# Patient Record
Sex: Male | Born: 1965 | Race: White | Hispanic: No | Marital: Married | Smoking: Never smoker
Health system: Southern US, Community
[De-identification: ages and names within clinical notes are randomized; demographics above are authoritative.]

## PROBLEM LIST (undated history)

## (undated) DIAGNOSIS — R945 Abnormal results of liver function studies: Secondary | ICD-10-CM

## (undated) DIAGNOSIS — J810 Acute pulmonary edema: Secondary | ICD-10-CM

## (undated) DIAGNOSIS — G4730 Sleep apnea, unspecified: Secondary | ICD-10-CM

## (undated) DIAGNOSIS — I10 Essential (primary) hypertension: Secondary | ICD-10-CM

## (undated) DIAGNOSIS — M1990 Unspecified osteoarthritis, unspecified site: Secondary | ICD-10-CM

## (undated) DIAGNOSIS — T7840XA Allergy, unspecified, initial encounter: Secondary | ICD-10-CM

## (undated) DIAGNOSIS — L03113 Cellulitis of right upper limb: Secondary | ICD-10-CM

## (undated) DIAGNOSIS — R0902 Hypoxemia: Secondary | ICD-10-CM

## (undated) DIAGNOSIS — E669 Obesity, unspecified: Secondary | ICD-10-CM

## (undated) HISTORY — PX: BUNIONECTOMY: SHX129

## (undated) HISTORY — DX: Cellulitis of right upper limb: L03.113

## (undated) HISTORY — DX: Sleep apnea, unspecified: G47.30

## (undated) HISTORY — DX: Abnormal results of liver function studies: R94.5

## (undated) HISTORY — DX: Essential (primary) hypertension: I10

## (undated) HISTORY — DX: Acute pulmonary edema: J81.0

## (undated) HISTORY — DX: Allergy, unspecified, initial encounter: T78.40XA

## (undated) HISTORY — DX: Hypoxemia: R09.02

---

## 2012-04-12 ENCOUNTER — Institutional Professional Consult (permissible substitution): Payer: Self-pay | Admitting: Pulmonary Disease

## 2012-05-03 ENCOUNTER — Ambulatory Visit (INDEPENDENT_AMBULATORY_CARE_PROVIDER_SITE_OTHER): Payer: BC Managed Care – PPO | Admitting: Pulmonary Disease

## 2012-05-03 ENCOUNTER — Encounter: Payer: Self-pay | Admitting: Pulmonary Disease

## 2012-05-03 VITALS — BP 124/76 | HR 87 | Temp 98.2°F | Ht 71.0 in | Wt 377.4 lb

## 2012-05-03 DIAGNOSIS — G4733 Obstructive sleep apnea (adult) (pediatric): Secondary | ICD-10-CM

## 2012-05-03 HISTORY — DX: Obstructive sleep apnea (adult) (pediatric): G47.33

## 2012-05-03 NOTE — Progress Notes (Signed)
Subjective:    Patient ID: Jeffery Simon, male    DOB: [birth year], 47 y.o.   MRN: [id number]  HPI The patient is a 47 year old male who I been asked to see for management of obstructive sleep apnea.  The patient has had a recent sleep study in [place] of this [place] which apparently showed severe disease.  The study is not available for my review currently.  He was started on CPAP, and he tells me that he is currently on the automatic setting.  He uses a full face mask, and has heated humidity.  He feels that his CPAP is very comfortable, and denies having any tolerance issues.  He also uses supplemental oxygen through his device while sleeping.  Since wearing CPAP, the patient states that his sleep is much improved, and he feels rested in the mornings upon arising.  His inappropriate daytime sleepiness is significantly improved, but has not totally resolved.  The patient denies any sleepiness while driving.  He states that his weight is down about 25 pounds over the last few months, but is up 40 pounds overall from 2 years ago.  His Epworth score today is only 3.  Sleep Questionnaire What time do you typically go to bed?( Between what hours) 9-10p 9-10p at 1535 on 05/03/12 by Nita Sells, CMA How long does it take you to fall asleep? 5-10 mins 5-10 mins at 1535 on 05/03/12 by Nita Sells, CMA How many times during the night do you wake up? 0 0 at 1535 on 05/03/12 by Nita Sells, CMA What time do you get out of bed to start your day? 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 (In pounds) 40 lb (18.144 kg)40 lb (18.144 kg) increase at 1535 on 05/03/12 by Nita Sells, CMA Have you ever had a sleep study before? Yes Yes at 1535 on 05/03/12 by Nita Sells, CMA If yes, location  of [facility]? [facility] at 1535 on 05/03/12 by Nita Sells, CMA If yes, date of study? 03/15/12 03/15/12 at 1535 on 05/03/12 by Nita Sells, CMA Do you currently use CPAP? Yes Yes at 1535 on 05/03/12 by Nita Sells, CMA If so, what pressure? btw 12-16--avg 14 btw 12-16--avg 14 at 1535 on 05/03/12 by Marjo Bicker Mabe, CMA Do you wear oxygen at any time? Yes Yes at 1535 on 05/03/12 by Marjo Bicker Mabe, CMA O2 Flow Rate (L/min) 3.5 L/min3.5 L/min bled into CPAP at 1535 on 05/03/12 by Marjo Bicker Mabe, CMA    Review of Systems  Constitutional: Negative for fever and unexpected weight change.  HENT: Negative for ear pain, nosebleeds, congestion, sore throat, rhinorrhea, sneezing, trouble swallowing, dental problem, postnasal drip and sinus pressure.   Eyes: Negative for redness and itching.  Respiratory: Negative for cough, chest tightness, shortness of breath and wheezing.   Cardiovascular: Negative for palpitations and leg swelling.  Gastrointestinal: Negative for nausea and vomiting.  Genitourinary: Negative for dysuria.  Musculoskeletal: Positive for joint swelling.  Skin: Negative for rash.  Neurological: Negative for headaches.  Hematological: Does not bruise/bleed easily.  Psychiatric/Behavioral: Negative for dysphoric mood. The patient is not nervous/anxious.        Objective:   Physical Exam Constitutional:  Morbidly obese male, no acute distress  HENT:  Nares patent without discharge  Oropharynx without exudate, palate and uvula are mildly elongated, large neck  Eyes:  Perrla, eomi, no scleral icterus  Neck:  No JVD, no TMG  Cardiovascular:  Normal rate, regular rhythm, no rubs or gallops.  No murmurs        Intact distal pulses  Pulmonary :  Normal breath sounds, no stridor or respiratory distress   No rales, rhonchi, or wheezing  Abdominal:  Soft, nondistended, bowel sounds present.  No tenderness noted.   Musculoskeletal:  1+ lower  extremity edema noted.  Lymph Nodes:  No cervical lymphadenopathy noted  Skin:  No cyanosis noted  Neurologic:  Alert, appropriate, moves all 4 extremities without obvious deficit.         Assessment & Plan:

## 2012-05-03 NOTE — Patient Instructions (Addendum)
Stay on cpap, keep up with mask changes and supplies. Work on weight loss Will check your oxygen level on cpap but off oxygen to see if you still need it at night while sleeping. followup with me in 6mos.

## 2012-05-03 NOTE — Assessment & Plan Note (Signed)
The patient has a history of significant sleep apnea from a recent sleep study, although the data is currently not available to me.  The patient has been started on CPAP with supplemental oxygen at night, and has seen significant improvement in his sleep and how he feels next day.  He tells me that he has also lost significant weight over the last few months since being on CPAP.  At this point, I have asked him to continue on CPAP, would like to get a download of his device to make sure that we are adequately controlling his sleep apnea, and we'll also check overnight oximetry on CPAP but off oxygen to make sure that he still requires this.  I have also encouraged him to work aggressively on weight loss, and to keep up with mask changes and supplies.

## 2012-05-09 ENCOUNTER — Encounter: Payer: Self-pay | Admitting: Pulmonary Disease

## 2012-05-09 ENCOUNTER — Telehealth: Payer: Self-pay | Admitting: *Deleted

## 2012-05-09 NOTE — Telephone Encounter (Signed)
LMOM x 1 

## 2012-05-09 NOTE — Telephone Encounter (Signed)
Please make sure with pt that ONO was done ON CPAP and OFF oxygen If so, let her know that her oxygen levels still fell, and will need to stay on oxygen thru her cpap machine.

## 2012-05-09 NOTE — Telephone Encounter (Signed)
ONO results have been received and placed in [facility] Brutus folder for review. Please advise, thanks.   

## 2012-05-10 NOTE — Telephone Encounter (Signed)
LMOM x 3  Due to patient being hard to contact--playing phone tag, please explain the results below (if I am unavailable to answer patients' call) I have been unable to reach him, Thanks.

## 2012-05-10 NOTE — Telephone Encounter (Signed)
Patient aware of results per [facility].  40mo recall has been entered for patient.

## 2012-05-10 NOTE — Telephone Encounter (Signed)
Patient wanted to know values from the sleep study... Lowest sat 55% Pt spent 2:12hr below 88%  Pt aware.

## 2012-05-10 NOTE — Telephone Encounter (Signed)
LMOM x 2 

## 2012-05-10 NOTE — Telephone Encounter (Signed)
Pt returned Jeffery Simon's call. [name]

## 2012-05-10 NOTE — Telephone Encounter (Signed)
Pt returned Ashtyn's call & can be reached at [phone].  Jeffery Simon

## 2012-05-21 ENCOUNTER — Encounter: Payer: Self-pay | Admitting: Pulmonary Disease

## 2012-06-05 ENCOUNTER — Encounter: Payer: Self-pay | Admitting: Pulmonary Disease

## 2014-12-25 ENCOUNTER — Other Ambulatory Visit: Payer: Self-pay | Admitting: Orthopedic Surgery

## 2014-12-25 DIAGNOSIS — M79671 Pain in right foot: Secondary | ICD-10-CM

## 2015-01-03 ENCOUNTER — Ambulatory Visit
Admission: RE | Admit: 2015-01-03 | Discharge: 2015-01-03 | Disposition: A | Discharge status: Home / Self Care | Payer: 59 | Source: Ambulatory Visit | Attending: Orthopedic Surgery | Admitting: Orthopedic Surgery

## 2015-01-03 DIAGNOSIS — M79671 Pain in right foot: Secondary | ICD-10-CM

## 2015-07-14 DIAGNOSIS — I10 Essential (primary) hypertension: Secondary | ICD-10-CM | POA: Diagnosis not present

## 2015-07-14 DIAGNOSIS — R945 Abnormal results of liver function studies: Secondary | ICD-10-CM | POA: Diagnosis not present

## 2015-07-25 DIAGNOSIS — R945 Abnormal results of liver function studies: Secondary | ICD-10-CM | POA: Diagnosis not present

## 2015-07-25 DIAGNOSIS — K760 Fatty (change of) liver, not elsewhere classified: Secondary | ICD-10-CM | POA: Diagnosis not present

## 2016-01-15 DIAGNOSIS — I868 Varicose veins of other specified sites: Secondary | ICD-10-CM | POA: Diagnosis not present

## 2016-01-15 DIAGNOSIS — I8312 Varicose veins of left lower extremity with inflammation: Secondary | ICD-10-CM | POA: Diagnosis not present

## 2016-01-15 DIAGNOSIS — I10 Essential (primary) hypertension: Secondary | ICD-10-CM | POA: Diagnosis not present

## 2016-01-15 DIAGNOSIS — J069 Acute upper respiratory infection, unspecified: Secondary | ICD-10-CM | POA: Diagnosis not present

## 2016-01-15 DIAGNOSIS — I83008 Varicose veins of unspecified lower extremity with ulcer other part of lower leg: Secondary | ICD-10-CM | POA: Diagnosis not present

## 2016-02-02 ENCOUNTER — Other Ambulatory Visit: Payer: Self-pay

## 2016-02-03 ENCOUNTER — Encounter: Payer: Self-pay | Admitting: Vascular Surgery

## 2016-02-03 ENCOUNTER — Other Ambulatory Visit: Payer: Self-pay | Admitting: *Deleted

## 2016-02-03 ENCOUNTER — Ambulatory Visit (INDEPENDENT_AMBULATORY_CARE_PROVIDER_SITE_OTHER): Payer: BLUE CROSS/BLUE SHIELD | Admitting: Vascular Surgery

## 2016-02-03 ENCOUNTER — Ambulatory Visit (HOSPITAL_COMMUNITY)
Admission: RE | Admit: 2016-02-03 | Discharge: 2016-02-03 | Disposition: A | Discharge status: Home / Self Care | Payer: BLUE CROSS/BLUE SHIELD | Source: Ambulatory Visit | Attending: Vascular Surgery | Admitting: Vascular Surgery

## 2016-02-03 VITALS — BP 176/104 | HR 84 | Temp 98.0°F | Resp 18 | Ht 71.0 in | Wt 386.6 lb

## 2016-02-03 DIAGNOSIS — I83892 Varicose veins of left lower extremities with other complications: Secondary | ICD-10-CM

## 2016-02-03 DIAGNOSIS — I83893 Varicose veins of bilateral lower extremities with other complications: Secondary | ICD-10-CM | POA: Diagnosis not present

## 2016-02-03 HISTORY — DX: Varicose veins of bilateral lower extremities with other complications: I83.893

## 2016-02-03 NOTE — Progress Notes (Signed)
Subjective:     Patient ID: Jeffery Simon, male   DOB: 04-11-1965, 51 y.o.   MRN: [id number]  HPI This 51 year old male was referred by Dr. Burnett Sheng for evaluation of bilateral varicose veins with a history of bleeding in the left ankle on 3 occasions. The patient has had 3 episodes of bleeding near the medial aspect of the left ankle where the skin is very thickened and eroded and he has small varicose veins. Most recent week before Christmas 2017 and he required a trip to the urgent care because of persistent oozing through the dressing. He has no history of DVT thrombophlebitis or stasis ulcers. He does have chronic edema in both ankles which worsens as the day progresses. He does try short leg elastic compression stockings frequently to assist in the swelling. His symptoms are affecting his daily living and ability to work.  Past Medical History:  Diagnosis Date  . HTN (hypertension)   . Sleep apnea     Social History  Substance Use Topics  . Smoking status: Never Smoker  . Smokeless tobacco: Never Used     Comment: cigars occ  . Alcohol use Yes     Comment: 2-3 beers monthly    Family History  Problem Relation Age of Onset  . Hypertension Mother   . Hypertension Father     Not on File   Current Outpatient Prescriptions:  .  diclofenac (VOLTAREN) 75 MG EC tablet, Take 75 mg by mouth 2 (two) times daily., Disp: , Rfl:  .  fish oil-omega-3 fatty acids 1000 MG capsule, Take 1 g by mouth daily., Disp: , Rfl:  .  GARLIC PO, Take 1 tablet by mouth daily., Disp: , Rfl:  .  hydrochlorothiazide (HYDRODIURIL) 25 MG tablet, Take 25 mg by mouth daily., Disp: , Rfl:  .  MULTIPLE VITAMIN PO, Take 1 tablet by mouth daily., Disp: , Rfl:   Vitals:   02/03/16 1003 02/03/16 1006  BP: (!) 160/104 (!) 176/104  Pulse: 84   Resp: 18   Temp: 98 F (36.7 C)   TempSrc: Oral   SpO2: 94%   Weight: (!) 386 lb 9.6 oz (175.4 kg)   Height: 5\' 11"  (1.803 m)     Body mass index is 53.92  kg/m.         Review of Systems Denies chest pain but does have dyspnea on exertion. Denies claudication. Able to ambulate long distances. Has chronic obesity and hypertension. Also has sleep apnea. Other systems negative and complete review of systems    Objective:   Physical Exam BP (!) 176/104 (BP Location: Left Arm, Patient Position: Sitting, Cuff Size: Normal) Comment: recheck  Pulse 84   Temp 98 F (36.7 C) (Oral)   Resp 18   Ht 5\' 11"  (1.803 m)   Wt (!) 386 lb 9.6 oz (175.4 kg)   SpO2 94%   BMI 53.92 kg/m     Gen.-alert and oriented x3 in no apparent distress-morbidly obese HEENT normal for age Lungs no rhonchi or wheezing Cardiovascular regular rhythm no murmurs carotid pulses 3+ palpable no bruits audible Abdomen soft nontender no palpable masses-morbidly obese Musculoskeletal free of  major deformities Skin clear -no rashes Neurologic normal Lower extremities 3+ femoral and dorsalis pedis pulses palpable bilaterally with bilateral 1+ edema. Hyperpigmentation medial aspect lower third left leg with erosion of the skin near reticular veins adjacent to left medial malleolus. Bulging varicosities medial calf and thigh bilaterally. No ulceration on the right side. 3+  dorsalis pedis pulse palpable bilaterally.  Today I performed a bedside SonoSite ultrasound exam which reveals gross reflux through enlarged great saphenous veins throughout  Formal venous duplex exam was also performed today which confirms the above findings with bilateral gross reflux great saphenous veins which are enlarged       Assessment:     Bilateral pain and swelling due to gross reflux great saphenous veins with history of bleeding left ankle varicosity on 3 occasions (most recently 2 weeks ago)-requiring trip to the emergency department Because of patient's morbid obesity unable to wear long leg elastic compression stockings-patient has worn short leg compression stockings    Plan:      Patient needs #1 laser ablation left great saphenous vein followed by #2 laser ablation right great saphenous vein  We will proceed with precertification to perform this in the near future-will request that insurance company wave waiting period for left leg because of multiple bleeding episodes

## 2016-02-09 ENCOUNTER — Encounter: Payer: 59 | Admitting: Vascular Surgery

## 2016-02-12 ENCOUNTER — Other Ambulatory Visit: Payer: Self-pay | Admitting: *Deleted

## 2016-02-12 DIAGNOSIS — I83893 Varicose veins of bilateral lower extremities with other complications: Secondary | ICD-10-CM

## 2016-02-16 ENCOUNTER — Encounter: Payer: Self-pay | Admitting: Vascular Surgery

## 2016-02-16 ENCOUNTER — Ambulatory Visit (INDEPENDENT_AMBULATORY_CARE_PROVIDER_SITE_OTHER): Payer: BLUE CROSS/BLUE SHIELD | Admitting: Vascular Surgery

## 2016-02-16 VITALS — BP 149/87 | HR 83 | Resp 18 | Ht 69.0 in | Wt 385.0 lb

## 2016-02-16 DIAGNOSIS — I83892 Varicose veins of left lower extremities with other complications: Secondary | ICD-10-CM

## 2016-02-16 NOTE — Progress Notes (Signed)
Vitals:   02/16/16 1403  BP: (!) 145/90  Pulse: 83  Resp: 18  SpO2: 91%  Weight: (!) 385 lb (174.6 kg)  Height: 5\' 9"  (1.753 m)

## 2016-02-16 NOTE — Progress Notes (Signed)
Laser Ablation Procedure    Date: 02/16/2016   Jeffery Simon DOB:[birth year]  Consent signed: Yes    Surgeon:  Dr. Nelda Severe. [name]  Procedure: Laser Ablation: left Greater Saphenous Vein  BP (!) 149/87 (BP Location: Left Arm, Patient Position: Sitting, Cuff Size: Large)   Pulse 83   Resp 18   Ht 5\' 9"  (1.753 m)   Wt (!) 385 lb (174.6 kg)   SpO2 91%   BMI 56.85 kg/m   Tumescent Anesthesia: 425 cc 0.9% NaCl with 50 cc Lidocaine HCL with 1% Epi and 15 cc 8.4% NaHCO3  Local Anesthesia: 6 cc Lidocaine HCL and NaHCO3 (ratio 2:1)  Pulsed Mode: 15 watts, 530ms delay, 1.0 duration  Total Energy: 2575             Total Pulses:  173              Total Time: 2:52    Patient tolerated procedure well  Notes:   Description of Procedure:  After marking the course of the secondary varicosities, the patient was placed on the operating table in the supine position, and the left leg was prepped and draped in sterile fashion.   Local anesthetic was administered and under ultrasound guidance the saphenous vein was accessed with a micro needle and guide wire; then the mirco puncture sheath was placed.  A guide wire was inserted saphenofemoral junction , followed by a 5 french sheath.  The position of the sheath and then the laser fiber below the junction was confirmed using the ultrasound.  Tumescent anesthesia was administered along the course of the saphenous vein using ultrasound guidance. The patient was placed in Trendelenburg position and protective laser glasses were placed on patient and staff, and the laser was fired at 15 watts continuous mode advancing 1-78mm/second for a total of 2575 joules.     Steri strips were applied to the stab wounds and ABD pads and thigh high compression stockings were applied.  Ace wrap bandages were applied over the phlebectomy sites and at the top of the saphenofemoral junction. Blood loss was less than 15 cc.  The patient ambulated out of the operating room  having tolerated the procedure well.

## 2016-02-16 NOTE — Progress Notes (Signed)
Subjective:     Patient ID: Jeffery Simon, male   DOB: [birth year], 51 y.o.   MRN: [id number]  HPI This 51 year old male had laser ablation left great saphenous vein from the proximal calf to near the saphenofemoral junction performed under local tumescent anesthesia. A total of 2575 J of energy was utilized. He tolerated the procedure well.  Review of Systems     Objective:   Physical Exam BP (!) 149/87 (BP Location: Left Arm, Patient Position: Sitting, Cuff Size: Large)   Pulse 83   Resp 18   Ht 5\' 9"  (1.753 m)   Wt (!) 385 lb (174.6 kg)   SpO2 91%   BMI 56.85 kg/m        Assessment:     Well-tolerated laser ablation left great saphenous vein performed under local tumescent anesthesia    Plan:     Return in 1 week for venous duplex exam to confirm closure left great saphenous vein Patient will then be scheduled for similar procedure and contralateral right leg

## 2016-02-17 ENCOUNTER — Encounter: Payer: Self-pay | Admitting: Vascular Surgery

## 2016-02-18 ENCOUNTER — Other Ambulatory Visit: Payer: Self-pay | Admitting: *Deleted

## 2016-02-18 DIAGNOSIS — I83811 Varicose veins of right lower extremities with pain: Secondary | ICD-10-CM

## 2016-02-24 ENCOUNTER — Ambulatory Visit (HOSPITAL_COMMUNITY)
Admission: RE | Admit: 2016-02-24 | Discharge: 2016-02-24 | Disposition: A | Discharge status: Home / Self Care | Payer: BLUE CROSS/BLUE SHIELD | Source: Ambulatory Visit | Attending: Vascular Surgery | Admitting: Vascular Surgery

## 2016-02-24 ENCOUNTER — Encounter: Payer: Self-pay | Admitting: Vascular Surgery

## 2016-02-24 ENCOUNTER — Ambulatory Visit (INDEPENDENT_AMBULATORY_CARE_PROVIDER_SITE_OTHER): Payer: BLUE CROSS/BLUE SHIELD | Admitting: Vascular Surgery

## 2016-02-24 VITALS — BP 142/79 | HR 81 | Temp 98.2°F | Resp 18

## 2016-02-24 DIAGNOSIS — I83893 Varicose veins of bilateral lower extremities with other complications: Secondary | ICD-10-CM | POA: Insufficient documentation

## 2016-02-24 NOTE — Progress Notes (Signed)
Subjective:     Patient ID: Jeffery Simon, male   DOB: 13-Mar-1965, 51 y.o.   MRN: [id number]  HPI This 51 year old male returns 1 week post-laser ablation left great saphenous vein for a large great saphenous vein with gross reflux and venous stasis ulcers with bleeding. The last bleeding episode was the week before Christmas--4 weeks ago. He states that his left leg has had less swelling since the procedure and feels much lighter than the contralateral right leg. He has worn his elastic compression stocking as instructed. He did develop a slight elastic irritation in the proximal left medial thigh.  Past Medical History:  Diagnosis Date  . HTN (hypertension)   . Sleep apnea     Social History  Substance Use Topics  . Smoking status: Never Smoker  . Smokeless tobacco: Never Used     Comment: cigars occ  . Alcohol use Yes     Comment: 2-3 beers monthly    Family History  Problem Relation Age of Onset  . Hypertension Mother   . Hypertension Father     No Known Allergies   Current Outpatient Prescriptions:  .  hydrochlorothiazide (HYDRODIURIL) 25 MG tablet, TAKE 1 TABLET BY MOUTH DAILY, Disp: , Rfl:  .  diclofenac (VOLTAREN) 75 MG EC tablet, Take 75 mg by mouth 2 (two) times daily., Disp: , Rfl:  .  fish oil-omega-3 fatty acids 1000 MG capsule, Take 1 g by mouth daily., Disp: , Rfl:  .  GARLIC PO, Take 1 tablet by mouth daily., Disp: , Rfl:  .  GARLIC PO, Take by mouth., Disp: , Rfl:  .  hydrochlorothiazide (HYDRODIURIL) 25 MG tablet, Take 25 mg by mouth daily., Disp: , Rfl:  .  Multiple Vitamin (MULTIVITAMIN) tablet, Take by mouth., Disp: , Rfl:  .  MULTIPLE VITAMIN PO, Take 1 tablet by mouth daily., Disp: , Rfl:  .  Omega-3 Fatty Acids (FISH OIL) 1000 MG CAPS, Take by mouth., Disp: , Rfl:   Vitals:   02/24/16 1126  BP: (!) 142/79  Pulse: 81  Resp: 18  Temp: 98.2 F (36.8 C)  SpO2: 98%    There is no height or weight on file to calculate BMI.         Review  of Systems Denies chest pain, dyspnea on exertion, PND, orthopnea, hemoptysis, claudication    Objective:   Physical Exam BP (!) 142/79 (BP Location: Left Arm, Patient Position: Sitting, Cuff Size: Large)   Pulse 81   Temp 98.2 F (36.8 C)   Resp 18   SpO2 98%   Gen. obese male in no apparent distress alert and oriented 3 Lungs no rhonchi or wheezing Cardiovascular regular rhythm no murmurs Left leg with decrease lower extremity edema below the knee since prior to the procedure. No bulging varicosities noted. Ulcerations on the medial aspect of left ankle have become drier with no drainage. Mild discomfort to deep palpation over great saphenous vein in thigh area.  Today I ordered a venous duplex exam of the left leg which I reviewed and interpreted. There is no DVT. There is total closure of the left great saphenous vein from the proximal calf up to near the saphenofemoral junction.     Assessment:     Successful laser ablation left great saphenous vein for gross reflux with stasis ulcers and bleeding with distal edema-good early result    Plan:     Patient to return in March 13 for similar procedure and contralateral right leg

## 2016-03-26 ENCOUNTER — Encounter: Payer: Self-pay | Admitting: Vascular Surgery

## 2016-04-02 ENCOUNTER — Encounter: Payer: Self-pay | Admitting: Vascular Surgery

## 2016-04-06 ENCOUNTER — Ambulatory Visit (INDEPENDENT_AMBULATORY_CARE_PROVIDER_SITE_OTHER): Payer: BLUE CROSS/BLUE SHIELD | Admitting: Vascular Surgery

## 2016-04-06 ENCOUNTER — Encounter: Payer: Self-pay | Admitting: Vascular Surgery

## 2016-04-06 VITALS — BP 131/84 | HR 80 | Temp 97.3°F | Resp 16 | Ht 71.0 in | Wt 389.0 lb

## 2016-04-06 DIAGNOSIS — I83893 Varicose veins of bilateral lower extremities with other complications: Secondary | ICD-10-CM

## 2016-04-06 HISTORY — PX: ENDOVENOUS ABLATION SAPHENOUS VEIN W/ LASER: SUR449

## 2016-04-06 NOTE — Progress Notes (Signed)
Laser Ablation Procedure    Date: 04/06/2016   Jeffery Simon DOB:Apr 10, 1965  Consent signed: Yes    Surgeon:  Dr. Nelda Severe. [name]  Procedure: Laser Ablation: right Greater Saphenous Vein  BP 131/84 (BP Location: Left Arm, Patient Position: Sitting, Cuff Size: Large)   Pulse 80   Temp 97.3 F (36.3 C) (Oral)   Resp 16   Ht 5\' 11"  (1.803 m)   Wt (!) 389 lb (176.4 kg)   SpO2 91%   BMI 54.25 kg/m   Tumescent Anesthesia: 475 cc 0.9% NaCl with 50 cc Lidocaine HCL with 1% Epi and 15 cc 8.4% NaHCO3  Local Anesthesia: 4 cc Lidocaine HCL and NaHCO3 (ratio 2:1)  Pulsed Mode: 15 watts, 535ms delay, 1.0 duration  Total Energy:  2634 Joules            Total Pulses:   176             Total Time: 2:56      Patient tolerated procedure well    Description of Procedure:  After marking the course of the secondary varicosities, the patient was placed on the operating table in the supine position, and the right leg was prepped and draped in sterile fashion.   Local anesthetic was administered and under ultrasound guidance the saphenous vein was accessed with a micro needle and guide wire; then the mirco puncture sheath was placed.  A guide wire was inserted saphenofemoral junction , followed by a 5 french sheath.  The position of the sheath and then the laser fiber below the junction was confirmed using the ultrasound.  Tumescent anesthesia was administered along the course of the saphenous vein using ultrasound guidance. The patient was placed in Trendelenburg position and protective laser glasses were placed on patient and staff, and the laser was fired at 15 watts continuous mode advancing 1-42mm/second for a total of 2634 joules.      Steri strip was applied to insertion site and ABD pads and thigh high compression stockings were applied.  Ace wrap bandages were applied to right leg including the   top of the saphenofemoral junction. Blood loss was less than 15 cc.  The patient ambulated out  of the operating room having tolerated the procedure well.

## 2016-04-06 NOTE — Progress Notes (Signed)
Subjective:     Patient ID: Jeffery Simon, male   DOB: 1965/04/21, 51 y.o.   MRN: [id number]  HPI This 51 year old male had laser ablation of the right great saphenous vein from the proximal calf to near the saphenofemoral junction performed under local tumescent anesthesia. A total of 2634 J of energy was utilized. He tolerated the procedure well. Review of Systems     Objective:   Physical Exam BP 131/84 (BP Location: Left Arm, Patient Position: Sitting, Cuff Size: Large)   Pulse 80   Temp 97.3 F (36.3 C) (Oral)   Resp 16   Ht 5\' 11"  (1.803 m)   Wt (!) 389 lb (176.4 kg)   SpO2 91%   BMI 54.25 kg/m        Assessment:     Well-tolerated laser ablation right great saphenous vein from proximal calf to near the saphenofemoral junction performed under local tumescent anesthesia for gross reflux with pain and swelling    Plan:     Return in 1 week for venous duplex exam to confirm closure right great saphenous vein and this will complete patient's treatment regimen

## 2016-04-13 ENCOUNTER — Encounter: Payer: Self-pay | Admitting: Vascular Surgery

## 2016-04-13 ENCOUNTER — Ambulatory Visit (INDEPENDENT_AMBULATORY_CARE_PROVIDER_SITE_OTHER): Payer: BLUE CROSS/BLUE SHIELD | Admitting: Vascular Surgery

## 2016-04-13 ENCOUNTER — Ambulatory Visit (HOSPITAL_COMMUNITY)
Admission: RE | Admit: 2016-04-13 | Discharge: 2016-04-13 | Disposition: A | Discharge status: Home / Self Care | Payer: BLUE CROSS/BLUE SHIELD | Source: Ambulatory Visit | Attending: Vascular Surgery | Admitting: Vascular Surgery

## 2016-04-13 VITALS — BP 148/98 | HR 76 | Temp 98.5°F | Resp 18 | Ht 71.0 in | Wt 389.0 lb

## 2016-04-13 DIAGNOSIS — I83893 Varicose veins of bilateral lower extremities with other complications: Secondary | ICD-10-CM | POA: Diagnosis not present

## 2016-04-13 DIAGNOSIS — I83811 Varicose veins of right lower extremities with pain: Secondary | ICD-10-CM | POA: Insufficient documentation

## 2016-04-13 NOTE — Progress Notes (Signed)
Subjective:     Patient ID: Jeffery Simon, male   DOB: 07-18-1965, 51 y.o.   MRN: [id number]  HPI This 51 year old obese male returns 1 week post-laser ablation right great saphenous vein for chronic swelling and skin changes secondary to gross reflux in the right great saphenous system. He also had procedure on the left leg a few weeks earlier. Experienced 2 or 3 episodes of spontaneous bleeding on the left side prior to the procedure and has had no recurrent bleeding to date. He has worn his long leg compression stockings as instructed and taken ibuprofen.  Past Medical History:  Diagnosis Date  . HTN (hypertension)   . Sleep apnea     Social History  Substance Use Topics  . Smoking status: Never Smoker  . Smokeless tobacco: Never Used     Comment: cigars occ  . Alcohol use Yes     Comment: 2-3 beers monthly    Family History  Problem Relation Age of Onset  . Hypertension Mother   . Hypertension Father     No Known Allergies   Current Outpatient Prescriptions:  .  diclofenac (VOLTAREN) 75 MG EC tablet, Take 75 mg by mouth 2 (two) times daily., Disp: , Rfl:  .  fish oil-omega-3 fatty acids 1000 MG capsule, Take 1 g by mouth daily., Disp: , Rfl:  .  GARLIC PO, Take 1 tablet by mouth daily., Disp: , Rfl:  .  hydrochlorothiazide (HYDRODIURIL) 25 MG tablet, Take 25 mg by mouth daily., Disp: , Rfl:  .  hydrochlorothiazide (HYDRODIURIL) 25 MG tablet, TAKE 1 TABLET BY MOUTH DAILY, Disp: , Rfl:  .  Multiple Vitamin (MULTIVITAMIN) tablet, Take by mouth., Disp: , Rfl:  .  MULTIPLE VITAMIN PO, Take 1 tablet by mouth daily., Disp: , Rfl:  .  Omega-3 Fatty Acids (FISH OIL) 1000 MG CAPS, Take by mouth., Disp: , Rfl:  .  GARLIC PO, Take by mouth., Disp: , Rfl:   Vitals:   04/13/16 1125  BP: (!) 148/98  Pulse: 76  Resp: 18  Temp: 98.5 F (36.9 C)  TempSrc: Oral  SpO2: 92%  Weight: (!) 389 lb (176.4 kg)  Height: 5\' 11"  (1.803 m)    Body mass index is 54.25  kg/m.        Review of Systems As chest pain, dyspnea on exertion, PND, orthopnea, hemoptysis    Objective:   Physical Exam BP (!) 148/98 (BP Location: Left Arm, Patient Position: Sitting, Cuff Size: Large)   Pulse 76   Temp 98.5 F (36.9 C) (Oral)   Resp 18   Ht 5\' 11"  (1.803 m)   Wt (!) 389 lb (176.4 kg)   SpO2 92%   BMI 54.25 kg/m   [place] obese male no apparent distress alert and oriented 3 Lungs no rhonchi or wheezing Right leg with mild to moderate discomfort to deep palpation over great saphenous vein. 1+ distal edema. 3+ dorsalis pedis pulse palpable.  Today I ordered a venous reflux exam of the right leg which I reviewed and interpreted. There is no DVT. There is closure of the right great saphenous vein up to near the saphenofemoral junction     Assessment:     Successful bilateral laser ablation great saphenous veins for patient with chronic swelling and skin changes    Plan:     Return to see me on a when necessary basis Recommended continuing to wear short leg elastic compression stockings 20-30 millimeter gradient

## 2016-04-24 ENCOUNTER — Encounter (HOSPITAL_COMMUNITY): Payer: Self-pay | Admitting: *Deleted

## 2016-04-24 ENCOUNTER — Inpatient Hospital Stay (HOSPITAL_COMMUNITY)
Admission: EM | Admit: 2016-04-24 | Discharge: 2016-04-27 | DRG: 571 | Disposition: A | Discharge status: Home Health | Payer: BLUE CROSS/BLUE SHIELD | Attending: Family Medicine | Admitting: Family Medicine

## 2016-04-24 ENCOUNTER — Observation Stay (HOSPITAL_COMMUNITY): Payer: BLUE CROSS/BLUE SHIELD

## 2016-04-24 ENCOUNTER — Emergency Department (HOSPITAL_COMMUNITY): Payer: BLUE CROSS/BLUE SHIELD

## 2016-04-24 DIAGNOSIS — L02416 Cutaneous abscess of left lower limb: Secondary | ICD-10-CM | POA: Diagnosis not present

## 2016-04-24 DIAGNOSIS — L03116 Cellulitis of left lower limb: Secondary | ICD-10-CM | POA: Diagnosis not present

## 2016-04-24 DIAGNOSIS — R7401 Elevation of levels of liver transaminase levels: Secondary | ICD-10-CM

## 2016-04-24 DIAGNOSIS — Z6841 Body Mass Index (BMI) 40.0 and over, adult: Secondary | ICD-10-CM | POA: Diagnosis not present

## 2016-04-24 DIAGNOSIS — G4733 Obstructive sleep apnea (adult) (pediatric): Secondary | ICD-10-CM | POA: Diagnosis present

## 2016-04-24 DIAGNOSIS — K760 Fatty (change of) liver, not elsewhere classified: Secondary | ICD-10-CM | POA: Diagnosis present

## 2016-04-24 DIAGNOSIS — M79609 Pain in unspecified limb: Secondary | ICD-10-CM | POA: Diagnosis not present

## 2016-04-24 DIAGNOSIS — I10 Essential (primary) hypertension: Secondary | ICD-10-CM | POA: Diagnosis present

## 2016-04-24 DIAGNOSIS — Z8249 Family history of ischemic heart disease and other diseases of the circulatory system: Secondary | ICD-10-CM

## 2016-04-24 DIAGNOSIS — L02419 Cutaneous abscess of limb, unspecified: Secondary | ICD-10-CM

## 2016-04-24 DIAGNOSIS — R740 Nonspecific elevation of levels of transaminase and lactic acid dehydrogenase [LDH]: Secondary | ICD-10-CM

## 2016-04-24 DIAGNOSIS — M25572 Pain in left ankle and joints of left foot: Secondary | ICD-10-CM | POA: Diagnosis not present

## 2016-04-24 DIAGNOSIS — I270 Primary pulmonary hypertension: Secondary | ICD-10-CM | POA: Diagnosis not present

## 2016-04-24 DIAGNOSIS — I83893 Varicose veins of bilateral lower extremities with other complications: Secondary | ICD-10-CM | POA: Diagnosis present

## 2016-04-24 DIAGNOSIS — R600 Localized edema: Secondary | ICD-10-CM | POA: Diagnosis not present

## 2016-04-24 DIAGNOSIS — B9562 Methicillin resistant Staphylococcus aureus infection as the cause of diseases classified elsewhere: Secondary | ICD-10-CM | POA: Diagnosis not present

## 2016-04-24 DIAGNOSIS — M7989 Other specified soft tissue disorders: Secondary | ICD-10-CM | POA: Diagnosis not present

## 2016-04-24 HISTORY — DX: Morbid (severe) obesity due to excess calories: E66.01

## 2016-04-24 HISTORY — DX: Obesity, unspecified: E66.9

## 2016-04-24 HISTORY — DX: Elevation of levels of liver transaminase levels: R74.01

## 2016-04-24 HISTORY — DX: Cellulitis of left lower limb: L03.116

## 2016-04-24 HISTORY — DX: Essential (primary) hypertension: I10

## 2016-04-24 LAB — CBC WITH DIFFERENTIAL/PLATELET
BASOS ABS: 0 10*3/uL (ref 0.0–0.1)
Basophils Relative: 0 %
EOS PCT: 5 %
Eosinophils Absolute: 0.5 10*3/uL (ref 0.0–0.7)
HCT: 37.2 % — ABNORMAL LOW (ref 39.0–52.0)
Hemoglobin: 12.2 g/dL — ABNORMAL LOW (ref 13.0–17.0)
LYMPHS ABS: 1.7 10*3/uL (ref 0.7–4.0)
LYMPHS PCT: 17 %
MCH: 30.9 pg (ref 26.0–34.0)
MCHC: 32.8 g/dL (ref 30.0–36.0)
MCV: 94.2 fL (ref 78.0–100.0)
Monocytes Absolute: 0.8 10*3/uL (ref 0.1–1.0)
Monocytes Relative: 8 %
NEUTROS PCT: 70 %
Neutro Abs: 6.9 10*3/uL (ref 1.7–7.7)
PLATELETS: 241 10*3/uL (ref 150–400)
RBC: 3.95 MIL/uL — ABNORMAL LOW (ref 4.22–5.81)
RDW: 13.1 % (ref 11.5–15.5)
WBC: 9.9 10*3/uL (ref 4.0–10.5)

## 2016-04-24 LAB — COMPREHENSIVE METABOLIC PANEL
ALT: 113 U/L — ABNORMAL HIGH (ref 17–63)
AST: 57 U/L — ABNORMAL HIGH (ref 15–41)
Albumin: 3.5 g/dL (ref 3.5–5.0)
Alkaline Phosphatase: 106 U/L (ref 38–126)
Anion gap: 6 (ref 5–15)
BILIRUBIN TOTAL: 0.9 mg/dL (ref 0.3–1.2)
BUN: 13 mg/dL (ref 6–20)
CHLORIDE: 99 mmol/L — AB (ref 101–111)
CO2: 32 mmol/L (ref 22–32)
Calcium: 9.2 mg/dL (ref 8.9–10.3)
Creatinine, Ser: 0.71 mg/dL (ref 0.61–1.24)
Glucose, Bld: 104 mg/dL — ABNORMAL HIGH (ref 65–99)
POTASSIUM: 4.2 mmol/L (ref 3.5–5.1)
Sodium: 137 mmol/L (ref 135–145)
TOTAL PROTEIN: 7.3 g/dL (ref 6.5–8.1)

## 2016-04-24 LAB — MAGNESIUM: Magnesium: 2.2 mg/dL (ref 1.7–2.4)

## 2016-04-24 MED ORDER — HYDROCHLOROTHIAZIDE 25 MG PO TABS
25.0000 mg | ORAL_TABLET | Freq: Every day | ORAL | Status: DC
  Administered 2016-04-25 – 2016-04-27 (×3): 25 mg via ORAL
  Filled 2016-04-24 (×3): qty 1

## 2016-04-24 MED ORDER — VANCOMYCIN HCL 10 G IV SOLR
1500.0000 mg | Freq: Once | INTRAVENOUS | Status: AC
  Administered 2016-04-24: 1500 mg via INTRAVENOUS
  Filled 2016-04-24: qty 1500

## 2016-04-24 MED ORDER — ACETAMINOPHEN 325 MG PO TABS: 650.0000 mg | ORAL_TABLET | Freq: Four times a day (QID) | ORAL | Status: DC | PRN

## 2016-04-24 MED ORDER — MUPIROCIN 2 % EX OINT
1.0000 "application " | TOPICAL_OINTMENT | Freq: Two times a day (BID) | CUTANEOUS | Status: DC
  Administered 2016-04-25 – 2016-04-27 (×5): 1 via NASAL
  Filled 2016-04-24 (×2): qty 22

## 2016-04-24 MED ORDER — IBUPROFEN 200 MG PO TABS
400.0000 mg | ORAL_TABLET | Freq: Four times a day (QID) | ORAL | Status: DC | PRN
  Administered 2016-04-25: 400 mg via ORAL
  Filled 2016-04-24: qty 2

## 2016-04-24 MED ORDER — VANCOMYCIN HCL 10 G IV SOLR
1500.0000 mg | Freq: Two times a day (BID) | INTRAVENOUS | Status: DC
  Administered 2016-04-24 – 2016-04-27 (×6): 1500 mg via INTRAVENOUS
  Filled 2016-04-24 (×7): qty 1500

## 2016-04-24 MED ORDER — CHLORHEXIDINE GLUCONATE 4 % EX LIQD
60.0000 mL | Freq: Once | CUTANEOUS | Status: AC
  Administered 2016-04-24: 4 via TOPICAL
  Filled 2016-04-24: qty 15

## 2016-04-24 MED ORDER — VANCOMYCIN HCL IN DEXTROSE 1-5 GM/200ML-% IV SOLN: 1000.0000 mg | Freq: Once | INTRAVENOUS | Status: DC

## 2016-04-24 MED ORDER — DEXTROSE 5 % IV SOLN
1.0000 g | Freq: Once | INTRAVENOUS | Status: AC
  Administered 2016-04-24: 1 g via INTRAVENOUS
  Filled 2016-04-24: qty 10

## 2016-04-24 MED ORDER — ONDANSETRON HCL 4 MG PO TABS: 4.0000 mg | ORAL_TABLET | Freq: Four times a day (QID) | ORAL | Status: DC | PRN

## 2016-04-24 MED ORDER — OMEGA-3-ACID ETHYL ESTERS 1 G PO CAPS
1.0000 g | ORAL_CAPSULE | Freq: Every day | ORAL | Status: DC
  Administered 2016-04-25 – 2016-04-27 (×3): 1 g via ORAL
  Filled 2016-04-24 (×3): qty 1

## 2016-04-24 MED ORDER — ONDANSETRON HCL 4 MG/2ML IJ SOLN: 4.0000 mg | Freq: Four times a day (QID) | INTRAMUSCULAR | Status: DC | PRN

## 2016-04-24 MED ORDER — OMEGA-3 FATTY ACIDS 1000 MG PO CAPS: 1.0000 g | ORAL_CAPSULE | Freq: Every day | ORAL | Status: DC

## 2016-04-24 MED ORDER — ACETAMINOPHEN 650 MG RE SUPP: 650.0000 mg | Freq: Four times a day (QID) | RECTAL | Status: DC | PRN

## 2016-04-24 MED ORDER — CHLORHEXIDINE GLUCONATE CLOTH 2 % EX PADS
6.0000 | MEDICATED_PAD | Freq: Every day | CUTANEOUS | Status: DC
  Administered 2016-04-26 – 2016-04-27 (×2): 6 via TOPICAL

## 2016-04-24 MED ORDER — DICLOFENAC SODIUM 75 MG PO TBEC
150.0000 mg | DELAYED_RELEASE_TABLET | Freq: Every day | ORAL | Status: DC
  Administered 2016-04-25 – 2016-04-27 (×3): 150 mg via ORAL
  Filled 2016-04-24 (×3): qty 2

## 2016-04-24 MED ORDER — ENOXAPARIN SODIUM 40 MG/0.4ML ~~LOC~~ SOLN
40.0000 mg | SUBCUTANEOUS | Status: DC
  Administered 2016-04-24: 40 mg via SUBCUTANEOUS
  Filled 2016-04-24: qty 0.4

## 2016-04-24 NOTE — ED Provider Notes (Signed)
[facility] DEPT Provider Note   CSN: [id number] Arrival date & time: 04/24/16  1229     History   Chief Complaint Chief Complaint  Patient presents with  . Leg Swelling    HPI GLENDALE YOUNGBLOOD is a 51 y.o. male.  HPI Patient presents emergency department with increasing left lower extremity swelling and erythema with new drainage at the left medial ankle.  He does report that in January he had laser vein surgery for varicose vein by the vascular surgery team.  No history of diabetes.  He just noticed new drainage coming out of his left medial malleolus today.  No significant pain with range of motion of his left ankle joint.  Spreading erythema noted and was sent to the emergency department for further evaluation.   Past Medical History:  Diagnosis Date  . HTN (hypertension)   . Obesity   . Sleep apnea     Patient Active Problem List   Diagnosis Date Noted  . Varicose veins of bilateral lower extremities with other complications 40/98/1191  . OSA (obstructive sleep apnea) 05/03/2012    Past Surgical History:  Procedure Laterality Date  . ENDOVENOUS ABLATION SAPHENOUS VEIN W/ LASER Right 04/06/2016   endovenous laser ablation right greater saphenous vein by Tinnie Gens MD   . NO PAST SURGERIES         Home Medications    Prior to Admission medications   Medication Sig Start Date End Date Taking? Authorizing Provider  diclofenac (VOLTAREN) 75 MG EC tablet Take 150 mg by mouth daily.    Yes Historical Provider, MD  fish oil-omega-3 fatty acids 1000 MG capsule Take 1 g by mouth daily.   Yes Historical Provider, MD  GARLIC PO Take 1 tablet by mouth daily.   Yes Historical Provider, MD  hydrochlorothiazide (HYDRODIURIL) 25 MG tablet Take 25 mg by mouth daily.   Yes Historical Provider, MD  ibuprofen (ADVIL,MOTRIN) 200 MG tablet Take 400 mg by mouth every 6 (six) hours as needed for headache or moderate pain.   Yes Historical Provider, MD  MULTIPLE VITAMIN PO Take  1 tablet by mouth daily.   Yes Historical Provider, MD    Family History Family History  Problem Relation Age of Onset  . Hypertension Mother   . Hypertension Father     Social History Social History  Substance Use Topics  . Smoking status: Never Smoker  . Smokeless tobacco: Never Used     Comment: cigars occ  . Alcohol use Yes     Comment: 2-3 beers monthly     Allergies   Patient has no known allergies.   Review of Systems Review of Systems  All other systems reviewed and are negative.    Physical Exam Updated Vital Signs BP (!) 150/76 (BP Location: Right Arm)   Pulse 79   Temp 98.2 F (36.8 C) (Oral)   Resp 18   SpO2 93%   Physical Exam  Constitutional: He is oriented to person, place, and time. He appears well-developed and well-nourished.  HENT:  Head: Normocephalic.  Eyes: EOM are normal.  Neck: Normal range of motion.  Pulmonary/Chest: Effort normal.  Abdominal: He exhibits no distension.  Musculoskeletal:  Normal PT and DP pulses in left foot.  Significant spreading erythema and nearly circumferential erythema of his left lower extremity up to the level of the proximal tibia.  There is tenderness and erythema and a small amount of fluctuance of the left medial malleolus with purulent drainage.  Neurological:  He is alert and oriented to person, place, and time.  Psychiatric: He has a normal mood and affect.  Nursing note and vitals reviewed.    ED Treatments / Results  Labs (all labs ordered are listed, but only abnormal results are displayed) Labs Reviewed  COMPREHENSIVE METABOLIC PANEL - Abnormal; Notable for the following:       Result Value   Chloride 99 (*)    Glucose, Bld 104 (*)    AST 57 (*)    ALT 113 (*)    All other components within normal limits  CBC WITH DIFFERENTIAL/PLATELET - Abnormal; Notable for the following:    RBC 3.95 (*)    Hemoglobin 12.2 (*)    HCT 37.2 (*)    All other components within normal limits  CULTURE,  BLOOD (ROUTINE X 2)  CULTURE, BLOOD (ROUTINE X 2)    EKG  EKG Interpretation None       Radiology Dg Ankle Complete Left  Result Date: 04/24/2016 CLINICAL DATA:  Left lower leg swelling, redness, and drainage. Cellulitis. Vein surgery 2 months ago. EXAM: LEFT ANKLE COMPLETE - 3+ VIEW COMPARISON:  None. FINDINGS: The soft tissue swelling and subcutaneous fat reticulation are present throughout the visualized lower leg, ankle, and dorsum of the foot. No soft tissue emphysema is identified. No acute fracture or dislocation is seen. Mild degenerative spurring is noted in the midfoot, and there are small posterior and moderate plantar calcaneal enthesophytes. No radiopaque foreign body. IMPRESSION: Diffuse cellulitic type changes without evidence of soft tissue emphysema or acute osseous abnormality. Electronically Signed   By: Logan Bores M.D.   On: 04/24/2016 13:58    Procedures Procedures (including critical care time)  Medications Ordered in ED Medications  vancomycin (VANCOCIN) 1,500 mg in sodium chloride 0.9 % 500 mL IVPB (1,500 mg Intravenous New Bag/Given 04/24/16 1525)  cefTRIAXone (ROCEPHIN) 1 g in dextrose 5 % 50 mL IVPB (0 g Intravenous Stopped 04/24/16 1520)     Initial Impression / Assessment and Plan / ED Course  I have reviewed the triage vital signs and the nursing notes.  Pertinent labs & imaging results that were available during my care of the patient were reviewed by me and considered in my medical decision making (see chart for details).     Patient be admitted the hospital for IV antibiotics for apparent cellulitis with now draining abscess of his left medial malleolus.  He has full range of motion of left joint.  No air noted on x-ray.  Feel that the patient benefit from 24 hours of IV antibiotics.  Vancomycin and Rocephin given in the emergency department  Final Clinical Impressions(s) / ED Diagnoses   Final diagnoses:  Cellulitis of left lower extremity     New Prescriptions New Prescriptions   No medications on file     Jola Schmidt, MD 04/24/16 1601

## 2016-04-24 NOTE — H&P (Signed)
History and Physical    Jeffery Simon DOB: 22-Mar-1965 DOA: 04/24/2016   PCP: Greig Right, MD   Patient coming from/Resides with: Private residence/lives w/ wife  Admission status: Observation/floor- it may be medically necessary to stay a minimum 2 midnights to rule out impending and/or unexpected changes in physiologic status that may differ from initial evaluation performed in the ER and/or at time of admission therefore consider re evaluation of admission status in 24 hrs.   Chief Complaint: Swelling and purulent drainage left leg  HPI: Jeffery Simon is a 51 y.o. male with medical history significant for HTN, severe OSA on CPAP, morbid obesity. Patient has been followed by Dr. Lawson/[facility] for bilateral lower extremity edema. He has undergone laser ablation to the left leg in January with a 2nd procedure to the right leg earlier this month. He was recently evaluated by Dr. [name] on 3/20 and was not experiencing any problems with either leg at that time. Patient reports that about one week ago he began noticing redness and swelling of the left leg in the past 24 hours developed a spontaneous opening that has been draining a combination of purulent bloody and serous fluids. The area is quite painful. He has been taking NSAIDs without relief. Patient has not been wearing any boots or other shoes that may irritate this area of his leg. He has no known trauma to the leg. He has not recently been on antibiotics.  ED Course:  Vital Signs: BP (!) 150/76 (BP Location: Right Arm)   Pulse 79   Temp 98.2 F (36.8 C) (Oral)   Resp 18   SpO2 93%  DG right ankle: Soft tissue swelling consistent with cellulitis without any emphysema or acute osseous abnormality Lab data: Sodium 137, potassium 4.2, chloride 99, CO2 32, glucose 104, BUN 13, creatinine 0.71, anion gap 6, AST 57, ALT 113, total protein 7.3, total bilirubin 0.9, white count 9900 with normal differential, hemoglobin 12.2,  platelets 241,000, blood cultures obtained in the ER. Medications and treatments: Vancomycin 1500 mg IV 1, Rocephin 1 g IV 1  Review of Systems:  In addition to the HPI above,  No Fever-chills, myalgias or other constitutional symptoms No Headache, changes with Vision or hearing, new weakness, tingling, numbness in any extremity, dizziness, dysarthria or word finding difficulty, gait disturbance or imbalance, tremors or seizure activity No problems swallowing food or Liquids, indigestion/reflux, choking or coughing while eating, abdominal pain with or after eating No Chest pain, Cough or Shortness of Breath, palpitations, orthopnea or DOE No Abdominal pain, N/V, melena,hematochezia, dark tarry stools, constipation No dysuria, malodorous urine, hematuria or flank pain No new skin masses or bruises, No new joint pains, aches, swelling or redness No recent unintentional weight gain or loss No polyuria, polydypsia or polyphagia   Past Medical History:  Diagnosis Date  . HTN (hypertension)   . Obesity   . Sleep apnea     Past Surgical History:  Procedure Laterality Date  . ENDOVENOUS ABLATION SAPHENOUS VEIN W/ LASER Right 04/06/2016   endovenous laser ablation right greater saphenous vein by Tinnie Gens MD   . NO PAST SURGERIES      Social History   Social History  . Marital status: Married    Spouse name: N/A  . Number of children: N/A  . Years of education: N/A   Occupational History  . transport driver    Social History Main Topics  . Smoking status: Never Smoker  . Smokeless tobacco: Never Used  Comment: cigars occ  . Alcohol use Yes     Comment: 2-3 beers monthly  . Drug use: No  . Sexual activity: Not on file   Other Topics Concern  . Not on file   Social History Narrative  . 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   No Known Allergies  Family History  Problem Relation Age of Onset  . Hypertension Mother   . Hypertension Father     Prior to Admission medications   Medication Sig Start Date End Date Taking? Authorizing Provider  diclofenac (VOLTAREN) 75 MG EC tablet Take 150 mg by mouth daily.    Yes Historical Provider, MD  fish oil-omega-3 fatty acids 1000 MG capsule Take 1 g by mouth daily.   Yes Historical Provider, MD  GARLIC PO Take 1 tablet by mouth daily.   Yes Historical Provider, MD  hydrochlorothiazide (HYDRODIURIL) 25 MG tablet Take 25 mg by mouth daily.   Yes Historical Provider, MD  ibuprofen (ADVIL,MOTRIN) 200 MG tablet Take 400 mg by mouth every 6 (six) hours as needed for headache or moderate pain.   Yes Historical Provider, MD  MULTIPLE VITAMIN PO Take 1 tablet by mouth daily.   Yes Historical Provider, MD    Physical Exam: Vitals:   04/24/16 1242 04/24/16 1430 04/24/16 1500 04/24/16 1536  BP: 139/83 (!) 165/76 (!) 176/78 (!) 150/76  Pulse: 89  75 79  Resp: 18   18  Temp: 98.2 F (36.8 C)     TempSrc: Oral     SpO2: 94%  94% 93%      Constitutional: NAD, calm, comfortable-Morbid obesity Eyes: PERRL, lids and conjunctivae normal ENMT: Mucous membranes are moist. Posterior pharynx clear of any exudate or lesions.Normal dentition.  Neck: normal, supple, no masses, no thyromegaly Respiratory: clear to auscultation bilaterally, no wheezing, no crackles. Normal respiratory effort. No accessory muscle use.  Cardiovascular: Regular rate and rhythm, no murmurs / rubs / gallops. Bilateral lower extremity edema left much greater than right at 3+. 2+ pedal pulses. No carotid bruits.  Abdomen: no tenderness, no masses palpated. No hepatosplenomegaly. Bowel sounds positive.  Musculoskeletal: no clubbing / cyanosis. No joint deformity upper and lower extremities. Good ROM, no contractures. Normal  muscle tone.  Skin: no rashes, lesions, ulcers or induration except for left lower extremity as follows: Area of redness erythema and induration involving the entire left foot extending halfway up the calf and a circumferential pattern that and irregularly-this area has been demarcated with a pen to follow progression-there is an open area over the medial malleolus with clear blood-tinged fluid expressed out/significant pain reproduced with attempts to minimally express any further drainage-pulses intact Neurologic: CN 2-12 grossly intact. Sensation intact, DTR normal. Strength 5/5 x all 4 extremities.  Psychiatric: Normal judgment and insight. Alert and oriented x 3. Normal mood.    Labs on Admission: I have personally reviewed following labs and imaging studies  CBC:  Recent Labs Lab 04/24/16 1313  WBC 9.9  NEUTROABS 6.9  HGB 12.2*  HCT 37.2*  MCV 94.2  PLT 500   Basic Metabolic Panel:  Recent Labs Lab 04/24/16 1313  NA 137  K 4.2  CL 99*  CO2 32  GLUCOSE 104*  BUN 13  CREATININE 0.71  CALCIUM  9.2   GFR: Estimated Creatinine Clearance: 182.8 mL/min (by C-G formula based on SCr of 0.71 mg/dL). Liver Function Tests:  Recent Labs Lab 04/24/16 1313  AST 57*  ALT 113*  ALKPHOS 106  BILITOT 0.9  PROT 7.3  ALBUMIN 3.5   No results for input(s): LIPASE, AMYLASE in the last 168 hours. No results for input(s): AMMONIA in the last 168 hours. Coagulation Profile: No results for input(s): INR, PROTIME in the last 168 hours. Cardiac Enzymes: No results for input(s): CKTOTAL, CKMB, CKMBINDEX, TROPONINI in the last 168 hours. BNP (last 3 results) No results for input(s): PROBNP in the last 8760 hours. HbA1C: No results for input(s): HGBA1C in the last 72 hours. CBG: No results for input(s): GLUCAP in the last 168 hours. Lipid Profile: No results for input(s): CHOL, HDL, LDLCALC, TRIG, CHOLHDL, LDLDIRECT in the last 72 hours. Thyroid Function Tests: No results for  input(s): TSH, T4TOTAL, FREET4, T3FREE, THYROIDAB in the last 72 hours. Anemia Panel: No results for input(s): VITAMINB12, FOLATE, FERRITIN, TIBC, IRON, RETICCTPCT in the last 72 hours. Urine analysis: No results found for: COLORURINE, APPEARANCEUR, LABSPEC, PHURINE, GLUCOSEU, HGBUR, BILIRUBINUR, KETONESUR, PROTEINUR, UROBILINOGEN, NITRITE, LEUKOCYTESUR Sepsis Labs: @LABRCNTIP (procalcitonin:4,lacticidven:4) )No results found for this or any previous visit (from the past 240 hour(s)).   Radiological Exams on Admission: Dg Ankle Complete Left  Result Date: 04/24/2016 CLINICAL DATA:  Left lower leg swelling, redness, and drainage. Cellulitis. Vein surgery 2 months ago. EXAM: LEFT ANKLE COMPLETE - 3+ VIEW COMPARISON:  None. FINDINGS: The soft tissue swelling and subcutaneous fat reticulation are present throughout the visualized lower leg, ankle, and dorsum of the foot. No soft tissue emphysema is identified. No acute fracture or dislocation is seen. Mild degenerative spurring is noted in the midfoot, and there are small posterior and moderate plantar calcaneal enthesophytes. No radiopaque foreign body. IMPRESSION: Diffuse cellulitic type changes without evidence of soft tissue emphysema or acute osseous abnormality. Electronically Signed   By: Logan Bores M.D.   On: 04/24/2016 13:58     Assessment/Plan Principal Problem:   Left leg cellulitis/Varicose veins of bilateral lower extremities with other complications -Patient with known lower extremity swelling secondary to varicosities and has undergone recent bilateral laser ablation procedures without any immediate postprocedure complications who now presents with worsened left lower extremity swelling, redness, pain and purulent drainage concerning for cellulitis -Plain x-ray ankle unremarkable but for completeness of exam will obtain MR left ankle -Cellulitis order set initiated-utilize vancomycin for purulent cellulitis -NSAIDs for pain control  as prior to admission; due to patient's job he would like to avoid utilization of narcotics for pain management -Likely will need to follow-up with Dr. [name] after discharge -Follow up on blood cultures  Active Problems:   OSA (obstructive sleep apnea) -Continue nocturnal CPAP -He has never undergone echocardiogram and given recurrent edema despite laser vein ablation patient may have undetected RHF/right systolic dysfunction -See below regarding thiazide diuretic -Pending echocardiogram results may benefit from the addition of afterload reduction agent and or nitrates    HTN (hypertension) -Continue thiazide diuretic -Current blood pressure not adequately controlled at times but this may be related to pain and anxiety    Morbid obesity with BMI of 50.0-59.9, adult  -Patient needs to make significant lifestyle changes regarding activity and diet; weight likely contributing to persistence of edema and sleep apnea    Transaminitis -Suspect related to undetected fatty liver disease -Consider outpatient abdominal ultrasound      DVT prophylaxis: Lovenox Code Status: Full  Family Communication: Wife Disposition Plan: Home Consults called: None    Hazelle Woollard L. ANP-BC [facility] Pager [phone]   If 7PM-7AM, please contact night-coverage [email or web address] Password [facility]  04/24/2016, 4:31 PM

## 2016-04-24 NOTE — ED Triage Notes (Signed)
Pt reports having vein surgery to left lower leg in January, began having swelling to left lower leg again this week. Redness noted and family reports pus discharge from wound. Denies fever.

## 2016-04-24 NOTE — Consult Note (Signed)
Reason for Consult:infected left ankle Referring Physician: Hobbs  Jeffery Simon is an 51 y.o. male.  HPI: 51 yo male who presents with a two week history of erythema and warmth to the left ankle.  He has been treated recently at the vein clinic.  The patient noted drainage which brought him to the hospital today and he was admitted to the medical team.  Due to concern for deep abscess I have been consulted for surgical management.  Past Medical History:  Diagnosis Date  . HTN (hypertension)   . Obesity   . Sleep apnea     Past Surgical History:  Procedure Laterality Date  . ENDOVENOUS ABLATION SAPHENOUS VEIN W/ LASER Right 04/06/2016   endovenous laser ablation right greater saphenous vein by Jeffery Lawson MD   . NO PAST SURGERIES      Family History  Problem Relation Age of Onset  . Hypertension Mother   . Hypertension Father     Social History:  reports that he has never smoked. He has never used smokeless tobacco. He reports that he drinks alcohol. He reports that he does not use drugs.  Allergies: No Known Allergies  Medications: I have reviewed the patient's current medications.  Results for orders placed or performed during the hospital encounter of 04/24/16 (from the past 48 hour(s))  Comprehensive metabolic panel     Status: Abnormal   Collection Time: 04/24/16  1:13 PM  Result Value Ref Range   Sodium 137 135 - 145 mmol/L   Potassium 4.2 3.5 - 5.1 mmol/L   Chloride 99 (L) 101 - 111 mmol/L   CO2 32 22 - 32 mmol/L   Glucose, Bld 104 (H) 65 - 99 mg/dL   BUN 13 6 - 20 mg/dL   Creatinine, Ser 0.71 0.61 - 1.24 mg/dL   Calcium 9.2 8.9 - 10.3 mg/dL   Total Protein 7.3 6.5 - 8.1 g/dL   Albumin 3.5 3.5 - 5.0 g/dL   AST 57 (H) 15 - 41 U/L   ALT 113 (H) 17 - 63 U/L   Alkaline Phosphatase 106 38 - 126 U/L   Total Bilirubin 0.9 0.3 - 1.2 mg/dL   GFR calc non Af Amer >60 >60 mL/min   GFR calc Af Amer >60 >60 mL/min    Comment: (NOTE) The eGFR has been calculated  using the CKD EPI equation. This calculation has not been validated in all clinical situations. eGFR's persistently <60 mL/min signify possible Chronic Kidney Disease.    Anion gap 6 5 - 15  CBC with Differential     Status: Abnormal   Collection Time: 04/24/16  1:13 PM  Result Value Ref Range   WBC 9.9 4.0 - 10.5 K/uL   RBC 3.95 (L) 4.22 - 5.81 MIL/uL   Hemoglobin 12.2 (L) 13.0 - 17.0 g/dL   HCT 37.2 (L) 39.0 - 52.0 %   MCV 94.2 78.0 - 100.0 fL   MCH 30.9 26.0 - 34.0 pg   MCHC 32.8 30.0 - 36.0 g/dL   RDW 13.1 11.5 - 15.5 %   Platelets 241 150 - 400 K/uL   Neutrophils Relative % 70 %   Neutro Abs 6.9 1.7 - 7.7 K/uL   Lymphocytes Relative 17 %   Lymphs Abs 1.7 0.7 - 4.0 K/uL   Monocytes Relative 8 %   Monocytes Absolute 0.8 0.1 - 1.0 K/uL   Eosinophils Relative 5 %   Eosinophils Absolute 0.5 0.0 - 0.7 K/uL   Basophils Relative 0 %     Basophils Absolute 0.0 0.0 - 0.1 K/uL  Magnesium     Status: None   Collection Time: 04/24/16  7:03 PM  Result Value Ref Range   Magnesium 2.2 1.7 - 2.4 mg/dL    Dg Ankle Complete Left  Result Date: 04/24/2016 CLINICAL DATA:  Left lower leg swelling, redness, and drainage. Cellulitis. Vein surgery 2 months ago. EXAM: LEFT ANKLE COMPLETE - 3+ VIEW COMPARISON:  None. FINDINGS: The soft tissue swelling and subcutaneous fat reticulation are present throughout the visualized lower leg, ankle, and dorsum of the foot. No soft tissue emphysema is identified. No acute fracture or dislocation is seen. Mild degenerative spurring is noted in the midfoot, and there are small posterior and moderate plantar calcaneal enthesophytes. No radiopaque foreign body. IMPRESSION: Diffuse cellulitic type changes without evidence of soft tissue emphysema or acute osseous abnormality. Electronically Signed   By: Jeffery Simon M.D.   On: 04/24/2016 13:58    ROS Blood pressure (!) 145/72, pulse 79, temperature 98.5 F (36.9 C), resp. rate 18, SpO2 95 %. Physical Exam patient  laying in hospital bed in no apparent distress with left foot elevated. 1 cm draining sinus over the medial ankle area.  Erythema all around the medial ankle and distal leg but below the knee.  Sensation intact., wiggles toes  Assessment/Plan: Left ankle abscess. Plan I+D in AM due to patient eating a full dinner.  Discussed with the primary team (DrHobbs and Night coverage) the outstanding ECHO order.  Both assured me that the patient is cleared for surgery. Discussed with Jeffery Simon who will perform the surgery in AM.  PLan I+D and deep cultures as well as aspiration of ankle joint. Will order CT for tonight to eval abscess location.  Jeffery Simon,Jeffery Simon 04/24/2016, 8:49 PM

## 2016-04-24 NOTE — Progress Notes (Signed)
Pharmacy Antibiotic Note Jeffery Simon is a 51 y.o. male admitted on 04/24/2016 with left medial malleolus cellulitis with draining abscess. Pharmacy has been consulted for vancomycin dosing. Received 1500 mg IV vancomycin x 1 in ED earlier today.   Plan: Vancomycin 1500 IV every 12 hours.  Goal trough 15-20 mcg/mL.   Temp (24hrs), Avg:98.2 F (36.8 C), Min:98.2 F (36.8 C), Max:98.2 F (36.8 C)   Recent Labs Lab 04/24/16 1313  WBC 9.9  CREATININE 0.71    Estimated Creatinine Clearance: 182.8 mL/min (by C-G formula based on SCr of 0.71 mg/dL).    No Known Allergies  Antimicrobials this admission:  3/31 vancomycin  >>  3/31 Ceftriaxone x 1 dose   Thank you for allowing pharmacy to be a part of this patient's care.  Vincenza Hews, PharmD, BCPS 04/24/2016, 4:35 PM

## 2016-04-25 ENCOUNTER — Observation Stay (HOSPITAL_COMMUNITY): Payer: BLUE CROSS/BLUE SHIELD | Admitting: Anesthesiology

## 2016-04-25 ENCOUNTER — Encounter (HOSPITAL_COMMUNITY): Payer: Self-pay | Admitting: Anesthesiology

## 2016-04-25 ENCOUNTER — Encounter (HOSPITAL_COMMUNITY)
Admission: EM | Disposition: A | Discharge status: Home Health | Payer: Self-pay | Source: Home / Self Care | Attending: Family Medicine

## 2016-04-25 DIAGNOSIS — K760 Fatty (change of) liver, not elsewhere classified: Secondary | ICD-10-CM | POA: Diagnosis present

## 2016-04-25 DIAGNOSIS — M7989 Other specified soft tissue disorders: Secondary | ICD-10-CM | POA: Diagnosis not present

## 2016-04-25 DIAGNOSIS — I10 Essential (primary) hypertension: Secondary | ICD-10-CM | POA: Diagnosis not present

## 2016-04-25 DIAGNOSIS — L03116 Cellulitis of left lower limb: Secondary | ICD-10-CM

## 2016-04-25 DIAGNOSIS — Z8249 Family history of ischemic heart disease and other diseases of the circulatory system: Secondary | ICD-10-CM | POA: Diagnosis not present

## 2016-04-25 DIAGNOSIS — R740 Nonspecific elevation of levels of transaminase and lactic acid dehydrogenase [LDH]: Secondary | ICD-10-CM | POA: Diagnosis not present

## 2016-04-25 DIAGNOSIS — I83893 Varicose veins of bilateral lower extremities with other complications: Secondary | ICD-10-CM

## 2016-04-25 DIAGNOSIS — M25572 Pain in left ankle and joints of left foot: Secondary | ICD-10-CM | POA: Diagnosis not present

## 2016-04-25 DIAGNOSIS — B9562 Methicillin resistant Staphylococcus aureus infection as the cause of diseases classified elsewhere: Secondary | ICD-10-CM | POA: Diagnosis present

## 2016-04-25 DIAGNOSIS — Z6841 Body Mass Index (BMI) 40.0 and over, adult: Secondary | ICD-10-CM | POA: Diagnosis not present

## 2016-04-25 DIAGNOSIS — I270 Primary pulmonary hypertension: Secondary | ICD-10-CM | POA: Diagnosis not present

## 2016-04-25 DIAGNOSIS — M79609 Pain in unspecified limb: Secondary | ICD-10-CM | POA: Diagnosis not present

## 2016-04-25 DIAGNOSIS — G4733 Obstructive sleep apnea (adult) (pediatric): Secondary | ICD-10-CM

## 2016-04-25 DIAGNOSIS — L02416 Cutaneous abscess of left lower limb: Secondary | ICD-10-CM | POA: Diagnosis not present

## 2016-04-25 HISTORY — DX: Cutaneous abscess of left lower limb: L02.416

## 2016-04-25 HISTORY — PX: I & D EXTREMITY: SHX5045

## 2016-04-25 HISTORY — DX: Cellulitis of left lower limb: L03.116

## 2016-04-25 LAB — CBC
HCT: 34.5 % — ABNORMAL LOW (ref 39.0–52.0)
HEMOGLOBIN: 11.2 g/dL — AB (ref 13.0–17.0)
MCH: 30.5 pg (ref 26.0–34.0)
MCHC: 32.5 g/dL (ref 30.0–36.0)
MCV: 94 fL (ref 78.0–100.0)
Platelets: 221 10*3/uL (ref 150–400)
RBC: 3.67 MIL/uL — AB (ref 4.22–5.81)
RDW: 13.5 % (ref 11.5–15.5)
WBC: 7.8 10*3/uL (ref 4.0–10.5)

## 2016-04-25 LAB — BASIC METABOLIC PANEL
ANION GAP: 9 (ref 5–15)
BUN: 12 mg/dL (ref 6–20)
CHLORIDE: 99 mmol/L — AB (ref 101–111)
CO2: 31 mmol/L (ref 22–32)
Calcium: 8.6 mg/dL — ABNORMAL LOW (ref 8.9–10.3)
Creatinine, Ser: 0.73 mg/dL (ref 0.61–1.24)
GFR calc non Af Amer: >60 mL/min (ref >60)
Glucose, Bld: 108 mg/dL — ABNORMAL HIGH (ref 65–99)
Potassium: 3.5 mmol/L (ref 3.5–5.1)
Sodium: 139 mmol/L (ref 135–145)

## 2016-04-25 LAB — HIV ANTIBODY (ROUTINE TESTING W REFLEX): HIV Screen 4th Generation wRfx: NONREACTIVE

## 2016-04-25 LAB — SURGICAL PCR SCREEN
MRSA, PCR: POSITIVE — AB
Staphylococcus aureus: POSITIVE — AB

## 2016-04-25 SURGERY — IRRIGATION AND DEBRIDEMENT EXTREMITY
Anesthesia: General | Site: Ankle | Laterality: Left

## 2016-04-25 MED ORDER — FENTANYL CITRATE (PF) 100 MCG/2ML IJ SOLN
INTRAMUSCULAR | Status: DC | PRN
  Administered 2016-04-25 (×2): 50 ug via INTRAVENOUS
  Administered 2016-04-25: 100 ug via INTRAVENOUS

## 2016-04-25 MED ORDER — PROMETHAZINE HCL 25 MG/ML IJ SOLN: 6.2500 mg | INTRAMUSCULAR | Status: DC | PRN

## 2016-04-25 MED ORDER — LACTATED RINGERS IV SOLN
INTRAVENOUS | Status: DC | PRN
  Administered 2016-04-25: 07:00:00 via INTRAVENOUS

## 2016-04-25 MED ORDER — LIDOCAINE 2% (20 MG/ML) 5 ML SYRINGE
INTRAMUSCULAR | Status: AC
  Filled 2016-04-25: qty 5

## 2016-04-25 MED ORDER — MIDAZOLAM HCL 5 MG/5ML IJ SOLN
INTRAMUSCULAR | Status: DC | PRN
  Administered 2016-04-25 (×2): 1 mg via INTRAVENOUS

## 2016-04-25 MED ORDER — ONDANSETRON HCL 4 MG/2ML IJ SOLN
INTRAMUSCULAR | Status: AC
  Filled 2016-04-25: qty 2

## 2016-04-25 MED ORDER — ONDANSETRON HCL 4 MG/2ML IJ SOLN
INTRAMUSCULAR | Status: DC | PRN
  Administered 2016-04-25: 4 mg via INTRAVENOUS

## 2016-04-25 MED ORDER — CEFAZOLIN SODIUM-DEXTROSE 2-4 GM/100ML-% IV SOLN
INTRAVENOUS | Status: AC
  Filled 2016-04-25: qty 100

## 2016-04-25 MED ORDER — HYDROMORPHONE HCL 1 MG/ML IJ SOLN: 0.2500 mg | INTRAMUSCULAR | Status: DC | PRN

## 2016-04-25 MED ORDER — DEXTROSE 5 % IV SOLN: 3.0000 g | INTRAVENOUS | Status: DC

## 2016-04-25 MED ORDER — LIDOCAINE HCL (CARDIAC) 20 MG/ML IV SOLN
INTRAVENOUS | Status: DC | PRN
Start: 2016-04-25 — End: 2016-04-25
  Administered 2016-04-25: 100 mg via INTRAVENOUS

## 2016-04-25 MED ORDER — METOCLOPRAMIDE HCL 5 MG/ML IJ SOLN: 5.0000 mg | Freq: Three times a day (TID) | INTRAMUSCULAR | Status: DC | PRN

## 2016-04-25 MED ORDER — PROPOFOL 10 MG/ML IV BOLUS
INTRAVENOUS | Status: AC
  Filled 2016-04-25: qty 20

## 2016-04-25 MED ORDER — PROPOFOL 10 MG/ML IV BOLUS
INTRAVENOUS | Status: DC | PRN
  Administered 2016-04-25: 200 mg via INTRAVENOUS

## 2016-04-25 MED ORDER — CEFAZOLIN SODIUM-DEXTROSE 2-3 GM-% IV SOLR
INTRAVENOUS | Status: DC | PRN
  Administered 2016-04-25: 3 g via INTRAVENOUS

## 2016-04-25 MED ORDER — POVIDONE-IODINE 10 % EX SWAB: 2.0000 "application " | Freq: Once | CUTANEOUS | Status: DC

## 2016-04-25 MED ORDER — MIDAZOLAM HCL 2 MG/2ML IJ SOLN
INTRAMUSCULAR | Status: AC
  Filled 2016-04-25: qty 2

## 2016-04-25 MED ORDER — METOCLOPRAMIDE HCL 5 MG PO TABS: 5.0000 mg | ORAL_TABLET | Freq: Three times a day (TID) | ORAL | Status: DC | PRN

## 2016-04-25 MED ORDER — FENTANYL CITRATE (PF) 250 MCG/5ML IJ SOLN
INTRAMUSCULAR | Status: AC
  Filled 2016-04-25: qty 5

## 2016-04-25 MED ORDER — HYDROCODONE-ACETAMINOPHEN 5-325 MG PO TABS
1.0000 | ORAL_TABLET | Freq: Four times a day (QID) | ORAL | Status: DC | PRN
  Administered 2016-04-25 (×2): 2 via ORAL
  Filled 2016-04-25 (×2): qty 2

## 2016-04-25 MED ORDER — 0.9 % SODIUM CHLORIDE (POUR BTL) OPTIME
TOPICAL | Status: DC | PRN
  Administered 2016-04-25: 1000 mL

## 2016-04-25 MED ORDER — EPHEDRINE SULFATE 50 MG/ML IJ SOLN
INTRAMUSCULAR | Status: DC | PRN
  Administered 2016-04-25: 10 mg via INTRAVENOUS

## 2016-04-25 MED ORDER — PHENYLEPHRINE 40 MCG/ML (10ML) SYRINGE FOR IV PUSH (FOR BLOOD PRESSURE SUPPORT)
PREFILLED_SYRINGE | INTRAVENOUS | Status: AC
  Filled 2016-04-25: qty 10

## 2016-04-25 MED ORDER — CHLORHEXIDINE GLUCONATE 4 % EX LIQD: 60.0000 mL | Freq: Once | CUTANEOUS | Status: DC

## 2016-04-25 SURGICAL SUPPLY — 45 items
BANDAGE ELASTIC 4 VELCRO ST LF (GAUZE/BANDAGES/DRESSINGS) ×1 IMPLANT
BANDAGE ELASTIC 6 VELCRO ST LF (GAUZE/BANDAGES/DRESSINGS) ×1 IMPLANT
BLADE SURG 10 STRL SS (BLADE) ×2 IMPLANT
BNDG COHESIVE 4X5 TAN STRL (GAUZE/BANDAGES/DRESSINGS) IMPLANT
BNDG COHESIVE 6X5 TAN STRL LF (GAUZE/BANDAGES/DRESSINGS) IMPLANT
BNDG GAUZE ELAST 4 BULKY (GAUZE/BANDAGES/DRESSINGS) IMPLANT
COVER SURGICAL LIGHT HANDLE (MISCELLANEOUS) ×2 IMPLANT
DRAPE HIP W/POCKET STRL (DRAPE) ×2 IMPLANT
DRAPE U-SHAPE 47X51 STRL (DRAPES) ×2 IMPLANT
DRSG ADAPTIC 3X8 NADH LF (GAUZE/BANDAGES/DRESSINGS) ×2 IMPLANT
DRSG PAD ABDOMINAL 8X10 ST (GAUZE/BANDAGES/DRESSINGS) ×2 IMPLANT
DURAPREP 26ML APPLICATOR (WOUND CARE) ×2 IMPLANT
ELECT CAUTERY BLADE 6.4 (BLADE) ×2 IMPLANT
ELECT REM PT RETURN 9FT ADLT (ELECTROSURGICAL) ×2
ELECTRODE REM PT RTRN 9FT ADLT (ELECTROSURGICAL) ×1 IMPLANT
GAUZE SPONGE 4X4 12PLY STRL (GAUZE/BANDAGES/DRESSINGS) ×2 IMPLANT
GAUZE SPONGE 4X4 12PLY STRL LF (GAUZE/BANDAGES/DRESSINGS) ×1 IMPLANT
GLOVE BIO SURGEON STRL SZ8.5 (GLOVE) ×4 IMPLANT
GLOVE BIOGEL PI IND STRL 8.5 (GLOVE) ×1 IMPLANT
GLOVE BIOGEL PI INDICATOR 8.5 (GLOVE) ×1
GOWN STRL REUS W/ TWL XL LVL3 (GOWN DISPOSABLE) IMPLANT
GOWN STRL REUS W/TWL 2XL LVL3 (GOWN DISPOSABLE) ×2 IMPLANT
GOWN STRL REUS W/TWL XL LVL3 (GOWN DISPOSABLE)
HANDPIECE INTERPULSE COAX TIP (DISPOSABLE) ×2
KIT BASIN OR (CUSTOM PROCEDURE TRAY) ×2 IMPLANT
KIT ROOM TURNOVER OR (KITS) ×2 IMPLANT
MANIFOLD NEPTUNE II (INSTRUMENTS) ×2 IMPLANT
NS IRRIG 1000ML POUR BTL (IV SOLUTION) ×2 IMPLANT
PACK GENERAL/GYN (CUSTOM PROCEDURE TRAY) ×2 IMPLANT
PACK UNIVERSAL I (CUSTOM PROCEDURE TRAY) ×2 IMPLANT
PAD ARMBOARD 7.5X6 YLW CONV (MISCELLANEOUS) ×4 IMPLANT
PAD CAST 4YDX4 CTTN HI CHSV (CAST SUPPLIES) IMPLANT
PADDING CAST COTTON 4X4 STRL (CAST SUPPLIES) ×2
PADDING CAST COTTON 6X4 STRL (CAST SUPPLIES) ×1 IMPLANT
SET HNDPC FAN SPRY TIP SCT (DISPOSABLE) ×1 IMPLANT
STOCKINETTE IMPERVIOUS 9X36 MD (GAUZE/BANDAGES/DRESSINGS) ×2 IMPLANT
SWAB COLLECTION DEVICE MRSA (MISCELLANEOUS) IMPLANT
SWAB CULTURE ESWAB REG 1ML (MISCELLANEOUS) ×1 IMPLANT
TOWEL OR 17X24 6PK STRL BLUE (TOWEL DISPOSABLE) ×2 IMPLANT
TOWEL OR 17X26 10 PK STRL BLUE (TOWEL DISPOSABLE) ×2 IMPLANT
TUBE ANAEROBIC SPECIMEN COL (MISCELLANEOUS) IMPLANT
TUBE CONNECTING 12X1/4 (SUCTIONS) ×2 IMPLANT
UNDERPAD 30X30 (UNDERPADS AND DIAPERS) IMPLANT
WATER STERILE IRR 1000ML POUR (IV SOLUTION) ×2 IMPLANT
YANKAUER SUCT BULB TIP NO VENT (SUCTIONS) ×2 IMPLANT

## 2016-04-25 NOTE — Anesthesia Preprocedure Evaluation (Signed)
Anesthesia Evaluation  Patient identified by MRN, date of birth, ID band Patient awake    Reviewed: Allergy & Precautions, NPO status , Patient's Chart, lab work & pertinent test results  Airway Mallampati: III  TM Distance: >3 FB Neck ROM: Full    Dental no notable dental hx. (+) Dental Advisory Given   Pulmonary sleep apnea and Continuous Positive Airway Pressure Ventilation ,    Pulmonary exam normal        Cardiovascular hypertension, Pt. on medications negative cardio ROS Normal cardiovascular exam     Neuro/Psych negative neurological ROS  negative psych ROS   GI/Hepatic negative GI ROS, Neg liver ROS,   Endo/Other  Morbid obesity  Renal/GU negative Renal ROS  negative genitourinary   Musculoskeletal negative musculoskeletal ROS (+)   Abdominal   Peds negative pediatric ROS (+)  Hematology negative hematology ROS (+)   Anesthesia Other Findings   Reproductive/Obstetrics negative OB ROS                             Anesthesia Physical Anesthesia Plan  ASA: III  Anesthesia Plan: General   Post-op Pain Management:    Induction: Intravenous  Airway Management Planned: LMA and Oral ETT  Additional Equipment:   Intra-op Plan:   Post-operative Plan: Extubation in OR  Informed Consent: I have reviewed the patients History and Physical, chart, labs and discussed the procedure including the risks, benefits and alternatives for the proposed anesthesia with the patient or authorized representative who has indicated his/her understanding and acceptance.   Dental advisory given  Plan Discussed with: CRNA, Anesthesiologist and Surgeon  Anesthesia Plan Comments:         Anesthesia Quick Evaluation

## 2016-04-25 NOTE — Op Note (Signed)
Jeffery Simon, GERSTNER NO.:  [id number]  MEDICAL RECORD NO.:  [id number]  LOCATION:  5N28C                        FACILITY:  [facility]  PHYSICIAN:  Rod Can, MD     DATE OF BIRTH:  [birth year]  DATE OF PROCEDURE:  04/25/2016 DATE OF DISCHARGE:                              OPERATIVE REPORT   PREOPERATIVE DIAGNOSIS:  Left lower extremity abscess.  POSTOPERATIVE DIAGNOSIS:  Left lower extremity abscess. No evidence of septic ankle arthritis.  PROCEDURE PERFORMED: 1. Aspiration of left ankle joint. 2. Debridement of skin and subcutaneous tissue, left ankle with     drainage of abscess.  ANESTHESIA:  General.  EBL:  Minimal.  COMPLICATIONS:  None.  SPECIMENS:  Left lower extremity superficial abscess for aerobic and anaerobic cultures.  ANTIBIOTICS:  Ancef 3 g.  DISPOSITION:  Stable to PACU.  INDICATIONS:  The patient is a 51 year old male with a history of venous insufficiency and spontaneous bleeding wound to the medial aspect of the left ankle.  The patient states that back in January 2018, he had laser vein surgery for an area that was spontaneously bleeding on his left ankle.  He has had a chronic wound in this area.  About 2 weeks ago, he started developing erythema, increasing pain and drainage.  He came to the emergency department.  He was admitted by the hospitalist.  He was started on IV antibiotics.  Orthopedic consultation was placed.  I was asked to assume care by my partner, Dr. Veverly Fells.  We discussed the risks, benefits, alternatives to aspiration of the left ankle as well as irrigation and debridement of left lower extremity abscess.  The patient understood the risks, he wished to proceed.  DESCRIPTION OF PROCEDURE IN DETAIL:  I identified the patient in the holding area.  I marked the surgical site.  He was taken to the operating room.  General anesthesia was induced on his bed.  He was given IV antibiotics within 60 minutes of  beginning the procedure.  He was already receiving Rocephin and vancomycin on the floor as well. Time-out was called verifying site and side of surgery.  Left lower extremity was prepped and draped in the normal sterile surgical fashion. I began by examining his left lower extremity.  I used an 18-gauge needle to aspirate the ankle from the standard lateral arthroscopy portal.  I was only able to withdraw about 1 mL of clear straw-colored joint fluid, which I discarded; therefore the ankle joint itself was not infected.  I turned my attention to the medial aspect of the ankle over the medial malleolus itself.  He did have a 1 cm wound with a fluctuant abscess, he had surrounding erythema.  I used a #10 blade to extend the wound about 1 cm proximally and distally.  I encountered a small abscess.  Culture swabs were used to swab the abscess.  I used a #15 blade to excisionally debride some necrotic skin and subcutaneous fat. I also used a rongeur to excisionally debride some necrotic subcutaneous fat as well.  I then used a tonsil to ensure that there were no additional fluctuant pockets.  I irrigated 3 L of saline through the wound.  I packed the wound with quarter-inch Nu gauze.  Sterile dressing was applied.  The patient was then awakened, taken to the PACU in stable condition.  Sponge, needle, and instrument counts were correct at the case x2.  There were no known complications.  Postoperatively, we will readmit the patient to the hospitalist.  We will follow his intra op cultures.  Continue broad-spectrum IV antibiotics and narrow as indicated.  He is going to need daily wound care packing changes.  He is going to need to follow up at the [place] [facility].  He is going to need at least 2 weeks of antibiotics to treat his infection.          ______________________________ Rod Can, MD     BS/MEDQ  D:  04/25/2016  T:  04/25/2016  Job:  [id number]

## 2016-04-25 NOTE — Progress Notes (Signed)
PROGRESS NOTE  Jeffery Simon  FOY:[id number] DOB: [birth year] DOA: 04/24/2016 PCP: Greig Right, MD   Brief Narrative: Jeffery Simon is a 51 y.o. male with a medical history significant for HTN, severe OSA on CPAP, and morbid obesity who presented 3/31 with a painful wound on the left ankle worsening over 1 week that began draining pus for 24 hours. He has been followed by Dr. Lawson/[facility] for bilateral lower extremity edema, having undergone laser ablation to the left leg in January and to the right leg earlier this month. On arrival he was afebrile, HR 79, BP 150/76. Left ankle XR showed soft tissue swelling consistent with cellulitis without any emphysema or acute osseous abnormality. Subsequent CT showed circumferential severe soft tissue edema with posteromedial ankle fluid collection. Vancomycin and rocephin were given and orthopedics was consulted. He underwent I&D with joint aspirate on 4/1 by Dr. [name].    Assessment & Plan: Principal Problem:   Left leg cellulitis Active Problems:   OSA (obstructive sleep apnea)   Varicose veins of bilateral lower extremities with other complications   HTN (hypertension)   Morbid obesity with BMI of 50.0-59.9, adult (HCC)   Transaminitis   Cellulitis and abscess of left leg  Left ankle cellulitis and abscess: Seen on CT. No evidence of sepsis, afebrile, no leukocytosis. s/p I&D, joint aspirate 4/1 by Dr. [name]. - Treating empirically with CTX and coverage for MRSA with vancomycin  - Await results of intraoperative culture and blood cultures.  - Continue NSAIDs, tylenol prn pain control. 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   Chronic LE edema: Presumably due to venous insufficiency, worsened by morbid obesity.  - Echo to r/o RV failure - Albumin wnl, creatinine wnl. - Check TSH - Follow-up with Dr. [name] after discharge - Check doppler LLE to r/o DVT given significant tender  swelling extending past cellulitis.  OSA: Chronic, stable.  - Continue nocturnal CPAP - Echocardiogram: He has never undergone echocardiogram and given recurrent edema despite laser vein ablation patient may have undetected RHF/right systolic dysfunction  HTN: Control improving, elevated initially due to pain. - Continue thiazide diuretic  Morbid obesity: BMI reported >50. - Brief lifestyle change counseling provided.  - Significantly adversely affecting comorbidities.  - PT/OT as above.   Transaminitis: Mild. Suspect related to undiagnosed NAFLD. - Consider outpatient abdominal ultrasound  DVT prophylaxis: SCDs Code Status: Full Family Communication: Wife and mother at bedside Disposition Plan: Inpatient  Consultants:   Orthopedic surgery, Dr. [name]  Procedures:   Left ankle I&D, joint aspiration 4/1  Antimicrobials:  Vancomycin 3/31  Ceftriaxone 3/31   Subjective: Pt with controlled pain immediately post-op this AM. No chest pain, dyspnea, orthopnea, PND. Leg swelling near baseline on right, worse on left.   Objective: Vitals:   04/25/16 0648 04/25/16 0838 04/25/16 0845 04/25/16 0900  BP: (!) 169/98 (!) 145/84 (!) 142/80 (!) 144/79  Pulse: 86 85 78 76  Resp: 18 11 13 12   Temp: 98 F (36.7 C) 98.7 F (37.1 C)  97.7 F (36.5 C)  TempSrc: Oral     SpO2: 98% 96% 94% 98%    Intake/Output Summary (Last 24 hours) at 04/25/16 0957 Last data filed at 04/25/16 0830  Gross per 24 hour  Intake              400 ml  Output                5 ml  Net              395 ml   There were no vitals filed for this visit.  Examination: General exam: Morbidly obese 51yo male in no distress Respiratory system: Non-labored breathing room air. Clear to auscultation bilaterally, distant. Cardiovascular system: Regular rate and rhythm. No murmur, rub, or gallop. No JVD, though difficult to appreciate due to habitus Gastrointestinal system: Abdomen obese, soft, non-tender,  non-distended, with normoactive bowel sounds. Central nervous system: Alert and oriented. No focal neurological deficits. Extremities: Left ankle dressed post-op, not taken down. Erythema receding from irregular demarcation on calf. Significant edema compared to right.  Skin: Diffuse varicosities in bilateral LE's. Left ankle dressing c/d/i Psychiatry: Judgement and insight appear normal. Mood & affect appropriate.   Data Reviewed: I have personally reviewed following labs and imaging studies  CBC:  Recent Labs Lab 04/24/16 1313 04/25/16 0436  WBC 9.9 7.8  NEUTROABS 6.9  --   HGB 12.2* 11.2*  HCT 37.2* 34.5*  MCV 94.2 94.0  PLT 241 353   Basic Metabolic Panel:  Recent Labs Lab 04/24/16 1313 04/24/16 1903 04/25/16 0436  NA 137  --  139  K 4.2  --  3.5  CL 99*  --  99*  CO2 32  --  31  GLUCOSE 104*  --  108*  BUN 13  --  12  CREATININE 0.71  --  0.73  CALCIUM 9.2  --  8.6*  MG  --  2.2  --    GFR: Estimated Creatinine Clearance: 182.8 mL/min (by C-G formula based on SCr of 0.73 mg/dL). Liver Function Tests:  Recent Labs Lab 04/24/16 1313  AST 57*  ALT 113*  ALKPHOS 106  BILITOT 0.9  PROT 7.3  ALBUMIN 3.5   No results for input(s): LIPASE, AMYLASE in the last 168 hours. No results for input(s): AMMONIA in the last 168 hours. Coagulation Profile: No results for input(s): INR, PROTIME in the last 168 hours. Cardiac Enzymes: No results for input(s): CKTOTAL, CKMB, CKMBINDEX, TROPONINI in the last 168 hours. BNP (last 3 results) No results for input(s): PROBNP in the last 8760 hours. HbA1C: No results for input(s): HGBA1C in the last 72 hours. CBG: No results for input(s): GLUCAP in the last 168 hours. Lipid Profile: No results for input(s): CHOL, HDL, LDLCALC, TRIG, CHOLHDL, LDLDIRECT in the last 72 hours. Thyroid Function Tests: No results for input(s): TSH, T4TOTAL, FREET4, T3FREE, THYROIDAB in the last 72 hours. Anemia Panel: No results for input(s):  VITAMINB12, FOLATE, FERRITIN, TIBC, IRON, RETICCTPCT in the last 72 hours. Urine analysis: No results found for: COLORURINE, APPEARANCEUR, LABSPEC, PHURINE, GLUCOSEU, HGBUR, BILIRUBINUR, KETONESUR, PROTEINUR, UROBILINOGEN, NITRITE, LEUKOCYTESUR Recent Results (from the past 240 hour(s))  Aerobic Culture (superficial specimen)     Status: None (Preliminary result)   Collection Time: 04/24/16  6:31 PM  Result Value Ref Range Status   Specimen Description LEG LEFT  Final   Special Requests Normal  Final   Gram Stain   Final    RARE WBC PRESENT, PREDOMINANTLY PMN RARE GRAM POSITIVE COCCI IN PAIRS    Culture PENDING  Incomplete   Report Status PENDING  Incomplete  Surgical pcr screen     Status: Abnormal   Collection Time: 04/24/16  9:05 PM  Result Value Ref Range Status   MRSA, PCR POSITIVE (A) NEGATIVE Final   Staphylococcus aureus POSITIVE (A) NEGATIVE Final    Comment:        The Xpert SA Assay (  FDA approved for NASAL specimens in patients over 67 years of age), is one component of a comprehensive surveillance program.  Test performance has been validated by [facility] for patients greater than or equal to 78 year old. It is not intended to diagnose infection nor to guide or monitor treatment. RESULT CALLED TO, READ BACK BY AND VERIFIED WITH: R LEIGHT,RN AT 2322 04/24/16 BY J FUDESCO       Radiology Studies: Dg Ankle Complete Left  Result Date: 04/24/2016 CLINICAL DATA:  Left lower leg swelling, redness, and drainage. Cellulitis. Vein surgery 2 months ago. EXAM: LEFT ANKLE COMPLETE - 3+ VIEW COMPARISON:  None. FINDINGS: The soft tissue swelling and subcutaneous fat reticulation are present throughout the visualized lower leg, ankle, and dorsum of the foot. No soft tissue emphysema is identified. No acute fracture or dislocation is seen. Mild degenerative spurring is noted in the midfoot, and there are small posterior and moderate plantar calcaneal enthesophytes. No radiopaque  foreign body. IMPRESSION: Diffuse cellulitic type changes without evidence of soft tissue emphysema or acute osseous abnormality. Electronically Signed   By: Logan Bores M.D.   On: 04/24/2016 13:58   Ct Ankle Left Wo Contrast  Result Date: 04/25/2016 CLINICAL DATA:  Left ankle abscess. EXAM: CT OF THE LEFT ANKLE WITHOUT CONTRAST TECHNIQUE: Multidetector CT imaging of the left ankle was performed according to the standard protocol. Multiplanar CT image reconstructions were also generated. COMPARISON:  None. FINDINGS: Bones/Joint/Cartilage No fracture or dislocation. Normal alignment. No joint effusion. No periosteal reaction or bone destruction. No aggressive lytic or sclerotic osseous lesion. Ankle mortise is intact. Subtalar joints are normal. Old posttraumatic deformity of the anterior process of the calcaneus. Ligaments Ligaments are suboptimally evaluated by CT. Muscles and Tendons Muscles are normal. No significant muscle atrophy. Flexor, extensor, peroneal and Achilles tendons are grossly intact. Soft tissue No fluid collection or hematoma. No soft tissue mass. Severe soft tissue edema in the subcutaneous fat circumferentially around the ankle and extending into the foot. Focal area of fluid in the posteromedial ankle soft tissues measuring approximately 2 cm . IMPRESSION: 1. Severe soft tissue edema in the subcutaneous fat circumferentially around the ankle and extending into the foot most concerning for cellulitis. Focal area of fluid in the posteromedial ankle soft tissues measuring approximately 2 cm concerning for an small abscess. Electronically Signed   By: Kathreen Devoid   On: 04/25/2016 08:59    Scheduled Meds: . Chlorhexidine Gluconate Cloth  6 each Topical Q0600  . diclofenac  150 mg Oral Daily  . enoxaparin (LOVENOX) injection  40 mg Subcutaneous Q24H  . hydrochlorothiazide  25 mg Oral Daily  . mupirocin ointment  1 application Nasal BID  . omega-3 acid ethyl esters  1 g Oral Daily  .  vancomycin  1,500 mg Intravenous Q12H   Continuous Infusions:   LOS: 0 days   Time spent: 25 minutes.  Vance Gather, MD [facility] Pager [phone]  If 7PM-7AM, please contact night-coverage [email or web address] Password [email or web address] 04/25/2016, 9:57 AM

## 2016-04-25 NOTE — Brief Op Note (Signed)
04/25/2016  8:31 AM  PATIENT:  Jeffery Simon  51 y.o. male  PRE-OPERATIVE DIAGNOSIS:  Left Ankle Abscess  POST-OPERATIVE DIAGNOSIS:  Left Ankle Abscess  PROCEDURE:  Procedure(s): IRRIGATION AND DEBRIDEMENT EXTREMITY (Left)  SURGEON:  Surgeon(s) and Role:    * Rod Can, MD - Primary  PHYSICIAN ASSISTANT:   ASSISTANTS: none   ANESTHESIA:   general  EBL:  No intake/output data recorded.  BLOOD ADMINISTERED:none  DRAINS: none   LOCAL MEDICATIONS USED:  NONE  SPECIMEN:  Source of Specimen:  superficial left ankle abscess for culture  DISPOSITION OF SPECIMEN:  micro  COUNTS:  YES  TOURNIQUET:    DICTATION: .Other Dictation: Dictation Number [id number]  PLAN OF CARE: Admit to inpatient   PATIENT DISPOSITION:  PACU - hemodynamically stable.   Delay start of Pharmacological VTE agent (>24hrs) due to surgical blood loss or risk of bleeding: no

## 2016-04-25 NOTE — Anesthesia Postprocedure Evaluation (Addendum)
Anesthesia Post Note  Patient: Jeffery Simon  Procedure(s) Performed: Procedure(s) (LRB): IRRIGATION AND DEBRIDEMENT EXTREMITY (Left)  Patient location during evaluation: PACU Anesthesia Type: General Level of consciousness: sedated Pain management: pain level controlled Vital Signs Assessment: post-procedure vital signs reviewed and stable Respiratory status: spontaneous breathing and respiratory function stable Cardiovascular status: stable Anesthetic complications: no       Last Vitals:  Vitals:   04/25/16 0845 04/25/16 0900  BP: (!) 142/80 (!) 144/79  Pulse: 78 76  Resp: 13 12  Temp:  36.5 C    Last Pain:  Vitals:   04/25/16 0905  TempSrc:   PainSc: 0-No pain                 Shlomo Seres DANIEL

## 2016-04-25 NOTE — Discharge Instructions (Signed)
Daily wound care to left ankle: remove dressing and packing. Pack wound with 1/4 " nu-gauze and cover with ABDs & ace wrap. Complete antibiotics as prescribed.  Call the office at [phone] as soon as possible to schedule a follow up appointment with Dr. Veverly Fells during the week of 05/03/16.

## 2016-04-25 NOTE — Progress Notes (Signed)
Patient placed on CPAP for HS.  Autotitration (min 5, max 20) mode used as patient is unsure of home settings.  Room air.  Large FFM.  Patient is familiar with equipment and procedure.

## 2016-04-25 NOTE — H&P (View-Only) (Signed)
Reason for Consult:infected left ankle Referring Physician: Gideon Burstein is an 51 y.o. male.  HPI: 51 yo male who presents with a two week history of erythema and warmth to the left ankle.  He has been treated recently at the vein clinic.  The patient noted drainage which brought him to the hospital today and he was admitted to the medical team.  Due to concern for deep abscess I have been consulted for surgical management.  Past Medical History:  Diagnosis Date  . HTN (hypertension)   . Obesity   . Sleep apnea     Past Surgical History:  Procedure Laterality Date  . ENDOVENOUS ABLATION SAPHENOUS VEIN W/ LASER Right 04/06/2016   endovenous laser ablation right greater saphenous vein by Tinnie Gens MD   . NO PAST SURGERIES      Family History  Problem Relation Age of Onset  . Hypertension Mother   . Hypertension Father     Social History:  reports that he has never smoked. He has never used smokeless tobacco. He reports that he drinks alcohol. He reports that he does not use drugs.  Allergies: No Known Allergies  Medications: I have reviewed the patient's current medications.  Results for orders placed or performed during the hospital encounter of 04/24/16 (from the past 48 hour(s))  Comprehensive metabolic panel     Status: Abnormal   Collection Time: 04/24/16  1:13 PM  Result Value Ref Range   Sodium 137 135 - 145 mmol/L   Potassium 4.2 3.5 - 5.1 mmol/L   Chloride 99 (L) 101 - 111 mmol/L   CO2 32 22 - 32 mmol/L   Glucose, Bld 104 (H) 65 - 99 mg/dL   BUN 13 6 - 20 mg/dL   Creatinine, Ser 0.71 0.61 - 1.24 mg/dL   Calcium 9.2 8.9 - 10.3 mg/dL   Total Protein 7.3 6.5 - 8.1 g/dL   Albumin 3.5 3.5 - 5.0 g/dL   AST 57 (H) 15 - 41 U/L   ALT 113 (H) 17 - 63 U/L   Alkaline Phosphatase 106 38 - 126 U/L   Total Bilirubin 0.9 0.3 - 1.2 mg/dL   GFR calc non Af Amer >60 >60 mL/min   GFR calc Af Amer >60 >60 mL/min    Comment: (NOTE) The eGFR has been calculated  using the CKD EPI equation. This calculation has not been validated in all clinical situations. eGFR's persistently <60 mL/min signify possible Chronic Kidney Disease.    Anion gap 6 5 - 15  CBC with Differential     Status: Abnormal   Collection Time: 04/24/16  1:13 PM  Result Value Ref Range   WBC 9.9 4.0 - 10.5 K/uL   RBC 3.95 (L) 4.22 - 5.81 MIL/uL   Hemoglobin 12.2 (L) 13.0 - 17.0 g/dL   HCT 37.2 (L) 39.0 - 52.0 %   MCV 94.2 78.0 - 100.0 fL   MCH 30.9 26.0 - 34.0 pg   MCHC 32.8 30.0 - 36.0 g/dL   RDW 13.1 11.5 - 15.5 %   Platelets 241 150 - 400 K/uL   Neutrophils Relative % 70 %   Neutro Abs 6.9 1.7 - 7.7 K/uL   Lymphocytes Relative 17 %   Lymphs Abs 1.7 0.7 - 4.0 K/uL   Monocytes Relative 8 %   Monocytes Absolute 0.8 0.1 - 1.0 K/uL   Eosinophils Relative 5 %   Eosinophils Absolute 0.5 0.0 - 0.7 K/uL   Basophils Relative 0 %  Basophils Absolute 0.0 0.0 - 0.1 K/uL  Magnesium     Status: None   Collection Time: 04/24/16  7:03 PM  Result Value Ref Range   Magnesium 2.2 1.7 - 2.4 mg/dL    Dg Ankle Complete Left  Result Date: 04/24/2016 CLINICAL DATA:  Left lower leg swelling, redness, and drainage. Cellulitis. Vein surgery 2 months ago. EXAM: LEFT ANKLE COMPLETE - 3+ VIEW COMPARISON:  None. FINDINGS: The soft tissue swelling and subcutaneous fat reticulation are present throughout the visualized lower leg, ankle, and dorsum of the foot. No soft tissue emphysema is identified. No acute fracture or dislocation is seen. Mild degenerative spurring is noted in the midfoot, and there are small posterior and moderate plantar calcaneal enthesophytes. No radiopaque foreign body. IMPRESSION: Diffuse cellulitic type changes without evidence of soft tissue emphysema or acute osseous abnormality. Electronically Signed   By: Logan Bores M.D.   On: 04/24/2016 13:58    ROS Blood pressure (!) 145/72, pulse 79, temperature 98.5 F (36.9 C), resp. rate 18, SpO2 95 %. Physical Exam patient  laying in hospital bed in no apparent distress with left foot elevated. 1 cm draining sinus over the medial ankle area.  Erythema all around the medial ankle and distal leg but below the knee.  Sensation intact., wiggles toes  Assessment/Plan: Left ankle abscess. Plan I+D in AM due to patient eating a full dinner.  Discussed with the primary team (DrHobbs and Night coverage) the outstanding ECHO order.  Both assured me that the patient is cleared for surgery. Discussed with Dr [name] who will perform the surgery in AM.  PLan I+D and deep cultures as well as aspiration of ankle joint. Will order CT for tonight to eval abscess location.  Jeffery Simon,[name] 04/24/2016, 8:49 PM

## 2016-04-25 NOTE — Anesthesia Procedure Notes (Signed)
Procedure Name: LMA Insertion Date/Time: 04/25/2016 7:53 AM Performed by: Greggory Stallion, Mohsin Crum L Pre-anesthesia Checklist: Patient identified, Emergency Drugs available, Suction available and Patient being monitored Patient Re-evaluated:Patient Re-evaluated prior to inductionOxygen Delivery Method: Circle System Utilized Preoxygenation: Pre-oxygenation with 100% oxygen Intubation Type: IV induction Ventilation: Mask ventilation with difficulty, Oral airway inserted - appropriate to patient size and Two handed mask ventilation required LMA: LMA inserted LMA Size: 5.0 Number of attempts: 1 Placement Confirmation: positive ETCO2 Tube secured with: Tape Dental Injury: Teeth and Oropharynx as per pre-operative assessment

## 2016-04-25 NOTE — Transfer of Care (Signed)
Immediate Anesthesia Transfer of Care Note  Patient: Jeffery Simon  Procedure(s) Performed: Procedure(s): IRRIGATION AND DEBRIDEMENT EXTREMITY (Left)  Patient Location: PACU  Anesthesia Type:General  Level of Consciousness: awake, alert , oriented and patient cooperative  Airway & Oxygen Therapy: Patient Spontanous Breathing and Patient connected to nasal cannula oxygen  Post-op Assessment: Report given to RN, Post -op Vital signs reviewed and stable and Patient moving all extremities  Post vital signs: Reviewed and stable  Last Vitals:  Vitals:   04/25/16 0648 04/25/16 0838  BP: (!) 169/98 (!) 145/84  Pulse: 86 85  Resp: 18 11  Temp: 36.7 C 37.1 C    Last Pain:  Vitals:   04/25/16 0838  TempSrc:   PainSc: 0-No pain         Complications: No apparent anesthesia complications

## 2016-04-25 NOTE — Interval H&P Note (Signed)
History and Physical Interval Note:  04/25/2016 7:40 AM  Jeffery Simon  has presented today for surgery, with the diagnosis of Left Ankle Abscess  The various methods of treatment have been discussed with the patient and family. After consideration of risks, benefits and other options for treatment, the patient has consented to  Procedure(s): IRRIGATION AND DEBRIDEMENT EXTREMITY (Left) as a surgical intervention .  The patient's history has been reviewed, patient examined, no change in status, stable for surgery.  I have reviewed the patient's chart and labs.  Questions were answered to the patient's satisfaction.     Jeffery Simon, Horald Pollen

## 2016-04-26 ENCOUNTER — Inpatient Hospital Stay (HOSPITAL_COMMUNITY): Payer: BLUE CROSS/BLUE SHIELD

## 2016-04-26 ENCOUNTER — Encounter (HOSPITAL_COMMUNITY): Payer: Self-pay | Admitting: Orthopedic Surgery

## 2016-04-26 DIAGNOSIS — Z6841 Body Mass Index (BMI) 40.0 and over, adult: Secondary | ICD-10-CM

## 2016-04-26 LAB — AEROBIC CULTURE  (SUPERFICIAL SPECIMEN)

## 2016-04-26 LAB — TSH: TSH: 3.419 u[IU]/mL (ref 0.350–4.500)

## 2016-04-26 LAB — AEROBIC CULTURE W GRAM STAIN (SUPERFICIAL SPECIMEN): Special Requests: NORMAL

## 2016-04-26 LAB — LIPID PANEL
CHOL/HDL RATIO: 4.2 ratio
Cholesterol: 151 mg/dL (ref 0–200)
HDL: 36 mg/dL — AB (ref >40)
LDL Cholesterol: 100 mg/dL — ABNORMAL HIGH (ref 0–99)
Triglycerides: 76 mg/dL (ref <150)
VLDL: 15 mg/dL (ref 0–40)

## 2016-04-26 LAB — CBC
HCT: 35.1 % — ABNORMAL LOW (ref 39.0–52.0)
Hemoglobin: 10.9 g/dL — ABNORMAL LOW (ref 13.0–17.0)
MCH: 29.7 pg (ref 26.0–34.0)
MCHC: 31.1 g/dL (ref 30.0–36.0)
MCV: 95.6 fL (ref 78.0–100.0)
Platelets: 214 10*3/uL (ref 150–400)
RBC: 3.67 MIL/uL — ABNORMAL LOW (ref 4.22–5.81)
RDW: 13.5 % (ref 11.5–15.5)
WBC: 7.5 10*3/uL (ref 4.0–10.5)

## 2016-04-26 NOTE — Progress Notes (Signed)
Subjective:  Patient reports pain as mild to moderate.  Feeling better.  Objective:   VITALS:   Vitals:   04/25/16 0900 04/25/16 2055 04/26/16 0031 04/26/16 0608  BP: (!) 144/79 (!) 146/76  (!) 147/83  Pulse: 76 78 80 68  Resp: 12 16 16 16   Temp: 97.7 F (36.5 C) 98.2 F (36.8 C)  97.8 F (36.6 C)  TempSrc:  Oral  Oral  SpO2: 98% 93% 94% 90%    NAD ABD soft Sensation intact distally Intact pulses distally Dorsiflexion/Plantar flexion intact Incision: dressing C/D/I Cellulitis improving   Lab Results  Component Value Date   WBC 7.5 04/26/2016   HGB 10.9 (L) 04/26/2016   HCT 35.1 (L) 04/26/2016   MCV 95.6 04/26/2016   PLT 214 04/26/2016   BMET    Component Value Date/Time   NA 139 04/25/2016 0436   K 3.5 04/25/2016 0436   CL 99 (L) 04/25/2016 0436   CO2 31 04/25/2016 0436   GLUCOSE 108 (H) 04/25/2016 0436   BUN 12 04/25/2016 0436   CREATININE 0.73 04/25/2016 0436   CALCIUM 8.6 (L) 04/25/2016 0436   GFRNONAA >60 04/25/2016 0436   GFRAA >60 04/25/2016 0436    Recent Results (from the past 240 hour(s))  Blood culture (routine x 2)     Status: None (Preliminary result)   Collection Time: 04/24/16  1:45 PM  Result Value Ref Range Status   Specimen Description BLOOD RIGHT HAND  Final   Special Requests BOTTLES DRAWN AEROBIC AND ANAEROBIC 5CC  Final   Culture NO GROWTH < 24 HOURS  Final   Report Status PENDING  Incomplete  Blood culture (routine x 2)     Status: None (Preliminary result)   Collection Time: 04/24/16  2:15 PM  Result Value Ref Range Status   Specimen Description BLOOD RIGHT HAND  Final   Special Requests BOTTLES DRAWN AEROBIC AND ANAEROBIC 5CC  Final   Culture NO GROWTH < 24 HOURS  Final   Report Status PENDING  Incomplete  Aerobic Culture (superficial specimen)     Status: None (Preliminary result)   Collection Time: 04/24/16  6:31 PM  Result Value Ref Range Status   Specimen Description LEG LEFT  Final   Special Requests Normal   Final   Gram Stain   Final    RARE WBC PRESENT, PREDOMINANTLY PMN RARE GRAM POSITIVE COCCI IN PAIRS    Culture   Final    MODERATE STAPHYLOCOCCUS AUREUS SUSCEPTIBILITIES TO FOLLOW    Report Status PENDING  Incomplete  Surgical pcr screen     Status: Abnormal   Collection Time: 04/24/16  9:05 PM  Result Value Ref Range Status   MRSA, PCR POSITIVE (A) NEGATIVE Final   Staphylococcus aureus POSITIVE (A) NEGATIVE Final    Comment:        The Xpert SA Assay (FDA approved for NASAL specimens in patients over 24 years of age), is one component of a comprehensive surveillance program.  Test performance has been validated by [facility] for patients greater than or equal to 81 year old. It is not intended to diagnose infection nor to guide or monitor treatment. RESULT CALLED TO, READ BACK BY AND VERIFIED WITH: R LEIGHT,RN AT 2322 04/24/16 BY J FUDESCO   Aerobic/Anaerobic Culture (surgical/deep wound)     Status: None (Preliminary result)   Collection Time: 04/25/16  7:26 AM  Result Value Ref Range Status   Specimen Description ABSCESS LEFT LOWER LEG  Final  Special Requests NONE  Final   Gram Stain   Final    DEGENERATED CELLULAR MATERIAL PRESENT FEW GRAM POSITIVE COCCI IN PAIRS IN CLUSTERS RARE SQUAMOUS EPITHELIAL CELLS PRESENT    Culture PENDING  Incomplete   Report Status PENDING  Incomplete      Assessment/Plan: 1 Day Post-Op   Principal Problem:   Left leg cellulitis Active Problems:   OSA (obstructive sleep apnea)   Varicose veins of bilateral lower extremities with other complications   HTN (hypertension)   Morbid obesity with BMI of 50.0-59.9, adult (HCC)   Transaminitis   Cellulitis and abscess of left leg   Cont IV abx Daily WC: pack with 1/4" nu-gauze; cover with ABDs and Ace wrap Follow culture: I suspect he will grow MRSA   Coren Crownover, Horald Pollen 04/26/2016, 12:21 PM   Rod Can, MD Cell [phone]

## 2016-04-26 NOTE — Progress Notes (Signed)
PROGRESS NOTE  Jeffery Simon  YWV:[id number] DOB: 1965-09-10 DOA: 04/24/2016 PCP: Greig Right, MD   Brief Narrative: Jeffery Simon is a 51 y.o. male with a medical history significant for HTN, severe OSA on CPAP, and morbid obesity who presented 3/31 with a painful wound on the left ankle worsening over 1 week that began draining pus for 24 hours. He has been followed by Dr. Lawson/[facility] for bilateral lower extremity edema, having undergone laser ablation to the left leg in January and to the right leg earlier this month. On arrival he was afebrile, HR 79, BP 150/76. Left ankle XR showed soft tissue swelling consistent with cellulitis without any emphysema or acute osseous abnormality. Subsequent CT showed circumferential severe soft tissue edema with posteromedial ankle fluid collection. Vancomycin and rocephin were given and orthopedics was consulted. He underwent I&D with joint aspirate on 4/1 by Dr. [name]. Cultures are pending while the patient remains on IV antibiotics. Physical therapy has recommended outpatient physical therapy.   Assessment & Plan: Principal Problem:   Left leg cellulitis Active Problems:   OSA (obstructive sleep apnea)   Varicose veins of bilateral lower extremities with other complications   HTN (hypertension)   Morbid obesity with BMI of 50.0-59.9, adult (HCC)   Transaminitis   Cellulitis and abscess of left leg  Left ankle cellulitis and abscess: Seen on CT. No evidence of sepsis, afebrile, no leukocytosis. s/p I&D, joint aspirate 4/1 by Dr. [name]. - Treating empirically with IV abx: CTX and coverage for MRSA with vancomycin  - Await results of intraoperative culture and blood cultures.  - Continue NSAIDs, tylenol prn pain control. 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  - Daily wound dressing changes: pack with 1/4" nu-gauze; cover with ABDs and Ace wrap per orthopedics recommendations. May need  home health RN vs. referral to wound care center.   Chronic LE edema: Presumably due to venous insufficiency, worsened by morbid obesity. TSH wnl. Albumin wnl, creatinine wnl. - Echo to r/o RV failure - Follow-up with Dr. [name] after discharge - Check doppler LLE to r/o DVT given significant tender swelling extending past cellulitis.  OSA: Chronic, stable.  - Continue nocturnal CPAP - Echocardiogram: He has never undergone echocardiogram and given recurrent edema despite laser vein ablation patient may have undetected RHF/right systolic dysfunction  HTN: Control improving, elevated initially due to pain. - Continue thiazide diuretic  Morbid obesity: BMI reported >50. - Brief lifestyle change counseling provided.  - Significantly adversely affecting comorbidities.  - PT/OT as above: Recommending outpatient PT.   Transaminitis: Mild. Suspect related to undiagnosed NAFLD. - Consider outpatient abdominal ultrasound  DVT prophylaxis: SCDs Code Status: Full Family Communication: Wife at bedside Disposition Plan: Inpatient pending cultures  Consultants:   Orthopedic surgery, Dr. [name]  Procedures:   Left ankle I&D, joint aspiration 4/1  Antimicrobials:  Vancomycin 3/31  Ceftriaxone 3/31   Subjective: Pt without complaints. Pain is controlled, worked with physical therapy without issues. Wants to go home. Denies chest pain, dyspnea, orthopnea, PND. Leg swelling still near baseline on right, worse on left.   Objective: BP (!) 147/83 (BP Location: Right Arm)   Pulse 68   Temp 97.8 F (36.6 C) (Oral)   Resp 16   SpO2 90%    General exam: Morbidly obese 51yo male in no distress Respiratory system: Non-labored breathing room air. Clear to auscultation bilaterally, distant. Cardiovascular system: Regular rate and rhythm. No murmur, rub, or gallop. No  JVD, though difficult to appreciate due to habitus Gastrointestinal system: Abdomen obese, soft, non-tender,  non-distended, with normoactive bowel sounds. Central nervous system: Alert and oriented. No focal neurological deficits. Extremities: Left ankle with bulky ACE wrap dressing c/d/i, not taken down. Erythema receding from irregular demarcation on calf. Significant edema compared to right.  Skin: Diffuse varicosities in bilateral LE's.   Psychiatry: Judgement and insight appear normal. Mood & affect appropriate.   Data Reviewed: I have personally reviewed following labs and imaging studies  CBC:  Recent Labs Lab 04/24/16 1313 04/25/16 0436 04/26/16 0603  WBC 9.9 7.8 7.5  NEUTROABS 6.9  --   --   HGB 12.2* 11.2* 10.9*  HCT 37.2* 34.5* 35.1*  MCV 94.2 94.0 95.6  PLT 241 221 784   Basic Metabolic Panel:  Recent Labs Lab 04/24/16 1313 04/24/16 1903 04/25/16 0436  NA 137  --  139  K 4.2  --  3.5  CL 99*  --  99*  CO2 32  --  31  GLUCOSE 104*  --  108*  BUN 13  --  12  CREATININE 0.71  --  0.73  CALCIUM 9.2  --  8.6*  MG  --  2.2  --    GFR: Estimated Creatinine Clearance: 182.8 mL/min (by C-G formula based on SCr of 0.73 mg/dL). Liver Function Tests:  Recent Labs Lab 04/24/16 1313  AST 57*  ALT 113*  ALKPHOS 106  BILITOT 0.9  PROT 7.3  ALBUMIN 3.5   No results for input(s): LIPASE, AMYLASE in the last 168 hours. No results for input(s): AMMONIA in the last 168 hours. Coagulation Profile: No results for input(s): INR, PROTIME in the last 168 hours. Cardiac Enzymes: No results for input(s): CKTOTAL, CKMB, CKMBINDEX, TROPONINI in the last 168 hours. BNP (last 3 results) No results for input(s): PROBNP in the last 8760 hours. HbA1C: No results for input(s): HGBA1C in the last 72 hours. CBG: No results for input(s): GLUCAP in the last 168 hours. Lipid Profile:  Recent Labs  04/26/16 0603  CHOL 151  HDL 36*  LDLCALC 100*  TRIG 76  CHOLHDL 4.2   Thyroid Function Tests:  Recent Labs  04/26/16 0603  TSH 3.419   Anemia Panel: No results for input(s):  VITAMINB12, FOLATE, FERRITIN, TIBC, IRON, RETICCTPCT in the last 72 hours. Urine analysis: No results found for: COLORURINE, APPEARANCEUR, LABSPEC, PHURINE, GLUCOSEU, HGBUR, BILIRUBINUR, KETONESUR, PROTEINUR, UROBILINOGEN, NITRITE, LEUKOCYTESUR Recent Results (from the past 240 hour(s))  Blood culture (routine x 2)     Status: None (Preliminary result)   Collection Time: 04/24/16  1:45 PM  Result Value Ref Range Status   Specimen Description BLOOD RIGHT HAND  Final   Special Requests BOTTLES DRAWN AEROBIC AND ANAEROBIC 5CC  Final   Culture NO GROWTH 2 DAYS  Final   Report Status PENDING  Incomplete  Blood culture (routine x 2)     Status: None (Preliminary result)   Collection Time: 04/24/16  2:15 PM  Result Value Ref Range Status   Specimen Description BLOOD RIGHT HAND  Final   Special Requests BOTTLES DRAWN AEROBIC AND ANAEROBIC 5CC  Final   Culture NO GROWTH 2 DAYS  Final   Report Status PENDING  Incomplete  Aerobic Culture (superficial specimen)     Status: None   Collection Time: 04/24/16  6:31 PM  Result Value Ref Range Status   Specimen Description LEG LEFT  Final   Special Requests Normal  Final   Gram Stain  Final    RARE WBC PRESENT, PREDOMINANTLY PMN RARE GRAM POSITIVE COCCI IN PAIRS    Culture   Final    MODERATE METHICILLIN RESISTANT STAPHYLOCOCCUS AUREUS   Report Status 04/26/2016 FINAL  Final   Organism ID, Bacteria METHICILLIN RESISTANT STAPHYLOCOCCUS AUREUS  Final      Susceptibility   Methicillin resistant staphylococcus aureus - MIC*    CIPROFLOXACIN >=8 RESISTANT Resistant     ERYTHROMYCIN >=8 RESISTANT Resistant     GENTAMICIN <=0.5 SENSITIVE Sensitive     OXACILLIN >=4 RESISTANT Resistant     TETRACYCLINE <=1 SENSITIVE Sensitive     VANCOMYCIN 1 SENSITIVE Sensitive     TRIMETH/SULFA <=10 SENSITIVE Sensitive     CLINDAMYCIN <=0.25 SENSITIVE Sensitive     RIFAMPIN <=0.5 SENSITIVE Sensitive     Inducible Clindamycin NEGATIVE Sensitive     * MODERATE  METHICILLIN RESISTANT STAPHYLOCOCCUS AUREUS  Surgical pcr screen     Status: Abnormal   Collection Time: 04/24/16  9:05 PM  Result Value Ref Range Status   MRSA, PCR POSITIVE (A) NEGATIVE Final   Staphylococcus aureus POSITIVE (A) NEGATIVE Final    Comment:        The Xpert SA Assay (FDA approved for NASAL specimens in patients over 53 years of age), is one component of a comprehensive surveillance program.  Test performance has been validated by [facility] for patients greater than or equal to 74 year old. It is not intended to diagnose infection nor to guide or monitor treatment. RESULT CALLED TO, READ BACK BY AND VERIFIED WITH: R LEIGHT,RN AT 2322 04/24/16 BY J FUDESCO   Aerobic/Anaerobic Culture (surgical/deep wound)     Status: None (Preliminary result)   Collection Time: 04/25/16  7:26 AM  Result Value Ref Range Status   Specimen Description ABSCESS LEFT LOWER LEG  Final   Special Requests NONE  Final   Gram Stain   Final    DEGENERATED CELLULAR MATERIAL PRESENT FEW GRAM POSITIVE COCCI IN PAIRS IN CLUSTERS RARE SQUAMOUS EPITHELIAL CELLS PRESENT    Culture   Final    MODERATE UNIDENTIFIED ORGANISM IDENTIFICATION AND SUSCEPTIBILITIES TO FOLLOW    Report Status PENDING  Incomplete      Radiology Studies: Dg Ankle Complete Left  Result Date: 04/24/2016 CLINICAL DATA:  Left lower leg swelling, redness, and drainage. Cellulitis. Vein surgery 2 months ago. EXAM: LEFT ANKLE COMPLETE - 3+ VIEW COMPARISON:  None. FINDINGS: The soft tissue swelling and subcutaneous fat reticulation are present throughout the visualized lower leg, ankle, and dorsum of the foot. No soft tissue emphysema is identified. No acute fracture or dislocation is seen. Mild degenerative spurring is noted in the midfoot, and there are small posterior and moderate plantar calcaneal enthesophytes. No radiopaque foreign body. IMPRESSION: Diffuse cellulitic type changes without evidence of soft tissue emphysema or  acute osseous abnormality. Electronically Signed   By: Logan Bores M.D.   On: 04/24/2016 13:58   Ct Ankle Left Wo Contrast  Result Date: 04/25/2016 CLINICAL DATA:  Left ankle abscess. EXAM: CT OF THE LEFT ANKLE WITHOUT CONTRAST TECHNIQUE: Multidetector CT imaging of the left ankle was performed according to the standard protocol. Multiplanar CT image reconstructions were also generated. COMPARISON:  None. FINDINGS: Bones/Joint/Cartilage No fracture or dislocation. Normal alignment. No joint effusion. No periosteal reaction or bone destruction. No aggressive lytic or sclerotic osseous lesion. Ankle mortise is intact. Subtalar joints are normal. Old posttraumatic deformity of the anterior process of the calcaneus. Ligaments Ligaments are suboptimally evaluated  by CT. Muscles and Tendons Muscles are normal. No significant muscle atrophy. Flexor, extensor, peroneal and Achilles tendons are grossly intact. Soft tissue No fluid collection or hematoma. No soft tissue mass. Severe soft tissue edema in the subcutaneous fat circumferentially around the ankle and extending into the foot. Focal area of fluid in the posteromedial ankle soft tissues measuring approximately 2 cm . IMPRESSION: 1. Severe soft tissue edema in the subcutaneous fat circumferentially around the ankle and extending into the foot most concerning for cellulitis. Focal area of fluid in the posteromedial ankle soft tissues measuring approximately 2 cm concerning for an small abscess. Electronically Signed   By: Kathreen Devoid   On: 04/25/2016 08:59    Scheduled Meds: . Chlorhexidine Gluconate Cloth  6 each Topical Q0600  . diclofenac  150 mg Oral Daily  . hydrochlorothiazide  25 mg Oral Daily  . mupirocin ointment  1 application Nasal BID  . omega-3 acid ethyl esters  1 g Oral Daily  . vancomycin  1,500 mg Intravenous Q12H   Continuous Infusions:   LOS: 1 day   Time spent: 25 minutes.  Vance Gather, MD [facility] Pager  [phone]  If 7PM-7AM, please contact night-coverage [email or web address] Password [email or web address] 04/26/2016, 1:23 PM

## 2016-04-26 NOTE — Progress Notes (Signed)
Physical Therapy Evaluation Patient Details Name: Jeffery Simon MRN: [id number] DOB: 24-Jun-1965 Today's Date: 04/26/2016   History of Present Illness  Jeffery Simon is a 51 y.o. male with a Past Medical History sig for HTN, OSA who presents with ankle abscess with cellulitis.. Now s/p ankle I&D tissue samples sent out for cultures.  Clinical Impression  Pt presented alert and oriented, sitting in chair with pain rating of 0. Patient was independent with sit to stand, stand to sit, ambulation and stair navigation using both rails. Patient met all PT goals for discharge to home but would benefit from outpatient physical therapy to assess the stability and strength of his left ankle to increase safety and mobility and enhance QOL. Potential need for outpatient PT can be addressed at ortho follow-up. No acute PT need identified; will sign off. Patient is safe to ambulate in halls independently; educated in following contact precautions.     Follow Up Recommendations Outpatient PT    Equipment Recommendations  None recommended by PT    Recommendations for Other Services       Precautions / Restrictions Restrictions Weight Bearing Restrictions: No      Mobility  Bed Mobility                  Transfers Overall transfer level: Independent Equipment used: None                Ambulation/Gait Ambulation/Gait assistance: Independent Ambulation Distance (Feet): 150 Feet Assistive device: None   Gait velocity: WFL Gait velocity interpretation: >2.62 ft/sec, indicative of independent community ambulator General Gait Details: Slight left trunk lean in stance   Stairs Stairs: Yes Stairs assistance: Independent Stair Management: Two rails Number of Stairs: 8    Wheelchair Mobility    Modified Rankin (Stroke Patients Only)       Balance Overall balance assessment: Independent                                           Pertinent Vitals/Pain  Pain Assessment: No/denies pain    Home Living Family/patient expects to be discharged to:: Private residence Living Arrangements: Spouse/significant other Available Help at Discharge: Family Type of Home: House Home Access: Stairs to enter Entrance Stairs-Rails: Can reach both Entrance Stairs-Number of Steps: 8-10          Prior Function Level of Independence: Independent               Hand Dominance        Extremity/Trunk Assessment   Upper Extremity Assessment Upper Extremity Assessment: Overall WFL for tasks assessed    Lower Extremity Assessment Lower Extremity Assessment: Overall WFL for tasks assessed    Cervical / Trunk Assessment Cervical / Trunk Assessment: Normal  Communication   Communication: No difficulties  Cognition Arousal/Alertness: Awake/alert Behavior During Therapy: WFL for tasks assessed/performed Overall Cognitive Status: Within Functional Limits for tasks assessed                                        General Comments General comments (skin integrity, edema, etc.): Left leg adema,     Exercises     Assessment/Plan    PT Assessment All further PT needs can be met in the next venue of care  PT Problem List  PT Treatment Interventions      PT Goals (Current goals can be found in the Care Plan section)  Acute Rehab PT Goals Patient Stated Goal: Return to home PT Goal Formulation: With patient Time For Goal Achievement: 04/26/16 Potential to Achieve Goals: Good    Frequency     Barriers to discharge        Co-evaluation     PT goals addressed during session: Mobility/safety with mobility         End of Session Equipment Utilized During Treatment: Gait belt Activity Tolerance: Patient tolerated treatment well Patient left: in chair;with call bell/phone within reach        Time: 1030-1056 PT Time Calculation (min) (ACUTE ONLY): 26 min   Charges:         PT G Codes:         Drue Stager SPT Acute Rehabilitation Services Office: [phone]  Drue Stager SPT 04/26/2016, 11:18 AM  Rehabilitation Services: [phone]

## 2016-04-26 NOTE — Evaluation (Signed)
Occupational Therapy Evaluation Patient Details Name: Jeffery Simon MRN: [id number] DOB: 1965/06/03 Today's Date: 04/26/2016    History of Present Illness Jeffery Simon is a 51 y.o. male with a Past Medical History sig for HTN, OSA who presents with ankle abscess with cellulitis.. Now s/p ankle I&D tissue samples sent out for cultures.   Clinical Impression   Pt admitted with the above diagnoses and presents with below problem list. PTA pt was independent with ADLs. Pt is currently at/close to baseline with ADLs. Reviewed ADL education, fall prevention, and recommendation to sit to shower once cleared by MD to shower. No further OT needs at this time. OT signing off.     Follow Up Recommendations  No OT follow up    Equipment Recommendations  None recommended by OT    Recommendations for Other Services       Precautions / Restrictions Restrictions Weight Bearing Restrictions: No      Mobility Bed Mobility  Up in chair                  Transfers Overall transfer level: Independent Equipment used: None                  Balance Overall balance assessment: Independent                                         ADL either performed or assessed with clinical judgement   ADL Overall ADL's : Modified independent;At baseline                                       General ADL Comments: Pt at/close to baseline with ADLs. Reviewed LB ADL techniques and safety at home. Discussed sitting to shower once cleared by MD to shower.     Vision         Perception     Praxis      Pertinent Vitals/Pain Pain Assessment: No/denies pain     Hand Dominance     Extremity/Trunk Assessment Upper Extremity Assessment Upper Extremity Assessment: Overall WFL for tasks assessed   Lower Extremity Assessment Lower Extremity Assessment: Defer to PT evaluation   Cervical / Trunk Assessment Cervical / Trunk Assessment: Normal    Communication Communication Communication: No difficulties   Cognition Arousal/Alertness: Awake/alert Behavior During Therapy: WFL for tasks assessed/performed Overall Cognitive Status: Within Functional Limits for tasks assessed                                     General Comments       Exercises     Shoulder Instructions      Home Living Family/patient expects to be discharged to:: Private residence Living Arrangements: Spouse/significant other Available Help at Discharge: Family Type of Home: House Home Access: Stairs to enter Technical brewer of Steps: 8-10 Entrance Stairs-Rails: Can reach both Home Layout: Two level;Able to live on main level with bedroom/bathroom     Bathroom Shower/Tub: Occupational psychologist: Standard     Home Equipment: Crutches;Shower seat - built in          Prior Functioning/Environment Level of Independence: Independent  OT Problem List:        OT Treatment/Interventions:      OT Goals(Current goals can be found in the care plan section) Acute Rehab OT Goals Patient Stated Goal: Return to home  OT Frequency:     Barriers to D/C:            Co-evaluation              End of Session    Activity Tolerance: Patient tolerated treatment well Patient left: in chair;with call bell/phone within reach  OT Visit Diagnosis: Pain Pain - Right/Left: Left Pain - part of body: Ankle and joints of foot                Time: 4037-0964 OT Time Calculation (min): 10 min Charges:  OT General Charges $OT Visit: 1 Procedure OT Evaluation $OT Eval Low Complexity: 1 Procedure G-Codes:       Jeffery Simon 04/26/2016, 2:02 PM

## 2016-04-26 NOTE — Progress Notes (Signed)
CPAP is set up and ready for use at patient's bedside. Patient stated he would place on himself when he goes to bed. RT advised patient to have RT called if assistance is needed. RT will monitor as needed.

## 2016-04-27 ENCOUNTER — Inpatient Hospital Stay (HOSPITAL_COMMUNITY): Payer: BLUE CROSS/BLUE SHIELD

## 2016-04-27 ENCOUNTER — Encounter (HOSPITAL_COMMUNITY): Payer: Self-pay | Admitting: General Practice

## 2016-04-27 DIAGNOSIS — M79609 Pain in unspecified limb: Secondary | ICD-10-CM

## 2016-04-27 DIAGNOSIS — I270 Primary pulmonary hypertension: Secondary | ICD-10-CM

## 2016-04-27 DIAGNOSIS — M7989 Other specified soft tissue disorders: Secondary | ICD-10-CM

## 2016-04-27 LAB — ECHOCARDIOGRAM COMPLETE

## 2016-04-27 LAB — COMPREHENSIVE METABOLIC PANEL
ALK PHOS: 105 U/L (ref 38–126)
ALT: 62 U/L (ref 17–63)
ANION GAP: 6 (ref 5–15)
AST: 26 U/L (ref 15–41)
Albumin: 3.3 g/dL — ABNORMAL LOW (ref 3.5–5.0)
BUN: 9 mg/dL (ref 6–20)
CO2: 30 mmol/L (ref 22–32)
CREATININE: 0.67 mg/dL (ref 0.61–1.24)
Calcium: 8.6 mg/dL — ABNORMAL LOW (ref 8.9–10.3)
Chloride: 100 mmol/L — ABNORMAL LOW (ref 101–111)
Glucose, Bld: 102 mg/dL — ABNORMAL HIGH (ref 65–99)
Potassium: 3.9 mmol/L (ref 3.5–5.1)
Sodium: 136 mmol/L (ref 135–145)
Total Bilirubin: 0.7 mg/dL (ref 0.3–1.2)
Total Protein: 7.1 g/dL (ref 6.5–8.1)

## 2016-04-27 LAB — HEMOGLOBIN A1C
Hgb A1c MFr Bld: 5.6 % (ref 4.8–5.6)
Mean Plasma Glucose: 114 mg/dL

## 2016-04-27 LAB — CBC
HCT: 36.7 % — ABNORMAL LOW (ref 39.0–52.0)
HEMOGLOBIN: 11.8 g/dL — AB (ref 13.0–17.0)
MCH: 30.3 pg (ref 26.0–34.0)
MCHC: 32.2 g/dL (ref 30.0–36.0)
MCV: 94.1 fL (ref 78.0–100.0)
PLATELETS: 234 10*3/uL (ref 150–400)
RBC: 3.9 MIL/uL — AB (ref 4.22–5.81)
RDW: 13.5 % (ref 11.5–15.5)
WBC: 8.9 10*3/uL (ref 4.0–10.5)

## 2016-04-27 MED ORDER — PERFLUTREN LIPID MICROSPHERE
1.0000 mL | INTRAVENOUS | Status: AC | PRN
  Administered 2016-04-27: 3 mL via INTRAVENOUS
  Filled 2016-04-27: qty 10

## 2016-04-27 MED ORDER — DOXYCYCLINE HYCLATE 100 MG PO CAPS: 100.0000 mg | ORAL_CAPSULE | Freq: Two times a day (BID) | ORAL | 0 refills | Status: DC

## 2016-04-27 NOTE — Care Management Note (Signed)
Case Management Note  Patient Details  Name: Jeffery Simon MRN: [id number] Date of Birth: April 09, 1965  Subjective/Objective:     51 yr old gentleman s/p I & D of Left ankle with drain of abscess.    Action/Plan: Patient will need wound care center appointment and outpatient therapy arranged. CM has spoken with Dr. Bonner Puna and informed him that wound care center will have to be contacted in the AM, they close at [place]. Bedside RN will instruct patient and wife on how to do do dressing changes. Patient politely declines being setup for outpatient therapy, he states he is fine.   Expected Discharge Date:  04/27/16               Expected Discharge Plan:  [place]  In-House Referral:  NA  Discharge planning Services  CM Consult, Follow-up appt scheduled (CM to arrange wound care appointment and outpatient therapy)  Post Acute Care Choice:  Home Health Choice offered to:  Patient  DME Arranged:  N/A DME Agency:     HH Arranged:   RN [place] Agency:     Status of Service:  In process, will continue to follow  If discussed at Long Length of Stay Meetings, dates discussed:    Additional Comments:  Ninfa Meeker, RN 04/27/2016, 5:19 PM

## 2016-04-27 NOTE — Progress Notes (Signed)
**  Preliminary report by tech**  Left lower extremity venous duplex complete. There is no obvious evidence of deep or superficial vein thrombosis involving the left lower extremity. All clearly visualized vessels appear patent and compressible. There is no evidence of a Baker's cyst on the left.  04/27/16 2:45 PM Jeffery Simon RVT

## 2016-04-27 NOTE — Discharge Summary (Signed)
Physician Discharge Summary  [name] LOV:[id number] DOB: Jun 02, 1965 DOA: 04/24/2016  PCP: Greig Right, MD  Admit date: 04/24/2016 Discharge date: 04/27/2016  Admitted From: Home Disposition: Home   Recommendations for Outpatient Follow-up:  1. Follow up with PCP in 1-2 weeks 2. Follow up with Dr. Veverly Fells in 1 week 3. Continue wound care at [facility] 4. Continue doxycycline x14 days to treat LLE cellulitis, MRSA abscess.  5. Please obtain BMP/CBC in one week. 6. Consider outpatient abdominal ultrasound to evaluate mild transaminitis. 7. Please follow up on the following pending results: Blood cultures (3/31, NGTD at time of discharge)  Home Health: Outpatient PT, HH-RN for wound care Equipment/Devices: None Discharge Condition: Stable CODE STATUS: Full Diet recommendation: Heart healthy  Brief/Interim Summary: Jeffery Simon [place] a 51 y.o.malewith a medical history significant for HTN, severe OSA on CPAP, and morbidobesity who presented 3/31 with a painful wound on the left ankle worsening over 1 week that began draining pus for 24 hours. He has been followed by Dr. Lawson/VVSfor bilateral lower extremity edema, having undergone laser ablation to the left leg in January and to the right leg earlier this month. On arrival he was afebrile, HR 79, BP 150/76. Left ankle XR showed soft tissue swelling consistent with cellulitis without any emphysema or acute osseous abnormality. Subsequent CT showed circumferential severe soft tissue edema with posteromedial ankle fluid collection. Vancomycin and rocephin were given and orthopedics was consulted. He underwent I&D with joint aspirate on 4/1 by Dr. [name]. IV antibiotics were continued for 3 days and operative cultures have grown MRSA with sensitivities as below. Cellulitis has receded from demarcations at admission, but remains, so the patient will be discharged on 2 weeks of doxycycline per sensitivity data. He will  follow up with the wound care center as an outpatient and follow up with Dr. Veverly Fells in 1 week per orthopedics recommendations. Physical therapy has recommended outpatient physical therapy, which is arranged prior to discharge.   Discharge Diagnoses:  Principal Problem:   Left leg cellulitis Active Problems:   OSA (obstructive sleep apnea)   Varicose veins of bilateral lower extremities with other complications   HTN (hypertension)   Morbid obesity with BMI of 50.0-59.9, adult (HCC)   Transaminitis   Cellulitis and abscess of left leg  Left ankle cellulitis and MRSA abscess: Seen on CT, culture-proven as below. No evidence of sepsis, afebrile, no leukocytosis. s/p I&D, joint aspirate 4/1 by Dr. [name]. Improving on CTX and coverage for MRSA with vancomycin  - Continue NSAIDs, tylenol prn pain control. Pt is CDL truck driver, prefers to avoid narcotics. He was given only 2 doses of prn norco on 4/1, and none since. Declines prescription at discharge.  - Daily wound dressing changes to be arranged by case management at [place]: pack with 1/4" nu-gauze; cover with ABDs and Ace wrap per orthopedics recommendations.   Chronic LE edema: Presumably due to venous insufficiency, worsened by morbid obesity. TSH wnl. Albumin wnl, creatinine wnl. Acutely worsened due to cellulitis. - Echo to r/o RV failure, performed 4/3 - Follow-up with Dr. [name] after discharge  OSA: Chronic, stable.  - Continue nocturnal CPAP - Echocardiogram was difficult to interpret due to habitus. PE pressure unable to be determined. EF 50-55%, G2DD.   HTN: Control improving, elevated initially due to pain. - Continue thiazide diuretic  Morbid obesity: BMI reported >50. - Brief lifestyle change counseling provided.  - Significantly adversely affecting comorbidities.  - PT/OT as above: Recommending outpatient  PT.   Transaminitis: Mild. Suspect related to undiagnosed NAFLD. - Consider outpatient abdominal  ultrasound  Discharge Instructions Discharge Instructions    Diet - low sodium heart healthy    Complete by:  As directed    Discharge instructions    Complete by:  As directed    You were admitted with skin infection treated with operative drainage of abscess and IV antibiotics. The culture from the surgery has shown that the infection is being adequately covered with these antibiotics and will also be sensitive to doxycycline, which you will take twice daily for 14 days.  - Follow up with the wound care center as an outpatient. Care management will arrange this for you prior to discharge.  - Follow up with Dr. Veverly Fells in the next week.  - You will get outpatient PT arranged prior to discharge as well.  - If you develop fever, the redness or pain starts getting worse, or you develop trouble breathing or chest pain, seek medical attention right away.   Increase activity slowly    Complete by:  As directed      Allergies as of 04/27/2016   No Known Allergies     Medication List    TAKE these medications   diclofenac 75 MG EC tablet Commonly known as:  VOLTAREN Take 150 mg by mouth daily.   doxycycline 100 MG capsule Commonly known as:  VIBRAMYCIN Take 1 capsule (100 mg total) by mouth 2 (two) times daily.   fish oil-omega-3 fatty acids 1000 MG capsule Take 1 g by mouth daily.   GARLIC PO Take 1 tablet by mouth daily.   hydrochlorothiazide 25 MG tablet Commonly known as:  HYDRODIURIL Take 25 mg by mouth daily.   ibuprofen 200 MG tablet Commonly known as:  ADVIL,MOTRIN Take 400 mg by mouth every 6 (six) hours as needed for headache or moderate pain.   MULTIPLE VITAMIN PO Take 1 tablet by mouth daily.      Follow-up Information    NORRIS,[name], MD. Schedule an appointment as soon as possible for a visit in 10 day(s).   Specialty:  Orthopedic Surgery Why:  For wound re-check Contact information: [address] [place] [address] [place]  [address] [phone]          No Known Allergies  Consultations:  Orthopedics, Dr. [name]  Procedures/Studies: Dg Ankle Complete Left  Result Date: 04/24/2016 CLINICAL DATA:  Left lower leg swelling, redness, and drainage. Cellulitis. Vein surgery 2 months ago. EXAM: LEFT ANKLE COMPLETE - 3+ VIEW COMPARISON:  None. FINDINGS: The soft tissue swelling and subcutaneous fat reticulation are present throughout the visualized lower leg, ankle, and dorsum of the foot. No soft tissue emphysema is identified. No acute fracture or dislocation is seen. Mild degenerative spurring is noted in the midfoot, and there are small posterior and moderate plantar calcaneal enthesophytes. No radiopaque foreign body. IMPRESSION: Diffuse cellulitic type changes without evidence of soft tissue emphysema or acute osseous abnormality. Electronically Signed   By: Logan Bores M.D.   On: 04/24/2016 13:58   Ct Ankle Left Wo Contrast  Result Date: 04/25/2016 CLINICAL DATA:  Left ankle abscess. EXAM: CT OF THE LEFT ANKLE WITHOUT CONTRAST TECHNIQUE: Multidetector CT imaging of the left ankle was performed according to the standard protocol. Multiplanar CT image reconstructions were also generated. COMPARISON:  None. FINDINGS: Bones/Joint/Cartilage No fracture or dislocation. Normal alignment. No joint effusion. No periosteal reaction or bone destruction. No aggressive lytic or sclerotic osseous lesion. Ankle mortise is  intact. Subtalar joints are normal. Old posttraumatic deformity of the anterior process of the calcaneus. Ligaments Ligaments are suboptimally evaluated by CT. Muscles and Tendons Muscles are normal. No significant muscle atrophy. Flexor, extensor, peroneal and Achilles tendons are grossly intact. Soft tissue No fluid collection or hematoma. No soft tissue mass. Severe soft tissue edema in the subcutaneous fat circumferentially around the ankle and extending into the foot. Focal area of fluid in the posteromedial  ankle soft tissues measuring approximately 2 cm . IMPRESSION: 1. Severe soft tissue edema in the subcutaneous fat circumferentially around the ankle and extending into the foot most concerning for cellulitis. Focal area of fluid in the posteromedial ankle soft tissues measuring approximately 2 cm concerning for an small abscess. Electronically Signed   By: Kathreen Devoid   On: 04/25/2016 08:59   Echocardiogram 4/3:  - Left ventricle: The cavity size was at the upper limits of   normal. Wall thickness was normal. The estimated ejection   fraction was in the range of 50% to 55%. Although no diagnostic   regional wall motion abnormality was identified, this possibility   cannot be completely excluded on the basis of this study.   Features are consistent with a pseudonormal left ventricular   filling pattern, with concomitant abnormal relaxation and   increased filling pressure (grade 2 diastolic dysfunction). - Left atrium: The atrium was mildly dilated. - Right ventricle: The cavity size was mildly dilated. - Pulmonary arteries: Systolic pressure could not be accurately   estimated due to absence of TR jet.  Impressions:  - Even after administration of Definity, the study is barely   interpretable, due to technical limitations related to body    habitus.  Subjective: Patient without complaints. Pain controlled on tylenol and ibuprofen. Ambulating, eating, +BM.   Discharge Exam: Vitals:   04/26/16 2125 04/27/16 0607  BP:  (!) 151/91  Pulse: 84 92  Resp: 16 16  Temp:  98.2 F (36.8 C)   General: Pleasant, morbidly obese 51yo male in no distress Cardiovascular: RRR, no murmur or gallop. No JVD. Respiratory: Nonlabored on room air, clear Abdominal: Soft, NT, ND, bowel sounds + Extremities: Left ankle with bulky ACE wrap dressing c/d/i, not taken down. Erythema receding from irregular demarcation on calf. Significant edema compared to right.  Skin: Diffuse varicosities in bilateral  LE's.    Labs: Basic Metabolic Panel:  Recent Labs Lab 04/24/16 1313 04/24/16 1903 04/25/16 0436 04/27/16 0721  NA 137  --  139 136  K 4.2  --  3.5 3.9  CL 99*  --  99* 100*  CO2 32  --  31 30  GLUCOSE 104*  --  108* 102*  BUN 13  --  12 9  CREATININE 0.71  --  0.73 0.67  CALCIUM 9.2  --  8.6* 8.6*  MG  --  2.2  --   --    Liver Function Tests:  Recent Labs Lab 04/24/16 1313 04/27/16 0721  AST 57* 26  ALT 113* 62  ALKPHOS 106 105  BILITOT 0.9 0.7  PROT 7.3 7.1  ALBUMIN 3.5 3.3*   CBC:  Recent Labs Lab 04/24/16 1313 04/25/16 0436 04/26/16 0603 04/27/16 0721  WBC 9.9 7.8 7.5 8.9  NEUTROABS 6.9  --   --   --   HGB 12.2* 11.2* 10.9* 11.8*  HCT 37.2* 34.5* 35.1* 36.7*  MCV 94.2 94.0 95.6 94.1  PLT 241 221 214 234   Hgb A1c  Recent Labs  04/26/16  0603  HGBA1C 5.6   Lipid Profile  Recent Labs  04/26/16 0603  CHOL 151  HDL 36*  LDLCALC 100*  TRIG 76  CHOLHDL 4.2   Thyroid function studies  Recent Labs  04/26/16 0603  TSH 3.419   Microbiology Recent Results (from the past 240 hour(s))  Blood culture (routine x 2)     Status: None (Preliminary result)   Collection Time: 04/24/16  1:45 PM  Result Value Ref Range Status   Specimen Description BLOOD RIGHT HAND  Final   Special Requests BOTTLES DRAWN AEROBIC AND ANAEROBIC 5CC  Final   Culture NO GROWTH 3 DAYS  Final   Report Status PENDING  Incomplete  Blood culture (routine x 2)     Status: None (Preliminary result)   Collection Time: 04/24/16  2:15 PM  Result Value Ref Range Status   Specimen Description BLOOD RIGHT HAND  Final   Special Requests BOTTLES DRAWN AEROBIC AND ANAEROBIC 5CC  Final   Culture NO GROWTH 3 DAYS  Final   Report Status PENDING  Incomplete  Aerobic Culture (superficial specimen)     Status: None   Collection Time: 04/24/16  6:31 PM  Result Value Ref Range Status   Specimen Description LEG LEFT  Final   Special Requests Normal  Final   Gram Stain   Final    RARE  WBC PRESENT, PREDOMINANTLY PMN RARE GRAM POSITIVE COCCI IN PAIRS    Culture   Final    MODERATE METHICILLIN RESISTANT STAPHYLOCOCCUS AUREUS   Report Status 04/26/2016 FINAL  Final   Organism ID, Bacteria METHICILLIN RESISTANT STAPHYLOCOCCUS AUREUS  Final      Susceptibility   Methicillin resistant staphylococcus aureus - MIC*    CIPROFLOXACIN >=8 RESISTANT Resistant     ERYTHROMYCIN >=8 RESISTANT Resistant     GENTAMICIN <=0.5 SENSITIVE Sensitive     OXACILLIN >=4 RESISTANT Resistant     TETRACYCLINE <=1 SENSITIVE Sensitive     VANCOMYCIN 1 SENSITIVE Sensitive     TRIMETH/SULFA <=10 SENSITIVE Sensitive     CLINDAMYCIN <=0.25 SENSITIVE Sensitive     RIFAMPIN <=0.5 SENSITIVE Sensitive     Inducible Clindamycin NEGATIVE Sensitive     * MODERATE METHICILLIN RESISTANT STAPHYLOCOCCUS AUREUS  Surgical pcr screen     Status: Abnormal   Collection Time: 04/24/16  9:05 PM  Result Value Ref Range Status   MRSA, PCR POSITIVE (A) NEGATIVE Final   Staphylococcus aureus POSITIVE (A) NEGATIVE Final    Comment:        The Xpert SA Assay (FDA approved for NASAL specimens in patients over 3 years of age), is one component of a comprehensive surveillance program.  Test performance has been validated by [facility] for patients greater than or equal to 83 year old. It is not intended to diagnose infection nor to guide or monitor treatment. RESULT CALLED TO, READ BACK BY AND VERIFIED WITH: R LEIGHT,RN AT 2322 04/24/16 BY J FUDESCO   Aerobic/Anaerobic Culture (surgical/deep wound)     Status: None (Preliminary result)   Collection Time: 04/25/16  7:26 AM  Result Value Ref Range Status   Specimen Description ABSCESS LEFT LOWER LEG  Final   Special Requests NONE  Final   Gram Stain   Final    DEGENERATED CELLULAR MATERIAL PRESENT FEW GRAM POSITIVE COCCI IN PAIRS IN CLUSTERS RARE SQUAMOUS EPITHELIAL CELLS PRESENT    Culture   Final    MODERATE METHICILLIN RESISTANT STAPHYLOCOCCUS AUREUS NO  ANAEROBES ISOLATED; CULTURE IN PROGRESS  FOR 5 DAYS    Report Status PENDING  Incomplete   Organism ID, Bacteria METHICILLIN RESISTANT STAPHYLOCOCCUS AUREUS  Final      Susceptibility   Methicillin resistant staphylococcus aureus - MIC*    CIPROFLOXACIN >=8 RESISTANT Resistant     ERYTHROMYCIN >=8 RESISTANT Resistant     GENTAMICIN <=0.5 SENSITIVE Sensitive     OXACILLIN >=4 RESISTANT Resistant     TETRACYCLINE <=1 SENSITIVE Sensitive     VANCOMYCIN <=0.5 SENSITIVE Sensitive     TRIMETH/SULFA <=10 SENSITIVE Sensitive     CLINDAMYCIN <=0.25 SENSITIVE Sensitive     RIFAMPIN <=0.5 SENSITIVE Sensitive     Inducible Clindamycin NEGATIVE Sensitive     * MODERATE METHICILLIN RESISTANT STAPHYLOCOCCUS AUREUS    Time coordinating discharge: Approximately 40 minutes  Vance Gather, MD  [facility] 04/27/2016, 5:16 PM Pager [phone]

## 2016-04-27 NOTE — Progress Notes (Signed)
Subjective:  Patient reports pain as mild to moderate.  Feeling better.  Objective:   VITALS:   Vitals:   04/26/16 1633 04/26/16 2117 04/26/16 2125 04/27/16 0607  BP: (!) 141/64 (!) 165/83  (!) 151/91  Pulse:  83 84 92  Resp: 18 18 16 16   Temp: 98.7 F (37.1 C) 98.4 F (36.9 C)  98.2 F (36.8 C)  TempSrc: Oral Oral  Oral  SpO2: 99% 94% 95% 92%    NAD ABD soft Sensation intact distally Intact pulses distally Dorsiflexion/Plantar flexion intact Incision: dressing C/D/I Cellulitis improving   Lab Results  Component Value Date   WBC 7.5 04/26/2016   HGB 10.9 (L) 04/26/2016   HCT 35.1 (L) 04/26/2016   MCV 95.6 04/26/2016   PLT 214 04/26/2016   BMET    Component Value Date/Time   NA 139 04/25/2016 0436   K 3.5 04/25/2016 0436   CL 99 (L) 04/25/2016 0436   CO2 31 04/25/2016 0436   GLUCOSE 108 (H) 04/25/2016 0436   BUN 12 04/25/2016 0436   CREATININE 0.73 04/25/2016 0436   CALCIUM 8.6 (L) 04/25/2016 0436   GFRNONAA >60 04/25/2016 0436   GFRAA >60 04/25/2016 0436    Recent Results (from the past 240 hour(s))  Blood culture (routine x 2)     Status: None (Preliminary result)   Collection Time: 04/24/16  1:45 PM  Result Value Ref Range Status   Specimen Description BLOOD RIGHT HAND  Final   Special Requests BOTTLES DRAWN AEROBIC AND ANAEROBIC 5CC  Final   Culture NO GROWTH 2 DAYS  Final   Report Status PENDING  Incomplete  Blood culture (routine x 2)     Status: None (Preliminary result)   Collection Time: 04/24/16  2:15 PM  Result Value Ref Range Status   Specimen Description BLOOD RIGHT HAND  Final   Special Requests BOTTLES DRAWN AEROBIC AND ANAEROBIC 5CC  Final   Culture NO GROWTH 2 DAYS  Final   Report Status PENDING  Incomplete  Aerobic Culture (superficial specimen)     Status: None   Collection Time: 04/24/16  6:31 PM  Result Value Ref Range Status   Specimen Description LEG LEFT  Final   Special Requests Normal  Final   Gram Stain   Final   RARE WBC PRESENT, PREDOMINANTLY PMN RARE GRAM POSITIVE COCCI IN PAIRS    Culture   Final    MODERATE METHICILLIN RESISTANT STAPHYLOCOCCUS AUREUS   Report Status 04/26/2016 FINAL  Final   Organism ID, Bacteria METHICILLIN RESISTANT STAPHYLOCOCCUS AUREUS  Final      Susceptibility   Methicillin resistant staphylococcus aureus - MIC*    CIPROFLOXACIN >=8 RESISTANT Resistant     ERYTHROMYCIN >=8 RESISTANT Resistant     GENTAMICIN <=0.5 SENSITIVE Sensitive     OXACILLIN >=4 RESISTANT Resistant     TETRACYCLINE <=1 SENSITIVE Sensitive     VANCOMYCIN 1 SENSITIVE Sensitive     TRIMETH/SULFA <=10 SENSITIVE Sensitive     CLINDAMYCIN <=0.25 SENSITIVE Sensitive     RIFAMPIN <=0.5 SENSITIVE Sensitive     Inducible Clindamycin NEGATIVE Sensitive     * MODERATE METHICILLIN RESISTANT STAPHYLOCOCCUS AUREUS  Surgical pcr screen     Status: Abnormal   Collection Time: 04/24/16  9:05 PM  Result Value Ref Range Status   MRSA, PCR POSITIVE (A) NEGATIVE Final   Staphylococcus aureus POSITIVE (A) NEGATIVE Final    Comment:        The Xpert SA Assay (FDA approved  for NASAL specimens in patients over 60 years of age), is one component of a comprehensive surveillance program.  Test performance has been validated by [facility] for patients greater than or equal to 51 year old. It is not intended to diagnose infection nor to guide or monitor treatment. RESULT CALLED TO, READ BACK BY AND VERIFIED WITH: R LEIGHT,RN AT 2322 04/24/16 BY J FUDESCO   Aerobic/Anaerobic Culture (surgical/deep wound)     Status: None (Preliminary result)   Collection Time: 04/25/16  7:26 AM  Result Value Ref Range Status   Specimen Description ABSCESS LEFT LOWER LEG  Final   Special Requests NONE  Final   Gram Stain   Final    DEGENERATED CELLULAR MATERIAL PRESENT FEW GRAM POSITIVE COCCI IN PAIRS IN CLUSTERS RARE SQUAMOUS EPITHELIAL CELLS PRESENT    Culture   Final    MODERATE STAPHYLOCOCCUS AUREUS SUSCEPTIBILITIES TO  FOLLOW    Report Status PENDING  Incomplete      Assessment/Plan: 2 Days Post-Op   Principal Problem:   Left leg cellulitis Active Problems:   OSA (obstructive sleep apnea)   Varicose veins of bilateral lower extremities with other complications   HTN (hypertension)   Morbid obesity with BMI of 50.0-59.9, adult (HCC)   Transaminitis   Cellulitis and abscess of left leg   Cont IV abx Daily WC: pack with 1/4" nu-gauze; cover with ABDs and Ace wrap Cultures growing MRSA Dispo: D/C on 2 weeks of PO abx, ie zyvox, f/u with Dr. Veverly Fells in 1 week   [place], Horald Pollen 04/27/2016, 7:40 AM   Rod Can, MD Cell [phone]

## 2016-04-27 NOTE — Progress Notes (Signed)
Discharge instructions given. RN showed patient/ patient's wife how to do dressing change on the L ankle.  Pt verbalized understanding and all questions were answered.

## 2016-04-27 NOTE — Progress Notes (Signed)
Pharmacy Antibiotic Note Jeffery Simon is a 50 y.o. male admitted on 04/24/2016 with left medial malleolus cellulitis with draining abscess. Pharmacy has been consulted for vancomycin dosing. Pt is afebrile and WBC is WNL. Pt growing MRSA in wound culture but is improving on IV antibiotics.   Plan: Continue vancomycin 1500mg  IV Q12H F/u renal fxn, C&S, clinical status and trough at SS   Temp (24hrs), Avg:98.4 F (36.9 C), Min:98.2 F (36.8 C), Max:98.7 F (37.1 C)   Recent Labs Lab 04/24/16 1313 04/25/16 0436 04/26/16 0603 04/27/16 0721  WBC 9.9 7.8 7.5 8.9  CREATININE 0.71 0.73  --  0.67    Estimated Creatinine Clearance: 182.8 mL/min (by C-G formula based on SCr of 0.67 mg/dL).    No Known Allergies  Antimicrobials this admission: Vanc 3/31>> CTX 3/31 x 1  Thank you for allowing pharmacy to be a part of this patient's care.  Salome Arnt, PharmD, BCPS Pager # [phone] 04/27/2016 9:42 AM

## 2016-04-28 NOTE — Care Management (Signed)
Case manager called referral Freda Munro at  to [place], [phone], spoke with Freda Munro. Referral has been entered in [place]. Freda Munro will contact patient to confirm appointment time for 04/29/16.

## 2016-04-29 LAB — CULTURE, BLOOD (ROUTINE X 2)
Culture: NO GROWTH
Culture: NO GROWTH

## 2016-04-30 LAB — AEROBIC/ANAEROBIC CULTURE (SURGICAL/DEEP WOUND)

## 2016-04-30 LAB — AEROBIC/ANAEROBIC CULTURE W GRAM STAIN (SURGICAL/DEEP WOUND)

## 2016-05-04 ENCOUNTER — Encounter: Payer: BLUE CROSS/BLUE SHIELD | Attending: Internal Medicine | Admitting: Internal Medicine

## 2016-05-04 DIAGNOSIS — I10 Essential (primary) hypertension: Secondary | ICD-10-CM | POA: Insufficient documentation

## 2016-05-04 DIAGNOSIS — G4733 Obstructive sleep apnea (adult) (pediatric): Secondary | ICD-10-CM | POA: Insufficient documentation

## 2016-05-04 DIAGNOSIS — B9562 Methicillin resistant Staphylococcus aureus infection as the cause of diseases classified elsewhere: Secondary | ICD-10-CM | POA: Diagnosis not present

## 2016-05-04 DIAGNOSIS — L97323 Non-pressure chronic ulcer of left ankle with necrosis of muscle: Secondary | ICD-10-CM | POA: Diagnosis not present

## 2016-05-04 DIAGNOSIS — T8131XD Disruption of external operation (surgical) wound, not elsewhere classified, subsequent encounter: Secondary | ICD-10-CM | POA: Diagnosis not present

## 2016-05-04 DIAGNOSIS — Z6841 Body Mass Index (BMI) 40.0 and over, adult: Secondary | ICD-10-CM | POA: Diagnosis not present

## 2016-05-04 DIAGNOSIS — Y839 Surgical procedure, unspecified as the cause of abnormal reaction of the patient, or of later complication, without mention of misadventure at the time of the procedure: Secondary | ICD-10-CM | POA: Insufficient documentation

## 2016-05-04 DIAGNOSIS — I87332 Chronic venous hypertension (idiopathic) with ulcer and inflammation of left lower extremity: Secondary | ICD-10-CM | POA: Insufficient documentation

## 2016-05-04 DIAGNOSIS — I890 Lymphedema, not elsewhere classified: Secondary | ICD-10-CM | POA: Diagnosis not present

## 2016-05-04 DIAGNOSIS — T8131XA Disruption of external operation (surgical) wound, not elsewhere classified, initial encounter: Secondary | ICD-10-CM | POA: Diagnosis not present

## 2016-05-04 DIAGNOSIS — I872 Venous insufficiency (chronic) (peripheral): Secondary | ICD-10-CM | POA: Diagnosis not present

## 2016-05-05 NOTE — Progress Notes (Addendum)
TAMMY, ERICSSON ([id number]) Visit Report for 05/04/2016 Allergy List Details Patient Name: Jeffery Simon, Jeffery Simon. Date of Service: 05/04/2016 12:45 PM Medical Record Number: [id number] Patient Account Number: [id number] Date of Birth/Sex: Jul 13, 1965 (51 y.o. Male) Treating RN: Baruch Gouty, RN, BSN, Velva Harman Primary Care Njeri Vicente: Greig Right Other Clinician: Referring Seraphim Trow: HOBBS, PHILLIP Treating Darrold Bezek/Extender: Ricard Dillon Weeks in Treatment: 0 Allergies Active Allergies No Known Allergies Allergy Notes Electronic Signature(s) Signed: 05/04/2016 12:27:59 PM By: Regan Lemming BSN, RN Entered By: Regan Lemming on 05/04/2016 12:27:59 [name] ([id number]) -------------------------------------------------------------------------------- Arrival Information Details Patient Name: [name]. Date of Service: 05/04/2016 12:45 PM Medical Record Number: [id number] Patient Account Number: [id number] Date of Birth/Sex: September 29, 1965 (51 y.o. Male) Treating RN: Baruch Gouty, RN, BSN, Velva Harman Primary Care Demitrious Mccannon: Greig Right Other Clinician: Referring Khylie Larmore: HOBBS, PHILLIP Treating Clarence Dunsmore/Extender: Tito Dine in Treatment: 0 Visit Information Patient Arrived: Ambulatory Arrival Time: 12:43 Accompanied By: wife Transfer Assistance: None Patient Identification Verified: Yes Secondary Verification Process Yes Completed: Patient Requires Transmission-Based No Precautions: Patient Has Alerts: No Electronic Signature(s) Signed: 05/04/2016 5:31:29 PM By: Regan Lemming BSN, RN Entered By: Regan Lemming on 05/04/2016 12:50:05 [name] ([id number]) -------------------------------------------------------------------------------- Clinic Level of Care Assessment Details Patient Name: [name]. Date of Service: 05/04/2016 12:45 PM Medical Record Number: [id number] Patient Account Number: [id number] Date of Birth/Sex: [birth year] (51 y.o. Male) Treating RN:  Baruch Gouty, RN, BSN, Velva Harman Primary Care Mikhai Bienvenue: Greig Right Other Clinician: Referring Avaiyah Strubel: HOBBS, PHILLIP Treating Shayden Bobier/Extender: Ricard Dillon Weeks in Treatment: 0 Clinic Level of Care Assessment Items TOOL 2 Quantity Score []  - Use when only an EandM is performed on the INITIAL visit 0 ASSESSMENTS - Nursing Assessment / Reassessment X - General Physical Exam (combine w/ comprehensive assessment (listed just 1 20 below) when performed on new pt. evals) X - Comprehensive Assessment (HX, ROS, Risk Assessments, Wounds Hx, etc.) 1 25 ASSESSMENTS - Wound and Skin Assessment / Reassessment X - Simple Wound Assessment / Reassessment - one wound 1 5 []  - Complex Wound Assessment / Reassessment - multiple wounds 0 []  - Dermatologic / Skin Assessment (not related to wound area) 0 ASSESSMENTS - Ostomy and/or Continence Assessment and Care []  - Incontinence Assessment and Management 0 []  - Ostomy Care Assessment and Management (repouching, etc.) 0 PROCESS - Coordination of Care X - Simple Patient / Family Education for ongoing care 1 15 []  - Complex (extensive) Patient / Family Education for ongoing care 0 []  - Staff obtains Programmer, systems, Records, Test Results / Process Orders 0 []  - Staff telephones HHA, Nursing Homes / Clarify orders / etc 0 []  - Routine Transfer to another Facility (non-emergent condition) 0 []  - Routine Hospital Admission (non-emergent condition) 0 []  - New Admissions / Biomedical engineer / Ordering NPWT, Apligraf, etc. 0 []  - Emergency Hospital Admission (emergent condition) 0 []  - Simple Discharge Coordination 0 TAVIS, KRING. ([id number]) []  - Complex (extensive) Discharge Coordination 0 PROCESS - Special Needs []  - Pediatric / Minor Patient Management 0 []  - Isolation Patient Management 0 []  - Hearing / Language / Visual special needs 0 []  - Assessment of Community assistance (transportation, D/C planning, etc.) 0 []  - Additional assistance /  Altered mentation 0 []  - Support Surface(s) Assessment (bed, cushion, seat, etc.) 0 INTERVENTIONS - Wound Cleansing / Measurement X - Wound Imaging (photographs - any number of wounds) 1 5 []  - Wound Tracing (instead of photographs) 0 X - Simple Wound Measurement - one wound 1 5 []  -  Complex Wound Measurement - multiple wounds 0 X - Simple Wound Cleansing - one wound 1 5 []  - Complex Wound Cleansing - multiple wounds 0 INTERVENTIONS - Wound Dressings X - Small Wound Dressing one or multiple wounds 1 10 []  - Medium Wound Dressing one or multiple wounds 0 []  - Large Wound Dressing one or multiple wounds 0 []  - Application of Medications - injection 0 INTERVENTIONS - Miscellaneous []  - External ear exam 0 []  - Specimen Collection (cultures, biopsies, blood, body fluids, etc.) 0 []  - Specimen(s) / Culture(s) sent or taken to Lab for analysis 0 []  - Patient Transfer (multiple staff / Civil Service fast streamer / Similar devices) 0 []  - Simple Staple / Suture removal (25 or less) 0 []  - Complex Staple / Suture removal (26 or more) 0 Mathurin, Zeph M. ([id number]) []  - Hypo / Hyperglycemic Management (close monitor of Blood Glucose) 0 X - Ankle / Brachial Index (ABI) - do not check if billed separately 1 15 Has the patient been seen at the hospital within the last three years: Yes Total Score: 105 Level Of Care: New/Established - Level 3 Electronic Signature(s) Signed: 05/04/2016 5:31:29 PM By: Regan Lemming BSN, RN Entered By: Regan Lemming on 05/04/2016 13:24:11 [name] ([id number]) -------------------------------------------------------------------------------- Compression Therapy Details Patient Name: [name]. Date of Service: 05/04/2016 12:45 PM Medical Record Number: [id number] Patient Account Number: [id number] Date of Birth/Sex: [birth year] (51 y.o. Male) Treating RN: Baruch Gouty, RN, BSN, Velva Harman Primary Care Novi Calia: Greig Right Other Clinician: Referring Gregary Blackard: HOBBS,  PHILLIP Treating Zanyiah Posten/Extender: Ricard Dillon Weeks in Treatment: 0 Compression Therapy Performed for Wound Wound #1 Left,Medial Malleolus Assessment: Performed By: Clinician Baruch Gouty, RN, BSN, Velva Harman, RN Compression Type: Four Layer Pre Treatment ABI: 0.9 Post Procedure Diagnosis Same as Pre-procedure Electronic Signature(s) Signed: 05/04/2016 5:31:29 PM By: Regan Lemming BSN, RN Entered By: Regan Lemming on 05/04/2016 13:25:31 [name] ([id number]) -------------------------------------------------------------------------------- Encounter Discharge Information Details Patient Name: [name]. Date of Service: 05/04/2016 12:45 PM Medical Record Number: [id number] Patient Account Number: [id number] Date of Birth/Sex: [birth year] (51 y.o. Male) Treating RN: Baruch Gouty, RN, BSN, Velva Harman Primary Care Rowynn Mcweeney: Greig Right Other Clinician: Referring Eusevio Schriver: HOBBS, PHILLIP Treating Reghan Thul/Extender: Tito Dine in Treatment: 0 Encounter Discharge Information Items Schedule Follow-up Appointment: No Medication Reconciliation completed No and provided to Patient/Care Concepcion Kirkpatrick: Provided on Clinical Summary of Care: 05/04/2016 Form Type Recipient Paper Patient HL Electronic Signature(s) Signed: 05/04/2016 1:48:16 PM By: Ruthine Dose Entered By: Ruthine Dose on 05/04/2016 13:48:16 [name] ([id number]) -------------------------------------------------------------------------------- Lower Extremity Assessment Details Patient Name: [name]. Date of Service: 05/04/2016 12:45 PM Medical Record Number: [id number] Patient Account Number: [id number] Date of Birth/Sex: Apr 20, 1965 (51 y.o. Male) Treating RN: Afful, RN, BSN, [place] Primary Care Madelene Kaatz: Greig Right Other Clinician: Referring Vihana Kydd: HOBBS, PHILLIP Treating Harveen Flesch/Extender: Ricard Dillon Weeks in Treatment: 0 Edema Assessment Assessed: [Left: No] [Right: No] E[Left: dema]  [Right: :] Calf Left: Right: Point of Measurement: 38 cm From Medial Instep 56 cm 52 cm Ankle Left: Right: Point of Measurement: 12 cm From Medial Instep 31.5 cm 31.5 cm Vascular Assessment Claudication: Claudication Assessment [Left:None] [Right:None] Pulses: Dorsalis Pedis Palpable: [Left:Yes] [Right:Yes] Posterior Tibial Palpable: [Left:Yes] [Right:Yes] Extremity colors, hair growth, and conditions: Extremity Color: [Left:Mottled] [Right:Mottled] Hair Growth on Extremity: [Left:Yes] [Right:Yes] Temperature of Extremity: [Left:Warm] [Right:Warm] Capillary Refill: [Left:< 3 seconds] [Right:< 3 seconds] Blood Pressure: Brachial: [Right:174] Dorsalis Pedis: 160 [Left:Dorsalis Pedis: 180] Ankle: Posterior Tibial: [Left:Posterior Tibial: 0.92] [Right:1.03] Toe Nail Assessment Left:  Right: Thick: Yes Yes Discolored: Yes Yes Deformed: No No Improper Length and Hygiene: No No KACY, CONELY ([id number]) Electronic Signature(s) Signed: 05/04/2016 5:31:29 PM By: Regan Lemming BSN, RN Entered By: Regan Lemming on 05/04/2016 13:10:26 [name] ([id number]) -------------------------------------------------------------------------------- Multi Wound Chart Details Patient Name: [name]. Date of Service: 05/04/2016 12:45 PM Medical Record Number: [id number] Patient Account Number: [id number] Date of Birth/Sex: Nov 13, 1965 (51 y.o. Male) Treating RN: Baruch Gouty, RN, BSN, Velva Harman Primary Care Thresa Dozier: Greig Right Other Clinician: Referring Lorieann Argueta: HOBBS, PHILLIP Treating Aspen Lawrance/Extender: Ricard Dillon Weeks in Treatment: 0 Photos: [1:No Photos] [N/A:N/A] Wound Location: [1:Left Malleolus - Medial] [N/A:N/A] Wounding Event: [1:Gradually Appeared] [N/A:N/A] Primary Etiology: [1:Dehisced Wound] [N/A:N/A] Comorbid History: [1:Sleep Apnea, Hypertension] [N/A:N/A] Date Acquired: [1:04/25/2016] [N/A:N/A] Weeks of Treatment: [1:0] [N/A:N/A] Wound Status: [1:Open]  [N/A:N/A] Measurements L x W x D 1x0.6x1.2 [N/A:N/A] (cm) Area (cm) : [1:0.471] [N/A:N/A] Volume (cm) : [1:0.565] [N/A:N/A] Classification: [1:Full Thickness Without Exposed Support Structures] [N/A:N/A] Exudate Amount: [1:Medium] [N/A:N/A] Exudate Type: [1:Serosanguineous] [N/A:N/A] Exudate Color: [1:red, brown] [N/A:N/A] Wound Margin: [1:Distinct, outline attached] [N/A:N/A] Granulation Amount: [1:Large (67-100%)] [N/A:N/A] Granulation Quality: [1:Pink] [N/A:N/A] Necrotic Amount: [1:None Present (0%)] [N/A:N/A] Exposed Structures: [1:Fascia: No Fat Layer (Subcutaneous Tissue) Exposed: No Tendon: No Muscle: No Joint: No Bone: No Limited to Skin Breakdown] [N/A:N/A] Epithelialization: [1:None] [N/A:N/A] Periwound Skin Texture: Excoriation: No [1:Induration: No Callus: No Crepitus: No] [N/A:N/A] Rash: No Scarring: No Periwound Skin Maceration: No N/A N/A Moisture: Dry/Scaly: No Periwound Skin Color: Erythema: Yes N/A N/A Mottled: Yes Atrophie Blanche: No Cyanosis: No Ecchymosis: No Hemosiderin Staining: No Pallor: No Rubor: No Erythema Location: Circumferential N/A N/A Temperature: No Abnormality N/A N/A Tenderness on Yes N/A N/A Palpation: Wound Preparation: Ulcer Cleansing: N/A N/A Rinsed/Irrigated with Saline Topical Anesthetic Applied: Other: lidocaine 4% Procedures Performed: Compression Therapy N/A N/A Treatment Notes Electronic Signature(s) Signed: 05/04/2016 5:28:32 PM By: Linton Ham MD Entered By: Linton Ham on 05/04/2016 14:00:57 [name] ([id number]) -------------------------------------------------------------------------------- [place] Details Patient Name: JAESEAN, LITZAU. Date of Service: 05/04/2016 12:45 PM Medical Record Number: [id number] Patient Account Number: [id number] Date of Birth/Sex: Sep 17, 1965 (51 y.o. Male) Treating RN: Baruch Gouty, RN, BSN, Velva Harman Primary Care Avril Busser: Greig Right Other  Clinician: Referring Azad Calame: HOBBS, PHILLIP Treating Daizy Outen/Extender: Tito Dine in Treatment: 0 Active Inactive ` Orientation to the Wound Care Program Nursing Diagnoses: Knowledge deficit related to the wound healing center program Goals: Patient/caregiver will verbalize understanding of the [place] Program Date Initiated: 05/04/2016 Target Resolution Date: 09/03/2016 Goal Status: Active Interventions: Provide education on orientation to the wound center Notes: ` Venous Leg Ulcer Nursing Diagnoses: Actual venous Insuffiency (use after diagnosis is confirmed) Potential for venous Insuffiency (use before diagnosis confirmed) Goals: Non-invasive venous studies are completed as ordered Date Initiated: 05/04/2016 Target Resolution Date: 09/03/2016 Goal Status: Active Patient will maintain optimal edema control Date Initiated: 05/04/2016 Target Resolution Date: 09/03/2016 Goal Status: Active Patient/caregiver will verbalize understanding of disease process and disease management Date Initiated: 05/04/2016 Target Resolution Date: 09/03/2016 Goal Status: Active Verify adequate tissue perfusion prior to therapeutic compression application Date Initiated: 05/04/2016 Target Resolution Date: 09/03/2016 Goal Status: Active ELLIE, SPICKLER. ([id number]) Interventions: Assess peripheral edema status every visit. Compression as ordered Provide education on venous insufficiency Treatment Activities: Therapeutic compression applied : 05/04/2016 Notes: ` Wound/Skin Impairment Nursing Diagnoses: Impaired tissue integrity Knowledge deficit related to ulceration/compromised skin integrity Goals: Patient/caregiver will verbalize understanding of skin care regimen Date Initiated: 05/04/2016 Target Resolution Date: 09/03/2016 Goal Status: Active  Ulcer/skin breakdown will have a volume reduction of 30% by week 4 Date Initiated: 05/04/2016 Target Resolution Date:  09/03/2016 Goal Status: Active Ulcer/skin breakdown will have a volume reduction of 50% by week 8 Date Initiated: 05/04/2016 Target Resolution Date: 09/03/2016 Goal Status: Active Ulcer/skin breakdown will have a volume reduction of 80% by week 12 Date Initiated: 05/04/2016 Target Resolution Date: 09/03/2016 Goal Status: Active Ulcer/skin breakdown will heal within 14 weeks Date Initiated: 05/04/2016 Target Resolution Date: 09/03/2016 Goal Status: Active Interventions: Assess patient/caregiver ability to obtain necessary supplies Assess patient/caregiver ability to perform ulcer/skin care regimen upon admission and as needed Assess ulceration(s) every visit Provide education on ulcer and skin care Treatment Activities: Referred to DME Addalie Calles for dressing supplies : 05/04/2016 Skin care regimen initiated : 05/04/2016 Topical wound management initiated : 05/04/2016 ALTARIQ, GOODALL ([id number]) Notes: Electronic Signature(s) Signed: 05/04/2016 1:19:26 PM By: Regan Lemming BSN, RN Entered By: Regan Lemming on 05/04/2016 13:19:26 [name] ([id number]) -------------------------------------------------------------------------------- Pain Assessment Details Patient Name: [name]. Date of Service: 05/04/2016 12:45 PM Medical Record Number: [id number] Patient Account Number: [id number] Date of Birth/Sex: 1965/11/24 (51 y.o. Male) Treating RN: Baruch Gouty, RN, BSN, Velva Harman Primary Care Armas Mcbee: Greig Right Other Clinician: Referring Virgilia Quigg: HOBBS, PHILLIP Treating Isiaih Hollenbach/Extender: Ricard Dillon Weeks in Treatment: 0 Active Problems Location of Pain Severity and Description of Pain Patient Has Paino No Site Locations With Dressing Change: No Pain Management and Medication Current Pain Management: Electronic Signature(s) Signed: 05/04/2016 5:31:29 PM By: Regan Lemming BSN, RN Entered By: Regan Lemming on 05/04/2016 12:50:14 [name]  ([id number]) -------------------------------------------------------------------------------- Wound Assessment Details Patient Name: [name]. Date of Service: 05/04/2016 12:45 PM Medical Record Number: [id number] Patient Account Number: [id number] Date of Birth/Sex: 1965-11-16 (51 y.o. Male) Treating RN: Baruch Gouty, RN, BSN, [place] Primary Care Kebron Pulse: Greig Right Other Clinician: Referring Helane Briceno: HOBBS, PHILLIP Treating Sonya Gunnoe/Extender: Ricard Dillon Weeks in Treatment: 0 Wound Status Wound Number: 1 Primary Etiology: Dehisced Wound Wound Location: Left Malleolus - Medial Wound Status: Open Wounding Event: Gradually Appeared Comorbid History: Sleep Apnea, Hypertension Date Acquired: 04/25/2016 Weeks Of Treatment: 0 Clustered Wound: No Photos Photo Uploaded By: Regan Lemming on 05/04/2016 17:30:01 Wound Measurements Length: (cm) 1 Width: (cm) 0.6 Depth: (cm) 1.2 Area: (cm) 0.471 Volume: (cm) 0.565 % Reduction in Area: % Reduction in Volume: Epithelialization: None Tunneling: No Undermining: No Wound Description Full Thickness Without Exposed Classification: Support Structures Wound Margin: Distinct, outline attached Exudate Medium Amount: Exudate Type: Serosanguineous Exudate Color: red, brown Foul Odor After Cleansing: No Slough/Fibrino No Wound Bed Granulation Amount: Large (67-100%) Exposed Structure Granulation Quality: Pink Fascia Exposed: No Necrotic Amount: None Present (0%) Fat Layer (Subcutaneous Tissue) Exposed: No Pal, Jaekwon M. ([id number]) Tendon Exposed: No Muscle Exposed: No Joint Exposed: No Bone Exposed: No Limited to Skin Breakdown Periwound Skin Texture Texture Color No Abnormalities Noted: No No Abnormalities Noted: No Callus: No Atrophie Blanche: No Crepitus: No Cyanosis: No Excoriation: No Ecchymosis: No Induration: No Erythema: Yes Rash: No Erythema Location: Circumferential Scarring: No Hemosiderin  Staining: No Mottled: Yes Moisture Pallor: No No Abnormalities Noted: No Rubor: No Dry / Scaly: No Maceration: No Temperature / Pain Temperature: No Abnormality Tenderness on Palpation: Yes Wound Preparation Ulcer Cleansing: Rinsed/Irrigated with Saline Topical Anesthetic Applied: Other: lidocaine 4%, Electronic Signature(s) Signed: 05/04/2016 5:31:29 PM By: Regan Lemming BSN, RN Entered By: Regan Lemming on 05/04/2016 13:02:10 [name] ([id number]) -------------------------------------------------------------------------------- Vitals Details Patient Name: [name]. Date of Service: 05/04/2016 12:45 PM Medical Record  Number: [id number] Patient Account Number: [id number] Date of Birth/Sex: 02-Sep-1965 (51 y.o. Male) Treating RN: Baruch Gouty, RN, BSN, Velva Harman Primary Care Oseph Imburgia: Greig Right Other Clinician: Referring Amyiah Gaba: HOBBS, PHILLIP Treating Brance Dartt/Extender: Ricard Dillon Weeks in Treatment: 0 Vital Signs Time Taken: 12:45 Reference Range: 80 - 120 mg / dl Height (in): 71 Source: Stated Weight (lbs): 389 Source: Stated Body Mass Index (BMI): 54.2 Electronic Signature(s) Signed: 05/04/2016 12:27:32 PM By: Regan Lemming BSN, RN Entered By: Regan Lemming on 05/04/2016 12:27:32

## 2016-05-06 NOTE — Progress Notes (Signed)
ERIVERTO, BYRNES ([id number]) Visit Report for 05/04/2016 Chief Complaint Document Details Patient Name: Jeffery Simon, Jeffery Simon. Date of Service: 05/04/2016 12:45 PM Medical Record Number: [id number] Patient Account Number: [id number] Date of Birth/Sex: 15-Sep-1965 (51 y.o. Male) Treating RN: Baruch Gouty, RN, BSN, Velva Harman Primary Care Provider: Greig Right Other Clinician: Referring Provider: HOBBS, PHILLIP Treating Provider/Extender: Ricard Dillon Weeks in Treatment: 0 Information Obtained from: Patient Chief Complaint 05/04/16; Patient is here for review of a post surgical wound on the left medial ankle Electronic Signature(s) Signed: 05/04/2016 5:28:32 PM By: Linton Ham MD Entered By: Linton Ham on 05/04/2016 14:02:18 Jeffery Simon ([id number]) -------------------------------------------------------------------------------- HPI Details Patient Name: Jeffery Simon. Date of Service: 05/04/2016 12:45 PM Medical Record Number: [id number] Patient Account Number: [id number] Date of Birth/Sex: Jun 08, 1965 (51 y.o. Male) Treating RN: Baruch Gouty, RN, BSN, Velva Harman Primary Care Provider: Greig Right Other Clinician: Referring Provider: HOBBS, PHILLIP Treating Provider/Extender: Ricard Dillon Weeks in Treatment: 0 History of Present Illness HPI Description: 05/04/16; this is a 51 year old nondiabetic non-smoking man who is here for review of a wound on the left medial ankle. He has severe hypertension, severe obstructive sleep apnea on CPAP and morbid obesity. Is a problem apparently began in December with small veins that ruptured causing copious amounts of bleeding. He followed with Dr. [name] of [facility] underwent laser ablation in the left leg in January and on the right leg later on. He tolerated both of these procedures well. However he was admitted from 04/24/16 through 04/27/16 with cellulitis involving the left ankle. CT scan showed circumferential severe edema with posterior medial  ankle fluid collection. He was given vancomycin and Rocephin and was seen by Dr. [name] and underwent an IandD of the area. Aspiration of the left ankle was negative. Culture showed MRSA and he was discharged on doxycycline which she is still taking and will remain on total next week. He was discharged with iodoform packing to the surgical wound and a Ace wrap that is being applied by his wife. He was treated with vancomycin in the hospital. The patient does not have a prior history of lower extremity wounds although he has chronic venous insufficiency with inflammation and secondary lymphedema. Echocardiogram done in the hospital showed an EF of 50-55%. Grade 2 diastolic dysfunction ABIs in this clinic were 1.03 on the right 0.9 on the left Electronic Signature(s) Signed: 05/04/2016 5:28:32 PM By: Linton Ham MD Entered By: Linton Ham on 05/04/2016 15:41:15 Jeffery Simon ([id number]) -------------------------------------------------------------------------------- Physical Exam Details Patient Name: Jeffery Simon. Date of Service: 05/04/2016 12:45 PM Medical Record Number: [id number] Patient Account Number: [id number] Date of Birth/Sex: 11/21/1965 (51 y.o. Male) Treating RN: Baruch Gouty, RN, BSN, [place] Primary Care Provider: Greig Right Other Clinician: Referring Provider: HOBBS, PHILLIP Treating Provider/Extender: Ricard Dillon Weeks in Treatment: 0 Constitutional Patient is no distress. Morbid obesity by definition. Eyes Conjunctivae clear. No discharge.Marland Kitchen Respiratory Respiratory effort is easy and symmetric bilaterally. Rate is normal at rest and on room air.. Bilateral breath sounds are clear and equal in all lobes with no wheezes, rales or rhonchi.. Cardiovascular Heart sounds are distant however there are no murmurs JVP is not elevated. Pedal pulses palpable and strong bilaterally.. Edema present in both extremities. There is chronic venous insufficiency  insufficiency with venous inflammation and likely secondary lymphedema left greater than right. Gastrointestinal (GI) Abdomen is soft and non-distended without masses or tenderness. Bowel sounds active in all quadrants.. No liver or spleen enlargement or tenderness.. Lymphatic None palpable in the popliteal or  inguinal area. Musculoskeletal There did not appear to be any left ankle swelling or tenderness or limitation. Psychiatric No evidence of depression, anxiety, or agitation. Calm, cooperative, and communicative. Appropriate interactions and affect.. Notes Wound exam; the patient has a surgical open area on the medial aspect of his left ankle. This has some depth but no surrounding erythema no tenderness no purulence. There was no need for debridement or culturin the base of the wound appears to be healthy Electronic Signature(s) Signed: 05/04/2016 5:28:32 PM By: Linton Ham MD Entered By: Linton Ham on 05/04/2016 15:44:15 Jeffery Simon ([id number]) -------------------------------------------------------------------------------- Physician Orders Details Patient Name: Jeffery Simon. Date of Service: 05/04/2016 12:45 PM Medical Record Number: [id number] Patient Account Number: [id number] Date of Birth/Sex: Jan 16, 1966 (51 y.o. Male) Treating RN: Baruch Gouty, RN, BSN, Velva Harman Primary Care Provider: Greig Right Other Clinician: Referring Provider: HOBBS, PHILLIP Treating Provider/Extender: Tito Dine in Treatment: 0 Verbal / Phone Orders: No Diagnosis Coding Wound Cleansing Wound #1 Left,Medial Malleolus o Cleanse wound with mild soap and water o May Shower, gently pat wound dry prior to applying new dressing. Anesthetic Wound #1 Left,Medial Malleolus o Topical Lidocaine 4% cream applied to wound bed prior to debridement Skin Barriers/Peri-Wound Care Wound #1 Left,Medial Malleolus o Triamcinolone Acetonide Ointment Primary Wound Dressing Wound  #1 Left,Medial Malleolus o Aquacel Ag Secondary Dressing o ABD pad Dressing Change Frequency Wound #1 Left,Medial Malleolus o Change dressing every week Follow-up Appointments Wound #1 Left,Medial Malleolus o Return Appointment in 1 week. Edema Control Wound #1 Left,Medial Malleolus o 4-Layer Compression System - Left Lower Extremity o Elevate legs to the level of the heart and pump ankles as often as possible Additional Orders / Instructions Wound #1 Left,Medial Malleolus Jeffery Simon, Jeffery Simon. ([id number]) o Increase protein intake. o Activity as tolerated Electronic Signature(s) Signed: 05/04/2016 5:28:32 PM By: Linton Ham MD Signed: 05/04/2016 5:31:29 PM By: Regan Lemming BSN, RN Entered By: Regan Lemming on 05/04/2016 13:31:51 Jeffery Simon ([id number]) -------------------------------------------------------------------------------- Problem List Details Patient Name: Jeffery Simon, Jeffery Simon. Date of Service: 05/04/2016 12:45 PM Medical Record Number: [id number] Patient Account Number: [id number] Date of Birth/Sex: 09-Oct-1965 (51 y.o. 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 Date of Service: 05/04/2016 12:45 PM Medical Record Number: [id number] Patient Account Number: [id number] Date of Birth/Sex: [birth year] (51 y.o. Male) Treating RN: Baruch Gouty, RN, BSN, Velva Harman Primary Care Provider: Greig Right Other Clinician: Referring Provider: HOBBS, PHILLIP Treating Provider/Extender: Ricard Dillon Weeks in Treatment: 0 Subjective Chief Complaint Information obtained from Patient 05/04/16; Patient is here for review of a post surgical wound on the left medial ankle History of Present Illness (HPI) 05/04/16; this is a 51 year old nondiabetic non-smoking man who is here for review of a wound on the left medial ankle. He has severe hypertension, severe obstructive sleep apnea on CPAP and morbid obesity. Is a problem apparently began in December with small veins that  ruptured causing copious amounts of bleeding. He followed with Dr. [name] of [facility] underwent laser ablation in the left leg in January and on the right leg later on. He tolerated both of these procedures well. However he was admitted from 04/24/16 through 04/27/16 with cellulitis involving the left ankle. CT scan showed circumferential severe edema with posterior medial ankle fluid collection. He was given vancomycin and Rocephin and was seen by Dr. [name] and underwent an IandD of the area. Aspiration of the left ankle was negative. Culture showed MRSA and he was discharged on doxycycline which she is still taking and will remain on total next week. He was discharged with iodoform packing to the surgical wound and a Ace wrap that is being applied by his wife. He was treated with vancomycin in the hospital. The patient does not have a prior history of lower extremity wounds although he has chronic venous insufficiency with inflammation and secondary lymphedema.  Echocardiogram done in the hospital showed an EF of 50-55%. Grade 2 diastolic dysfunction ABIs in this clinic were 1.03 on the right 0.9 on the left Wound History Patient presents with 1 open wound that has been present for approximately 65month. Patient has been treating wound in the following manner: packing. Laboratory tests have not been performed in the last month. Patient reportedly has not tested positive for an antibiotic resistant organism. Patient reportedly has not tested positive for osteomyelitis. Patient reportedly has not had testing performed to evaluate circulation in the legs. Patient experiences the following problems associated with their wounds: swelling. Patient History Allergies No Known Allergies Family History Diabetes - Siblings, Hypertension - Father, Mother, Jeffery Simon, Jeffery Simon ([id number]) No family history of Cancer, Heart Disease, Hereditary Spherocytosis, Kidney Disease, Lung Disease, Seizures, Stroke, Thyroid Problems, Tuberculosis. Social History Never smoker, Marital Status - Married, Alcohol Use - Never, Drug Use - No History, Caffeine Use - Rarely. Medical History Eyes Denies history of Cataracts, Glaucoma, Optic Neuritis Hematologic/Lymphatic Denies history of Anemia, Hemophilia, Human Immunodeficiency Virus, Lymphedema, Sickle Cell Disease Respiratory Patient has history of Sleep Apnea - CPAP Denies history of Aspiration, Chronic Obstructive Pulmonary Disease (COPD), Pneumothorax Cardiovascular Patient has history of Hypertension Gastrointestinal Denies history of Cirrhosis , Colitis, Crohn s, Hepatitis A, Hepatitis B, Hepatitis C Endocrine Denies history of Type I Diabetes, Type II Diabetes Genitourinary Denies history of End Stage Renal Disease Immunological Denies history of Lupus Erythematosus, Raynaud s, Scleroderma Integumentary (Skin) Denies history of History of Burn, History of pressure wounds Musculoskeletal Denies history of Gout,  Rheumatoid Arthritis, Osteoarthritis, Osteomyelitis Neurologic Denies history of Dementia, Neuropathy, Quadriplegia, Paraplegia, Seizure Disorder Oncologic Denies history of Received Chemotherapy, Received Radiation Psychiatric Denies history of Anorexia/bulimia, Confinement Anxiety Medical And Surgical History Notes Cardiovascular Venous Insufficency Review of Systems (ROS) Constitutional Symptoms (General Health) The patient has no complaints or symptoms. Eyes Complains or has symptoms of Vision Changes, Glasses / Contacts. Ear/Nose/Mouth/Throat The patient has no complaints or symptoms. Hematologic/Lymphatic The patient has no complaints or symptoms. Respiratory The patient has no complaints or symptoms. Cardiovascular Jeffery Simon, Jeffery Simon ([id number]) The patient has no complaints or symptoms. Gastrointestinal The patient has no complaints or symptoms. Endocrine The patient has no complaints or symptoms. Genitourinary The patient has no complaints or symptoms. Immunological The patient has no complaints or symptoms. Integumentary (Skin) Complains or has symptoms of Wounds, Breakdown, Swelling. Musculoskeletal The patient has no complaints or symptoms. Neurologic The patient has no complaints or symptoms. Oncologic The patient has no complaints or symptoms. Psychiatric The patient  has no complaints or symptoms. Objective Constitutional Patient is no distress. Morbid obesity by definition. Vitals Time Taken: 12:45 PM, Height: 71 in, Source: Stated, Weight: 389 lbs, Source: Stated, BMI: 54.2. Eyes Conjunctivae clear. No discharge.Marland Kitchen Respiratory Respiratory effort is easy and symmetric bilaterally. Rate is normal at rest and on room air.. Bilateral breath sounds are clear and equal in all lobes with no wheezes, rales or rhonchi.. Cardiovascular Heart sounds are distant however there are no murmurs JVP is not elevated. Pedal pulses palpable and strong bilaterally..  Edema present in both extremities. There is chronic venous insufficiency insufficiency with venous inflammation and likely secondary lymphedema left greater than right. Gastrointestinal (GI) Abdomen is soft and non-distended without masses or tenderness. Bowel sounds active in all quadrants.. No liver or spleen enlargement or tenderness.. Lymphatic Jeffery Simon, Jeffery Simon. ([id number]) None palpable in the popliteal or inguinal area. Musculoskeletal There did not appear to be any left ankle swelling or tenderness or limitation. Psychiatric No evidence of depression, anxiety, or agitation. Calm, cooperative, and communicative. Appropriate interactions and affect.. General Notes: Wound exam; the patient has a surgical open area on the medial aspect of his left ankle. This has some depth but no surrounding erythema no tenderness no purulence. There was no need for debridement or culturin the base of the wound appears to be healthy Integumentary (Hair, Skin) Wound #1 status is Open. Original cause of wound was Gradually Appeared. The wound is located on the Left,Medial Malleolus. The wound measures 1cm length x 0.6cm width x 1.2cm depth; 0.471cm^2 area and 0.565cm^3 volume. The wound is limited to skin breakdown. There is no tunneling or undermining noted. There is a medium amount of serosanguineous drainage noted. The wound margin is distinct with the outline attached to the wound base. There is large (67-100%) pink granulation within the wound bed. There is no necrotic tissue within the wound bed. The periwound skin appearance exhibited: Mottled, Erythema. The periwound skin appearance did not exhibit: Callus, Crepitus, Excoriation, Induration, Rash, Scarring, Dry/Scaly, Maceration, Atrophie Blanche, Cyanosis, Ecchymosis, Hemosiderin Staining, Pallor, Rubor. The surrounding wound skin color is noted with erythema which is circumferential. Periwound temperature was noted as No Abnormality. The  periwound has tenderness on palpation. 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 There was a Four Layer Compression Therapy Procedure with a pre-treatment ABI of 0.9 by Afful, RN, BSN, Velva Harman, Therapist, sports. Post procedure Diagnosis Wound #1: Same as Pre-Procedure Jeffery Simon, Jeffery Simon ([id number]) Plan Wound Cleansing: Wound #1 Left,Medial Malleolus: Cleanse wound with mild soap and water May Shower, gently pat wound dry prior to applying new dressing. Anesthetic: Wound #1 Left,Medial Malleolus: Topical Lidocaine 4% cream applied to wound bed prior to debridement Skin Barriers/Peri-Wound Care: Wound #1 Left,Medial Malleolus: Triamcinolone Acetonide Ointment Primary Wound Dressing: Wound #1 Left,Medial Malleolus: Aquacel Ag Secondary Dressing: ABD pad Dressing Change Frequency: Wound #1 Left,Medial Malleolus: Change dressing every week Follow-up Appointments: Wound #1 Left,Medial Malleolus: Return Appointment in 1 week. Edema Control: Wound #1 Left,Medial Malleolus: 4-Layer Compression System - Left Lower Extremity Elevate legs to the level of the heart and pump ankles as often as possible Additional Orders / Instructions: Wound #1 Left,Medial Malleolus: Increase protein intake. Activity as tolerated #1 we have  changed from iodoform to silver alginate packing, ABDs #2 TCA to the chronic venous inflammation on the left leg #3 I see no reason why he can't tolerate 4-layer compression Jeffery Simon, Jeffery Simon ([id number]) #4 he is to continue the doxycycline previously prescribed, I saw no need for additional antibiotics here  or additional cultures #5 the patient had an aspiration of the left ankle done in the hospital that did not show significant fluid. His CT scans of the ankle did not comment on significant chart take he'll her pathology Electronic Signature(s) Signed: 05/04/2016 5:28:32 PM By: Linton Ham MD Entered By: Linton Ham on 05/04/2016 15:46:29 Jeffery Simon ([id number]) -------------------------------------------------------------------------------- ROS/PFSH Details Patient Name: Jeffery Simon. Date of Service: 05/04/2016 12:45 PM Medical Record Number: [id number] Patient Account Number: [id number] Date of Birth/Sex: 01-22-1966 (51 y.o. Male) Treating RN: Baruch Gouty, RN, BSN, [place] Primary Care Provider: Greig Right Other Clinician: Referring Provider: HOBBS, PHILLIP Treating Provider/Extender: Ricard Dillon Weeks in Treatment: 0 Wound History Do you currently have one or more open woundso Yes How many open wounds do you currently haveo 1 Approximately how long have you had your woundso 62month How have you been treating your wound(s) until nowo packing Has your wound(s) ever healed and then re-openedo No Have you had any lab work done in the past montho No Have you tested positive for an antibiotic resistant organism (MRSA, VRE)o No Have you tested positive for osteomyelitis (bone infection)o No Have you had any tests for circulation on your legso No Have you had other problems associated with your woundso Swelling Eyes Complaints and Symptoms: Positive for: Vision Changes; Glasses / Contacts Medical History: Negative for: Cataracts; Glaucoma; Optic Neuritis Integumentary (Skin) Complaints and Symptoms: Positive for: Wounds; Breakdown; Swelling Medical History: Negative for: History of Burn; History of pressure wounds Constitutional Symptoms (General Health) Complaints and Symptoms: No Complaints or Symptoms Ear/Nose/Mouth/Throat Complaints and Symptoms: No  Complaints or Symptoms Hematologic/Lymphatic Complaints and Symptoms: No Complaints or Symptoms Jeffery Simon, Jeffery Simon ([id number]) Medical History: Negative for: Anemia; Hemophilia; Human Immunodeficiency Virus; Lymphedema; Sickle Cell Disease Respiratory Complaints and Symptoms: No Complaints or Symptoms Medical History: Positive for: Sleep Apnea - CPAP Negative for: Aspiration; Chronic Obstructive Pulmonary Disease (COPD); Pneumothorax Cardiovascular Complaints and Symptoms: No Complaints or Symptoms Medical History: Positive for: Hypertension Past Medical History Notes: Venous Insufficency Gastrointestinal Complaints and Symptoms: No Complaints or Symptoms Medical History: Negative for: Cirrhosis ; Colitis; Crohnos; Hepatitis A; Hepatitis B; Hepatitis C Endocrine Complaints and Symptoms: No Complaints or Symptoms Medical History: Negative for: Type I Diabetes; Type II Diabetes Genitourinary Complaints and Symptoms: No Complaints or Symptoms Medical History: Negative for: End Stage Renal Disease Immunological Complaints and Symptoms: No Complaints or Symptoms Medical HistoryJAMARII, Jeffery Simon ([id number]) Negative for: Lupus Erythematosus; Raynaudos; Scleroderma Musculoskeletal Complaints and Symptoms: No Complaints or Symptoms Medical History: Negative for: Gout; Rheumatoid Arthritis; Osteoarthritis; Osteomyelitis Neurologic Complaints and Symptoms: No Complaints or Symptoms Medical History: Negative for: Dementia; Neuropathy; Quadriplegia; Paraplegia; Seizure Disorder Oncologic Complaints and Symptoms: No Complaints or Symptoms Medical History: Negative for: Received Chemotherapy; Received Radiation Psychiatric Complaints and Symptoms: No Complaints or Symptoms Medical History: Negative for: Anorexia/bulimia; Confinement Anxiety Immunizations Pneumococcal Vaccine: Received Pneumococcal Vaccination: No Family and Social History Cancer: No; Diabetes:  Yes - Siblings; Heart Disease: No; Hereditary Spherocytosis: No; Hypertension: Yes - Father, Mother; Kidney Disease: No; Lung Disease: No; Seizures: No; Stroke: No; Thyroid Problems: No; Tuberculosis: No; Never smoker; Marital Status - Married; Alcohol Use: Never; Drug Use: No History; Caffeine Use:  Rarely; Financial Concerns: No; Food, Clothing or Shelter Needs: No; Support System Lacking: No; Transportation Concerns: No; Advanced Directives: No; Living Will: No Electronic Signature(s) Signed: 05/04/2016 5:28:32 PM By: Linton Ham MD Signed: 05/04/2016 5:31:29 PM By: Regan Lemming BSN, RN Entered By: Regan Lemming on 05/04/2016 12:49:43 Jeffery Simon ([id number] Simon ([id number]) -------------------------------------------------------------------------------- SuperBill Details Patient Name: OSAMA, COLESON. Date of Service: 05/04/2016 Medical Record Number: [id number] Patient Account Number: [id number] Date of Birth/Sex: 06-19-1965 (51 y.o. 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

## 2016-05-06 NOTE — Progress Notes (Signed)
Jeffery Simon ([id number]) Visit Report for 05/04/2016 Abuse/Suicide Risk Screen Details Patient Name: Jeffery Simon, Jeffery Simon. Date of Service: 05/04/2016 12:45 PM Medical Record Number: [id number] Patient Account Number: [id number] Date of Birth/Sex: [birth year] (51 y.o. Male) Treating RN: Baruch Gouty, RN, BSN, [place] [facility] Korynn Kenedy: Greig Right Other Clinician: Referring Jonnie Truxillo: HOBBS, PHILLIP Treating Jullie Arps/Extender: Ricard Dillon Weeks in Treatment: 0 Abuse/Suicide Risk Screen Items Answer ABUSE/SUICIDE RISK SCREEN: Has anyone close to you tried to hurt or harm you recentlyo No Do you feel uncomfortable with anyone in your familyo No Has anyone forced you do things that you didnot want to doo No Do you have any thoughts of harming yourselfo No Patient displays signs or symptoms of abuse and/or neglect. No Electronic Signature(s) Signed: 05/04/2016 5:31:29 PM By: Regan Lemming BSN, RN Entered By: Regan Lemming on 05/04/2016 12:40:24 Jeffery Simon ([id number]) -------------------------------------------------------------------------------- Activities of Daily Living Details Patient Name: Jeffery Simon. Date of Service: 05/04/2016 12:45 PM Medical Record Number: [id number] Patient Account Number: [id number] Date of Birth/Sex: [birth year] (51 y.o. Male) Treating RN: Baruch Gouty, RN, BSN, [place] Primary [facility] Jerell Demery: Greig Right Other Clinician: Referring Shekera Beavers: HOBBS, PHILLIP Treating Nazir Hacker/Extender: Ricard Dillon Weeks in Treatment: 0 Activities of Daily Living Items Answer Activities of Daily Living (Please select one for each item) Drive Automobile Completely Able Take Medications Completely Able Use Telephone Completely Able Care for Appearance Completely Able Use Toilet Completely Able Bath / Shower Completely Able Dress Self Completely Able Feed Self Completely Able Walk Completely Able Get In / Out Bed Completely Able Housework Completely  Able Prepare Meals Completely [place] for Self Completely Able Electronic Signature(s) Signed: 05/04/2016 5:31:29 PM By: Regan Lemming BSN, RN Entered By: Regan Lemming on 05/04/2016 12:42:12 Jeffery Simon ([id number]) -------------------------------------------------------------------------------- Education Assessment Details Patient Name: Jeffery Simon. Date of Service: 05/04/2016 12:45 PM Medical Record Number: [id number] Patient Account Number: [id number] Date of Birth/Sex: 1965-07-22 (51 y.o. Male) Treating RN: Baruch Gouty, RN, BSN, [place] Primary Care Tasheka Houseman: Greig Right Other Clinician: Referring Ronnica Dreese: HOBBS, PHILLIP Treating Shota Kohrs/Extender: Tito Dine in Treatment: 0 Primary Learner Assessed: Patient Learning Preferences/Education Level/Primary Language Learning Preference: Explanation Highest Education Level: College or Above Preferred Language: English Cognitive Barrier Assessment/Beliefs Language Barrier: No Physical Barrier Assessment Impaired Vision: Yes Glasses Impaired Hearing: No Decreased Hand dexterity: No Knowledge/Comprehension Assessment Knowledge Level: High Comprehension Level: High Ability to understand written High instructions: Ability to understand verbal High instructions: Motivation Assessment Anxiety Level: Calm Cooperation: Cooperative Education Importance: Acknowledges Need Interest in Health Problems: Asks Questions Perception: Coherent Willingness to Engage in Self- High Management Activities: Readiness to Engage in Self- High Management Activities: Electronic Signature(s) Signed: 05/04/2016 5:31:29 PM By: Regan Lemming BSN, RN Entered By: Regan Lemming on 05/04/2016 12:42:44 Jeffery Simon ([id number]) -------------------------------------------------------------------------------- Fall Risk Assessment Details Patient Name: Jeffery Simon. Date of Service: 05/04/2016 12:45  PM Medical Record Number: [id number] Patient Account Number: [id number] Date of Birth/Sex: [birth year] (51 y.o. Male) Treating RN: Baruch Gouty, RN, BSN, [place] Primary Care Benjimin Hadden: Greig Right Other Clinician: Referring Koda Defrank: HOBBS, PHILLIP Treating Aveer Bartow/Extender: Ricard Dillon Weeks in Treatment: 0 Fall Risk Assessment Items Have you had 2 or more falls in the last 12 monthso 0 No Have you had any fall that resulted in injury in the last 12 monthso 0 No FALL RISK ASSESSMENT: History of falling - immediate or within 3 months 0 No Secondary diagnosis 0 No Ambulatory aid None/bed rest/wheelchair/nurse 0 Yes Crutches/cane/walker 0 No Furniture 0  No IV Access/Saline Lock 0 No Gait/Training Normal/bed rest/immobile 0 Yes Weak 0 No Impaired 0 No Mental Status Oriented to own ability 0 Yes Electronic Signature(s) Signed: 05/04/2016 5:31:29 PM By: Regan Lemming BSN, RN Entered By: Regan Lemming on 05/04/2016 12:42:59 Jeffery Simon ([id number]) -------------------------------------------------------------------------------- Foot Assessment Details Patient Name: Jeffery Simon. Date of Service: 05/04/2016 12:45 PM Medical Record Number: [id number] Patient Account Number: [id number] Date of Birth/Sex: [birth year] (51 y.o. Male) Treating RN: Baruch Gouty, RN, BSN, [place] Primary [facility] Mliss Wedin: Greig Right Other Clinician: Referring Karlyn Glasco: HOBBS, PHILLIP Treating Trevonte Ashkar/Extender: Ricard Dillon Weeks in Treatment: 0 Foot Assessment Items Site Locations + = Sensation present, - = Sensation absent, C = Callus, U = Ulcer R = Redness, W = Warmth, M = Maceration, PU = Pre-ulcerative lesion F = Fissure, S = Swelling, D = Dryness Assessment Right: Left: Other Deformity: No No Prior Foot Ulcer: No No Prior Amputation: No No Charcot Joint: No No Ambulatory Status: Ambulatory Without Help Gait: Steady Electronic Signature(s) Signed: 05/04/2016 5:31:29 PM By: Regan Lemming BSN,  RN Entered By: Regan Lemming on 05/04/2016 12:43:30 Jeffery Simon ([id number]) -------------------------------------------------------------------------------- Nutrition Risk Assessment Details Patient Name: Jeffery Simon. Date of Service: 05/04/2016 12:45 PM Medical Record Number: [id number] Patient Account Number: [id number] Date of Birth/Sex: 12-27-1965 (51 y.o. Male) Treating RN: Baruch Gouty, RN, BSN, [place] Primary Care Cristy Colmenares: Greig Right Other Clinician: Referring Sevyn Markham: HOBBS, PHILLIP Treating Hagan Maltz/Extender: Ricard Dillon Weeks in Treatment: 0 Height (in): 71 Weight (lbs): 389 Body Mass Index (BMI): 54.2 Nutrition Risk Assessment Items NUTRITION RISK SCREEN: I have an illness or condition that made me change the kind and/or 0 No amount of food I eat I eat fewer than two meals per day 0 No I eat few fruits and vegetables, or milk products 0 No I have three or more drinks of beer, liquor or wine almost every day 0 No I have tooth or mouth problems that make it hard for me to eat 0 No I don't always have enough money to buy the food I need 0 No I eat alone most of the time 0 No I take three or more different prescribed or over-the-counter drugs a 0 No day Without wanting to, I have lost or gained 10 pounds in the last six 0 No months I am not always physically able to shop, cook and/or feed myself 0 No Nutrition Protocols Good Risk Protocol 0 No interventions needed Moderate Risk Protocol Electronic Signature(s) Signed: 05/04/2016 5:31:29 PM By: Regan Lemming BSN, RN Entered By: Regan Lemming on 05/04/2016 12:43:10

## 2016-05-11 ENCOUNTER — Encounter: Payer: BLUE CROSS/BLUE SHIELD | Admitting: Internal Medicine

## 2016-05-11 DIAGNOSIS — Z6841 Body Mass Index (BMI) 40.0 and over, adult: Secondary | ICD-10-CM | POA: Diagnosis not present

## 2016-05-11 DIAGNOSIS — T8131XD Disruption of external operation (surgical) wound, not elsewhere classified, subsequent encounter: Secondary | ICD-10-CM | POA: Diagnosis not present

## 2016-05-11 DIAGNOSIS — G4733 Obstructive sleep apnea (adult) (pediatric): Secondary | ICD-10-CM | POA: Diagnosis not present

## 2016-05-11 DIAGNOSIS — I87332 Chronic venous hypertension (idiopathic) with ulcer and inflammation of left lower extremity: Secondary | ICD-10-CM | POA: Diagnosis not present

## 2016-05-11 DIAGNOSIS — L97323 Non-pressure chronic ulcer of left ankle with necrosis of muscle: Secondary | ICD-10-CM | POA: Diagnosis not present

## 2016-05-11 DIAGNOSIS — T8189XA Other complications of procedures, not elsewhere classified, initial encounter: Secondary | ICD-10-CM | POA: Diagnosis not present

## 2016-05-11 DIAGNOSIS — I10 Essential (primary) hypertension: Secondary | ICD-10-CM | POA: Diagnosis not present

## 2016-05-13 NOTE — Progress Notes (Signed)
Jeffery Simon, Jeffery Simon ([id number]) Visit Report for 05/11/2016 Chief Complaint Document Details Patient Name: Jeffery Simon, QUANT. Date of Service: 05/11/2016 3:00 PM Medical Record Number: [id number] Patient Account Number: [id number] Date of Birth/Sex: Oct 13, 1965 (51 y.o. Male) Treating RN: Baruch Gouty, RN, BSN, [place] Primary Care Provider: Greig Right Other Clinician: Referring Provider: Greig Right Treating Provider/Extender: Ricard Dillon Weeks in Treatment: 1 Information Obtained from: Patient Chief Complaint 05/04/16; Patient is here for review of a post surgical wound on the left medial ankle Electronic Signature(s) Signed: 05/12/2016 7:57:33 AM By: Linton Ham MD Entered By: Linton Ham on 05/11/2016 17:15:37 Jeffery Simon ([id number]) -------------------------------------------------------------------------------- HPI Details Patient Name: Jeffery Simon. Date of Service: 05/11/2016 3:00 PM Medical Record Number: [id number] Patient Account Number: [id number] Date of Birth/Sex: 12/04/1965 (51 y.o. Male) Treating RN: Baruch Gouty, RN, BSN, [place] Primary Care Provider: Greig Right Other Clinician: Referring Provider: Greig Right Treating Provider/Extender: Ricard Dillon Weeks in Treatment: 1 History of Present Illness HPI Description: 05/04/16; this is a 51 year old nondiabetic non-smoking man who is here for review of a wound on the left medial ankle. He has severe hypertension, severe obstructive sleep apnea on CPAP and morbid obesity. Is a problem apparently began in December with small veins that ruptured causing copious amounts of bleeding. He followed with Dr. [name] of [facility] underwent laser ablation in the left leg in January and on the right leg later on. He tolerated both of these procedures well. However he was admitted from 04/24/16 through 04/27/16 with cellulitis involving the left ankle. CT scan showed circumferential severe edema with posterior medial ankle  fluid collection. He was given vancomycin and Rocephin and was seen by Dr. [name] and underwent an IandD of the area. Aspiration of the left ankle was negative. Culture showed MRSA and he was discharged on doxycycline which she is still taking and will remain on total next week. He was discharged with iodoform packing to the surgical wound and a Ace wrap that is being applied by his wife. He was treated with vancomycin in the hospital. The patient does not have a prior history of lower extremity wounds although he has chronic venous insufficiency with inflammation and secondary lymphedema. Echocardiogram done in the hospital showed an EF of 50-55%. Grade 2 diastolic dysfunction ABIs in this clinic were 1.03 on the right 0.9 on the left 05/11/16; patient arrives back in clinic today for review of a surgical area on the left medial ankle. He has completed his antibiotics there is no evidence of surrounding infection and no pain. We started silver alginate last week in place of iodoform packing. Electronic Signature(s) Signed: 05/12/2016 7:57:33 AM By: Linton Ham MD Entered By: Linton Ham on 05/11/2016 17:16:14 Jeffery Simon ([id number]) -------------------------------------------------------------------------------- Physical Exam Details Patient Name: Jeffery Simon. Date of Service: 05/11/2016 3:00 PM Medical Record Number: [id number] Patient Account Number: [id number] Date of Birth/Sex: Jun 25, 1965 (51 y.o. Male) Treating RN: Baruch Gouty, RN, BSN, [place] Primary Care Provider: Greig Right Other Clinician: Referring Provider: Greig Right Treating Provider/Extender: Ricard Dillon Weeks in Treatment: 1 Constitutional Patient is hypertensive.. Pulse regular and within target range for patient.Marland Kitchen Respirations regular, non-labored and within target range.. Temperature is normal and within the target range for the patient.. Patient's appearance is neat and clean. Appears in no  acute distress. Well nourished and well developed.. Cardiovascular Pedal pulses palpable and strong bilaterally.. Notes Would exam; the patient has a surgical open area on the medial aspect of his left ankle. This has a clean healthy  base of the wound at healthy walls although there is still considerable relative depth of this. There was no need for debridement. No cultures were done no surrounding soft tissue tenderness. Electronic Signature(s) Signed: 05/12/2016 7:57:33 AM By: Linton Ham MD Entered By: Linton Ham on 05/11/2016 17:17:44 Jeffery Simon ([id number]) -------------------------------------------------------------------------------- Physician Orders Details Patient Name: Jeffery Simon. Date of Service: 05/11/2016 3:00 PM Medical Record Number: [id number] Patient Account Number: [id number] Date of Birth/Sex: 1965/05/28 (51 y.o. Male) Treating RN: Baruch Gouty, RN, BSN, [place] Primary Care Provider: Greig Right Other Clinician: Referring Provider: Greig Right Treating Provider/Extender: Tito Dine in Treatment: 1 Verbal / Phone Orders: No Diagnosis Coding Wound Cleansing Wound #1 Left,Medial Malleolus o Cleanse wound with mild soap and water o May Shower, gently pat wound dry prior to applying new dressing. Anesthetic Wound #1 Left,Medial Malleolus o Topical Lidocaine 4% cream applied to wound bed prior to debridement Skin Barriers/Peri-Wound Care Wound #1 Left,Medial Malleolus o Triamcinolone Acetonide Ointment Primary Wound Dressing Wound #1 Left,Medial Malleolus o Prisma Ag Secondary Dressing o ABD pad Dressing Change Frequency Wound #1 Left,Medial Malleolus o Change dressing every week Follow-up Appointments Wound #1 Left,Medial Malleolus o Return Appointment in 1 week. Edema Control Wound #1 Left,Medial Malleolus o 4-Layer Compression System - Left Lower Extremity o Elevate legs to the level of the heart and  pump ankles as often as possible Additional Orders / Instructions Wound #1 Left,Medial Malleolus Jeffery Simon, [name] ([id number]) o Increase protein intake. o Activity as tolerated Negative Pressure Wound Therapy Wound #1 Left,Medial Malleolus o Wound VAC settings at 125/130 mmHg continuous pressure. Use BLACK/GREEN foam to wound cavity. Use WHITE foam to fill any tunnel/s and/or undermining. Change VAC dressing 3 X WEEK. Change canister as indicated when full. Nurse may titrate settings and frequency of dressing changes as clinically indicated. - ORDER FOR NEXT WEEK/ TRADITIONAL VAC VS SNAP VAC Electronic Signature(s) Signed: 05/11/2016 4:51:03 PM By: Regan Lemming BSN, RN Signed: 05/12/2016 7:57:33 AM By: Linton Ham MD Entered By: Regan Lemming on 05/11/2016 15:53:24 Jeffery Simon ([id number]) -------------------------------------------------------------------------------- Problem List Details Patient Name: Jeffery Simon, MAALOUF. Date of Service: 05/11/2016 3:00 PM Medical Record Number: [id number] Patient Account Number: [id number] Date of Birth/Sex: [birth year] (51 y.o. 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 Date of Service: 05/11/2016 3:00 PM Medical Record Number: [id number] Patient Account Number: [id number] Date of Birth/Sex: [birth year] (51 y.o. Male) Treating RN: Baruch Gouty, RN, BSN, [place] Primary Care Provider: Greig Right Other Clinician: Referring Provider: Greig Right Treating Provider/Extender: Tito Dine in Treatment: 1 Subjective Chief Complaint Information obtained from Patient 05/04/16; Patient is here for review of a post surgical wound on the left medial ankle History of Present Illness (HPI) 05/04/16; this is a 51 year old nondiabetic non-smoking man who is here for review of a wound on the left medial ankle. He has severe hypertension, severe obstructive sleep  apnea on CPAP and morbid obesity. Is a problem apparently began in December with small veins that ruptured causing copious amounts of bleeding. He followed with Dr. [name] of [facility] underwent laser ablation in the left leg in January and on the right leg later on. He tolerated both of these procedures well. However he was admitted from 04/24/16 through 04/27/16 with cellulitis involving the left ankle. CT scan showed circumferential severe edema with posterior medial ankle fluid collection. He was given vancomycin and Rocephin and was seen by Dr. [name] and underwent an IandD of the area. Aspiration of the left ankle was negative. Culture showed MRSA and he was discharged on doxycycline which she is still taking and will remain on total next week. He was discharged with iodoform packing to the surgical wound and a Ace wrap that is being applied by his wife. He was treated with vancomycin in the hospital. The patient does not have a prior history of lower extremity wounds although he has chronic venous insufficiency with inflammation and secondary lymphedema.  Echocardiogram done in the hospital showed an EF of 50-55%. Grade 2 diastolic dysfunction ABIs in this clinic were 1.03 on the right 0.9 on the left 05/11/16; patient arrives back in clinic today for review of a surgical area on the left medial ankle. He has completed his antibiotics there is no evidence of surrounding infection and no pain. We started silver alginate last week in place of iodoform packing. Objective Constitutional Patient is hypertensive.. Pulse regular and within target range for patient.Marland Kitchen Respirations regular, non-labored and within target range.. Temperature is normal and within the target range for the patient.. Patient's appearance is neat and clean. Appears in no acute distress. Well nourished and well developed.Marland Kitchen Jeffery Simon, LAMOUR. ([id number]) Vitals Time Taken: 3:18 PM, Height: 71 in, Weight: 389 lbs, BMI: 54.2, Temperature: 98.1 F, Pulse: 75 bpm, Respiratory Rate: 17 breaths/min, Blood Pressure: 157/86 mmHg. Cardiovascular Pedal pulses palpable and strong bilaterally.. General Notes: Would exam; the patient has a surgical open area on the medial aspect of his left ankle. This has a clean healthy base of the wound at healthy walls although there is still considerable relative depth of this. There was no need for debridement. No cultures were done no surrounding soft tissue tenderness. Integumentary (Hair, Skin) Wound #1 status is Open. Original cause of wound was Gradually Appeared. The wound is located on the Left,Medial Malleolus. The wound measures 1cm length x 1cm width x 1.1cm depth; 0.785cm^2 area and 0.864cm^3 volume. There is Fat Layer (Subcutaneous Tissue) Exposed exposed. There is no tunneling or undermining noted. There is a medium amount of serosanguineous drainage noted. The wound margin is distinct with the outline attached to the wound base. There is medium (34-66%) pink granulation within the wound bed. There is a medium (34-66%) amount of necrotic  tissue within the wound bed. The periwound skin appearance exhibited: Mottled, Erythema. The periwound skin appearance did not exhibit: Callus, Crepitus, Excoriation, Induration, Rash, Scarring, Dry/Scaly, Maceration, Atrophie Blanche, Cyanosis, Ecchymosis, Hemosiderin Staining, Pallor, Rubor. The surrounding wound skin color is noted with erythema which is circumferential. Periwound temperature was noted as No Abnormality. The periwound has tenderness on palpation. 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 Anesthetic: Wound #1 Left,Medial Malleolus: Topical Lidocaine 4% cream applied to wound bed prior to debridement Skin Barriers/Peri-Wound Care: Wound #1 Left,Medial Malleolus: Triamcinolone Acetonide Ointment Primary Wound Dressing: Wound #1 Left,Medial Malleolus: Prisma Ag Secondary Dressing: ABD pad Dressing Change Frequency: Wound #1 Left,Medial Malleolus: Change dressing every week Follow-up Appointments: Wound #1 Left,Medial Malleolus: Return Appointment in 1 week. Edema Control: Wound #1 Left,Medial Malleolus: 4-Layer Compression System - Left Lower Extremity Elevate legs to the level of the heart and pump ankles as often as possible Additional Orders / Instructions: Wound #1 Left,Medial Malleolus: Increase protein intake. Activity as tolerated Negative Pressure Wound Therapy: Wound #1 Left,Medial Malleolus: Wound VAC settings at 125/130 mmHg continuous pressure. Use BLACK/GREEN foam to wound  cavity. Use WHITE foam to fill any tunnel/s and/or undermining. Change VAC dressing 3 X WEEK. Change canister as indicated when full. Nurse may titrate settings and frequency of dressing changes as clinically indicated. - ORDER FOR NEXT WEEK/ TRADITIONAL VAC VS SNAP VAC #1 I switched the primary dressing this to collagen and border foam #2 I think a wound VAC might be in order. 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 A [employer] wound VAC would probably be difficult to snapvac might be a better option although I'm not sure if the insurance he has is in line with this. We will try to look into this this week. #3 if a VAC turns out not to be an option because of work/insurance issues perhaps Oasis might be in order here. Electronic Signature(s) Jeffery Simon, Jeffery Simon ([id number]) Signed: 05/12/2016 7:57:33 AM By: Linton Ham MD Entered By: Linton Ham on 05/11/2016 17:19:26 Jeffery Simon, Jeffery Simon ([id number]) -------------------------------------------------------------------------------- [place] Details Patient Name: Jeffery Simon. Date of Service: 05/11/2016 Medical Record Number: [id number] Patient Account Number: [id number] Date of Birth/Sex: 03-02-1965 (51 y.o. 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 Facility Procedures CPT4: Description Modifier Quantity Code [id number] (Facility Use Only) [phone] - [facility] 1 LEG Physician  Procedures CPT4: Description Modifier Quantity Code [id number] 10258 - WC PHYS LEVEL 2 - EST PT 1 ICD-10 Description Diagnosis T81.31XD Disruption of external operation (surgical) wound, not elsewhere classified, subsequent encounter L97.323 Non-pressure chronic ulcer  of left ankle with necrosis of muscle Electronic Signature(s) Signed: 05/12/2016 12:03:24 PM By: Regan Lemming BSN, RN Signed: 05/12/2016 5:12:22 PM By: Linton Ham MD Previous Signature: 05/12/2016 7:57:33 AM Version By: Linton Ham MD Previous Signature: 05/11/2016 5:19:50 PM Version By: Regan Lemming BSN, RN Entered By: Regan Lemming on 05/12/2016 12:03:23

## 2016-05-13 NOTE — Progress Notes (Addendum)
Jeffery Simon, Jeffery Simon ([id number]) Visit Report for 05/11/2016 Arrival Information Details Patient Name: Jeffery Simon, Jeffery Simon. Date of Service: 05/11/2016 3:00 PM Medical Record Number: [id number] Patient Account Number: [id number] Date of Birth/Sex: [birth year] (51 y.o. Male) Treating RN: Baruch Gouty, RN, BSN, [employer] Primary Care Daizha Anand: Greig Right Other Clinician: Referring Elisama Thissen: Greig Right Treating Jayron Maqueda/Extender: Tito Dine in Treatment: 1 Visit Information History Since Last Visit All ordered tests and consults were completed: No Patient Arrived: Ambulatory Added or deleted any medications: No Arrival Time: 15:16 Any new allergies or adverse reactions: No Accompanied By: wife Had a fall or experienced change in No Transfer Assistance: None activities of daily living that may affect Patient Identification Verified: Yes risk of falls: Secondary Verification Process Yes Signs or symptoms of abuse/neglect since last No Completed: visito Patient Requires Transmission-Based No Has Compression in Place as Prescribed: Yes Precautions: Pain Present Now: No Patient Has Alerts: No Electronic Signature(s) Signed: 05/11/2016 4:51:03 PM By: Regan Lemming BSN, RN Entered By: Regan Lemming on 05/11/2016 15:18:30 Jeffery Simon ([id number]) -------------------------------------------------------------------------------- Encounter Discharge Information Details Patient Name: Jeffery Simon. Date of Service: 05/11/2016 3:00 PM Medical Record Number: [id number] Patient Account Number: [id number] Date of Birth/Sex: [birth year] (51 y.o. Male) Treating RN: Baruch Gouty, RN, BSN, [place] Primary Care Talib Headley: Greig Right Other Clinician: Referring Brooklynne Pereida: Greig Right Treating Jozette Castrellon/Extender: Tito Dine in Treatment: 1 Encounter Discharge Information Items Discharge Pain Level: 0 Discharge Condition: Stable Ambulatory Status: Ambulatory Discharge Destination:  Home Transportation: Private Auto Accompanied By: wife Schedule Follow-up Appointment: No Medication Reconciliation completed and provided to Patient/Care No Brylyn Novakovich: Provided on Clinical Summary of Care: 05/11/2016 Form Type Recipient Paper Patient HL Electronic Signature(s) Signed: 05/11/2016 5:24:08 PM By: Regan Lemming BSN, RN Previous Signature: 05/11/2016 4:05:01 PM Version By: Ruthine Dose Entered By: Regan Lemming on 05/11/2016 17:24:08 Jeffery Simon ([id number]) -------------------------------------------------------------------------------- Lower Extremity Assessment Details Patient Name: Jeffery Simon. Date of Service: 05/11/2016 3:00 PM Medical Record Number: [id number] Patient Account Number: [id number] Date of Birth/Sex: Jan 25, 1966 (51 y.o. Male) Treating RN: Baruch Gouty, RN, BSN, [place] Primary Care Javonte Elenes: Greig Right Other Clinician: Referring Ingvald Theisen: Greig Right Treating Caedyn Tassinari/Extender: Ricard Dillon Weeks in Treatment: 1 Edema Assessment Assessed: [Left: No] [Right: No] Edema: [Left: Ye] [Right: s] Calf Left: Right: Point of Measurement: 38 cm From Medial Instep 55.4 cm cm Ankle Left: Right: Point of Measurement: 12 cm From Medial Instep 30 cm cm Vascular Assessment Claudication: Claudication Assessment [Left:None] Pulses: Dorsalis Pedis Palpable: [Left:Yes] Posterior Tibial Extremity colors, hair growth, and conditions: Extremity Color: [Left:Normal] Hair Growth on Extremity: [Left:Yes] Temperature of Extremity: [Left:Warm] Capillary Refill: [Left:< 3 seconds] Toe Nail Assessment Left: Right: Thick: Yes Discolored: Yes Deformed: No Improper Length and Hygiene: No Electronic Signature(s) Signed: 05/11/2016 4:51:03 PM By: Regan Lemming BSN, RN Entered By: Regan Lemming on 05/11/2016 15:35:18 Jeffery Simon ([id number] Lobo, Gerrie Nordmann  ([id number]) -------------------------------------------------------------------------------- Multi Wound Chart Details Patient Name: Jeffery Simon. Date of Service: 05/11/2016 3:00 PM Medical Record Number: [id number] Patient Account Number: [id number] Date of Birth/Sex: 03/05/1965 (51 y.o. Male) Treating RN: Baruch Gouty, RN, BSN, [place] Primary Care Atalaya Zappia: Greig Right Other Clinician: Referring Willetta York: Greig Right Treating Jacori Mulrooney/Extender: Ricard Dillon Weeks in Treatment: 1 Vital Signs Height(in): 71 Pulse(bpm): 75 Weight(lbs): 389 Blood Pressure 157/86 (mmHg): Body Mass Index(BMI): 54 Temperature(F): 98.1 Respiratory Rate 17 (breaths/min): Photos: [N/A:N/A] Wound Location: Left Malleolus - Medial N/A N/A Wounding Event: Gradually Appeared N/A N/A Primary Etiology: Dehisced Wound N/A N/A Comorbid History: Sleep Apnea, N/A  N/A Hypertension Date Acquired: 04/25/2016 N/A N/A Weeks of Treatment: 1 N/A N/A Wound Status: Open N/A N/A Measurements L x W x D 1x1x1.1 N/A N/A (cm) Area (cm) : 0.785 N/A N/A Volume (cm) : 0.864 N/A N/A % Reduction in Area: -66.70% N/A N/A % Reduction in Volume: -52.90% N/A N/A Classification: Full Thickness Without N/A N/A Exposed Support Structures Exudate Amount: Medium N/A N/A Exudate Type: Serosanguineous N/A N/A Exudate Color: red, brown N/A N/A Wound Margin: Distinct, outline attached N/A N/A Granulation Amount: Medium (34-66%) N/A N/A Granulation Quality: Pink N/A N/A Jeffery Simon, Jeffery Simon ([id number]) Necrotic Amount: Medium (34-66%) N/A N/A Exposed Structures: Fat Layer (Subcutaneous N/A N/A Tissue) Exposed: Yes Fascia: No Tendon: No Muscle: No Joint: No Bone: No Epithelialization: None N/A N/A Periwound Skin Texture: Excoriation: No N/A N/A Induration: No Callus: No Crepitus: No Rash: No Scarring: No Periwound Skin Maceration: No N/A N/A Moisture: Dry/Scaly: No Periwound Skin Color: Erythema: Yes N/A  N/A Mottled: Yes Atrophie Blanche: No Cyanosis: No Ecchymosis: No Hemosiderin Staining: No Pallor: No Rubor: No Erythema Location: Circumferential N/A N/A Temperature: No Abnormality N/A N/A Tenderness on Yes N/A N/A Palpation: Wound Preparation: Ulcer Cleansing: Other: N/A N/A surg scrub and water Topical Anesthetic Applied: Other: lidocaine 4% Treatment Notes Electronic Signature(s) Signed: 05/12/2016 7:57:33 AM By: Linton Ham MD Previous Signature: 05/11/2016 4:51:03 PM Version By: Regan Lemming BSN, RN Entered By: Linton Ham on 05/11/2016 17:15:30 Jeffery Simon ([id number]) -------------------------------------------------------------------------------- Crystal Details Patient Name: Jeffery Simon, Jeffery Simon. Date of Service: 05/11/2016 3:00 PM Medical Record Number: [id number] Patient Account Number: [id number] Date of Birth/Sex: 03-Jun-1965 (51 y.o. Male) Treating RN: Baruch Gouty, RN, BSN, [facility] Primary Care Sherilyn Windhorst: Greig Right Other Clinician: Referring Reeya Bound: Greig Right Treating Evangelyn Crouse/Extender: Tito Dine in Treatment: 1 Active Inactive ` Orientation to the Wound Care Program Nursing Diagnoses: Knowledge deficit related to the wound healing center program Goals: Patient/caregiver will verbalize understanding of the [place] Program Date Initiated: 05/04/2016 Target Resolution Date: 09/03/2016 Goal Status: Active Interventions: Provide education on orientation to the wound center Notes: ` Venous Leg Ulcer Nursing Diagnoses: Actual venous Insuffiency (use after diagnosis is confirmed) Potential for venous Insuffiency (use before diagnosis confirmed) Goals: Non-invasive venous studies are completed as ordered Date Initiated: 05/04/2016 Target Resolution Date: 09/03/2016 Goal Status: Active Patient will maintain optimal edema control Date Initiated: 05/04/2016 Target Resolution Date: 09/03/2016 Goal  Status: Active Patient/caregiver will verbalize understanding of disease process and disease management Date Initiated: 05/04/2016 Target Resolution Date: 09/03/2016 Goal Status: Active Verify adequate tissue perfusion prior to therapeutic compression application Date Initiated: 05/04/2016 Target Resolution Date: 09/03/2016 Goal Status: Active Jeffery Simon, KUNST. ([id number]) Interventions: Assess peripheral edema status every visit. Compression as ordered Provide education on venous insufficiency Treatment Activities: Therapeutic compression applied : 05/04/2016 Notes: ` Wound/Skin Impairment Nursing Diagnoses: Impaired tissue integrity Knowledge deficit related to ulceration/compromised skin integrity Goals: Patient/caregiver will verbalize understanding of skin care regimen Date Initiated: 05/04/2016 Target Resolution Date: 09/03/2016 Goal Status: Active Ulcer/skin breakdown will have a volume reduction of 30% by week 4 Date Initiated: 05/04/2016 Target Resolution Date: 09/03/2016 Goal Status: Active Ulcer/skin breakdown will have a volume reduction of 50% by week 8 Date Initiated: 05/04/2016 Target Resolution Date: 09/03/2016 Goal Status: Active Ulcer/skin breakdown will have a volume reduction of 80% by week 12 Date Initiated: 05/04/2016 Target Resolution Date: 09/03/2016 Goal Status: Active Ulcer/skin breakdown will heal within 14 weeks Date Initiated: 05/04/2016 Target Resolution Date: 09/03/2016 Goal Status: Active Interventions: Assess patient/caregiver ability  to obtain necessary supplies Assess patient/caregiver ability to perform ulcer/skin care regimen upon admission and as needed Assess ulceration(s) every visit Provide education on ulcer and skin care Treatment Activities: Referred to DME Lyly Canizales for dressing supplies : 05/04/2016 Skin care regimen initiated : 05/04/2016 Topical wound management initiated : 05/04/2016 Jeffery Simon, Jeffery Simon ([id number]) Notes: Electronic  Signature(s) Signed: 05/11/2016 4:51:03 PM By: Regan Lemming BSN, RN Entered By: Regan Lemming on 05/11/2016 15:35:23 Jeffery Simon ([id number]) -------------------------------------------------------------------------------- Pain Assessment Details Patient Name: Jeffery Simon. Date of Service: 05/11/2016 3:00 PM Medical Record Number: [id number] Patient Account Number: [id number] Date of Birth/Sex: [birth year] (51 y.o. Male) Treating RN: Baruch Gouty, RN, BSN, [place] Primary Care Averiana Clouatre: Greig Right Other Clinician: Referring Anthon Harpole: Greig Right Treating Tykia Mellone/Extender: Ricard Dillon Weeks in Treatment: 1 Active Problems Location of Pain Severity and Description of Pain Patient Has Paino No Site Locations With Dressing Change: No Pain Management and Medication Current Pain Management: Electronic Signature(s) Signed: 05/11/2016 4:51:03 PM By: Regan Lemming BSN, RN Entered By: Regan Lemming on 05/11/2016 15:18:38 Jeffery Simon ([id number]) -------------------------------------------------------------------------------- Patient/Caregiver Education Details Patient Name: Jeffery Simon. Date of Service: 05/11/2016 3:00 PM Medical Record Number: [id number] Patient Account Number: [id number] Date of Birth/Gender: October 09, 1965 (51 y.o. Male) Treating RN: Baruch Gouty, RN, BSN, [facility] Harman Primary Care Physician: Greig Right Other Clinician: Referring Physician: Greig Right Treating Physician/Extender: Tito Dine in Treatment: 1 Education Assessment Education Provided To: Patient Education Topics Provided Venous: Methods: Explain/Verbal Responses: State content correctly Welcome To [facility] [place]: Methods: Explain/Verbal Responses: State content correctly Wound/Skin Impairment: Methods: Explain/Verbal Responses: State content correctly Electronic Signature(s) Signed: 05/11/2016 5:27:52 PM By: Regan Lemming BSN, RN Entered By: Regan Lemming on 05/11/2016  17:24:28 Jeffery Simon ([id number]) -------------------------------------------------------------------------------- Wound Assessment Details Patient Name: Jeffery Simon. Date of Service: 05/11/2016 3:00 PM Medical Record Number: [id number] Patient Account Number: [id number] Date of Birth/Sex: Jan 25, 1966 (51 y.o. Male) Treating RN: Baruch Gouty, RN, BSN, [place] Primary Care Kendell Sagraves: Greig Right Other Clinician: Referring Daryan Buell: Greig Right Treating Anh Bigos/Extender: Ricard Dillon Weeks in Treatment: 1 Wound Status Wound Number: 1 Primary Etiology: Dehisced Wound Wound Location: Left Malleolus - Medial Wound Status: Open Wounding Event: Gradually Appeared Comorbid History: Sleep Apnea, Hypertension Date Acquired: 04/25/2016 Weeks Of Treatment: 1 Clustered Wound: No Photos Wound Measurements Length: (cm) 1 Width: (cm) 1 Depth: (cm) 1.5 Area: (cm) 0.785 Volume: (cm) 1.178 % Reduction in Area: -66.7% % Reduction in Volume: -108.5% Epithelialization: None Tunneling: No Undermining: No Wound Description Full Thickness Without Exposed Classification: Support Structures Wound Margin: Distinct, outline attached Exudate Medium Amount: Exudate Type: Serosanguineous Exudate Color: red, brown Foul Odor After Cleansing: No Slough/Fibrino Yes Wound Bed Granulation Amount: Medium (34-66%) Exposed Structure Granulation Quality: Pink Fascia Exposed: No Necrotic Amount: Medium (34-66%) Fat Layer (Subcutaneous Tissue) Exposed: Yes Jeffery Simon, Jeffery Simon ([id number]) Necrotic Quality: Adherent Slough Tendon Exposed: No Muscle Exposed: No Joint Exposed: No Bone Exposed: No Periwound Skin Texture Texture Color No Abnormalities Noted: No No Abnormalities Noted: No Callus: No Atrophie Blanche: No Crepitus: No Cyanosis: No Excoriation: No Ecchymosis: No Induration: No Erythema: Yes Rash: No Erythema Location: Circumferential Scarring: No Hemosiderin Staining:  Yes Mottled: Yes Moisture Pallor: No No Abnormalities Noted: No Rubor: No Dry / Scaly: No Maceration: No Temperature / Pain Temperature: No Abnormality Tenderness on Palpation: Yes Wound Preparation Ulcer Cleansing: Other: surg scrub and water, Topical Anesthetic Applied: Other: lidocaine 4%, Treatment Notes Wound #1 (Left, Medial Malleolus) 1. Cleansed with: Cleanse wound with antibacterial soap and  water 4. Dressing Applied: Prisma Ag 5. Secondary Dressing Applied ABD Pad 7. Secured with 4-Layer Compression System - Left Lower Extremity Electronic Signature(s) Signed: 05/12/2016 8:47:05 AM By: Regan Lemming BSN, RN Previous Signature: 05/11/2016 4:51:03 PM Version By: Regan Lemming BSN, RN Entered By: Regan Lemming on 05/12/2016 08:47:05 Jeffery Simon ([id number]) -------------------------------------------------------------------------------- Vitals Details Patient Name: Jeffery Simon. Date of Service: 05/11/2016 3:00 PM Medical Record Number: [id number] Patient Account Number: [id number] Date of Birth/Sex: Jan 22, 1966 (51 y.o. Male) Treating RN: Baruch Gouty, RN, BSN, [place] Primary Care Ammy Lienhard: Greig Right Other Clinician: Referring Carmella Kees: Greig Right Treating Rochele Lueck/Extender: Ricard Dillon Weeks in Treatment: 1 Vital Signs Time Taken: 15:18 Temperature (F): 98.1 Height (in): 71 Pulse (bpm): 75 Weight (lbs): 389 Respiratory Rate (breaths/min): 17 Body Mass Index (BMI): 54.2 Blood Pressure (mmHg): 157/86 Reference Range: 80 - 120 mg / dl Electronic Signature(s) Signed: 05/11/2016 4:51:03 PM By: Regan Lemming BSN, RN Entered By: Regan Lemming on 05/11/2016 15:18:59

## 2016-05-15 DIAGNOSIS — L2089 Other atopic dermatitis: Secondary | ICD-10-CM | POA: Diagnosis not present

## 2016-05-18 ENCOUNTER — Encounter: Payer: BLUE CROSS/BLUE SHIELD | Admitting: Internal Medicine

## 2016-05-18 DIAGNOSIS — L97323 Non-pressure chronic ulcer of left ankle with necrosis of muscle: Secondary | ICD-10-CM | POA: Diagnosis not present

## 2016-05-18 DIAGNOSIS — G4733 Obstructive sleep apnea (adult) (pediatric): Secondary | ICD-10-CM | POA: Diagnosis not present

## 2016-05-18 DIAGNOSIS — I10 Essential (primary) hypertension: Secondary | ICD-10-CM | POA: Diagnosis not present

## 2016-05-18 DIAGNOSIS — I87332 Chronic venous hypertension (idiopathic) with ulcer and inflammation of left lower extremity: Secondary | ICD-10-CM | POA: Diagnosis not present

## 2016-05-18 DIAGNOSIS — Z6841 Body Mass Index (BMI) 40.0 and over, adult: Secondary | ICD-10-CM | POA: Diagnosis not present

## 2016-05-18 DIAGNOSIS — T8131XD Disruption of external operation (surgical) wound, not elsewhere classified, subsequent encounter: Secondary | ICD-10-CM | POA: Diagnosis not present

## 2016-05-18 DIAGNOSIS — T8189XA Other complications of procedures, not elsewhere classified, initial encounter: Secondary | ICD-10-CM | POA: Diagnosis not present

## 2016-05-19 NOTE — Progress Notes (Addendum)
Jeffery Simon ([id number]) Visit Report for 05/18/2016 Chief Complaint Document Details Patient Name: Jeffery Simon, Jeffery Simon. Date of Service: 05/18/2016 1:15 PM Medical Record Number: [id number] Patient Account Number: [id number] Date of Birth/Sex: 01-17-1966 (51 y.o. Male) Treating RN: Montey Hora Primary Care Provider: Greig Right Other Clinician: Referring Provider: Greig Right Treating Provider/Extender: Ricard Dillon Weeks in Treatment: 2 Information Obtained from: Patient Chief Complaint 05/04/16; Patient is here for review of a post surgical wound on the left medial ankle Electronic Signature(s) Signed: 05/19/2016 7:39:10 AM By: Linton Ham MD Entered By: Linton Ham on 05/18/2016 14:35:53 Jeffery Simon ([id number]) -------------------------------------------------------------------------------- HPI Details Patient Name: Jeffery Simon. Date of Service: 05/18/2016 1:15 PM Medical Record Number: [id number] Patient Account Number: [id number] Date of Birth/Sex: 04/21/1965 (51 y.o. Male) Treating RN: Montey Hora Primary Care Provider: Greig Right Other Clinician: Referring Provider: Greig Right Treating Provider/Extender: Ricard Dillon Weeks in Treatment: 2 History of Present Illness HPI Description: 05/04/16; this is a 51 year old nondiabetic non-smoking man who is here for review of a wound on the left medial ankle. He has severe hypertension, severe obstructive sleep apnea on CPAP and morbid obesity. Is a problem apparently began in December with small veins that ruptured causing copious amounts of bleeding. He followed with Dr. [name] of [facility] underwent laser ablation in the left leg in January and on the right leg later on. He tolerated both of these procedures well. However he was admitted from 04/24/16 through 04/27/16 with cellulitis involving the left ankle. CT scan showed circumferential severe edema with posterior medial ankle fluid  collection. He was given vancomycin and Rocephin and was seen by Dr. [name] and underwent an IandD of the area. Aspiration of the left ankle was negative. Culture showed MRSA and he was discharged on doxycycline which she is still taking and will remain on total next week. He was discharged with iodoform packing to the surgical wound and a Ace wrap that is being applied by his wife. He was treated with vancomycin in the hospital. The patient does not have a prior history of lower extremity wounds although he has chronic venous insufficiency with inflammation and secondary lymphedema. Echocardiogram done in the hospital showed an EF of 50-55%. Grade 2 diastolic dysfunction ABIs in this clinic were 1.03 on the right 0.9 on the left 05/11/16; patient arrives back in clinic today for review of a surgical area on the left medial ankle. He has completed his antibiotics there is no evidence of surrounding infection and no pain. We started silver alginate last week in place of iodoform packing. 05/18/16; the patient's wound is smaller in terms of the orifice and now as 1.3 cm depth versus 1.7 cm last week. He has completed his antibiotics. His insurance did not approve a Snap Vac. The wound is now too small for a standard [employer] electrical Vac. Electronic Signature(s) Signed: 05/19/2016 7:39:10 AM By: Linton Ham MD Entered By: Linton Ham on 05/18/2016 14:37:04 Jeffery Simon ([id number]) -------------------------------------------------------------------------------- Physical Exam Details Patient Name: Jeffery Simon. Date of Service: 05/18/2016 1:15 PM Medical Record Number: [id number] Patient Account Number: [id number] Date of Birth/Sex: 1966-01-07 (51 y.o. Male) Treating RN: Montey Hora Primary Care Provider: Greig Right Other Clinician: Referring Provider: Greig Right Treating Provider/Extender: Ricard Dillon Weeks in Treatment: 2 Constitutional Sitting or standing  Blood Pressure is within target range for patient.. Pulse regular and within target range for patient.Marland Kitchen Respirations regular, non-labored and within target range.. Temperature is normal and within the target range  for the patient.. Patient's appearance is neat and clean. Appears in no acute distress. Well nourished and well developed.. Eyes Conjunctivae clear. No discharge.. Cardiovascular Pedal pulses palpable and strong bilaterally.. Lymphatic None palpable in the popliteal or inguinal area on the left. Psychiatric No evidence of depression, anxiety, or agitation. Calm, cooperative, and communicative. Appropriate interactions and affect.. Notes Wound exam; small surgical open area on the medial aspect of his left ankle. This appears to have a clean wound base with healthy walls. 1.3 cm of depth is an improvement from last week. There is no evidence of circumferential infection. No debridement was required Electronic Signature(s) Signed: 05/19/2016 7:39:10 AM By: Linton Ham MD Entered By: Linton Ham on 05/18/2016 14:38:15 Jeffery Simon ([id number]) -------------------------------------------------------------------------------- Physician Orders Details Patient Name: Jeffery Simon. Date of Service: 05/18/2016 1:15 PM Medical Record Number: [id number] Patient Account Number: [id number] Date of Birth/Sex: 1965/07/07 (51 y.o. Male) Treating RN: Montey Hora Primary Care Provider: Greig Right Other Clinician: Referring Provider: Greig Right Treating Provider/Extender: Tito Dine in Treatment: 2 Verbal / Phone Orders: No Diagnosis Coding Wound Cleansing Wound #1 Left,Medial Malleolus o Cleanse wound with mild soap and water o May Shower, gently pat wound dry prior to applying new dressing. Anesthetic Wound #1 Left,Medial Malleolus o Topical Lidocaine 4% cream applied to wound bed prior to debridement Skin Barriers/Peri-Wound Care Wound #1  Left,Medial Malleolus o Triamcinolone Acetonide Ointment Primary Wound Dressing Wound #1 Left,Medial Malleolus o Prisma Ag Secondary Dressing o ABD pad Dressing Change Frequency Wound #1 Left,Medial Malleolus o Change dressing every week Follow-up Appointments Wound #1 Left,Medial Malleolus o Return Appointment in 1 week. Edema Control Wound #1 Left,Medial Malleolus o 4-Layer Compression System - Left Lower Extremity o Elevate legs to the level of the heart and pump ankles as often as possible Additional Orders / Instructions Wound #1 Left,Medial Malleolus Jeffery Simon, Jeffery Simon. ([id number]) o Increase protein intake. o Activity as tolerated Notes SNAP VAC not covered by patient's insurance, d/c NPWT order please Electronic Signature(s) Signed: 05/18/2016 4:05:06 PM By: Montey Hora Signed: 05/19/2016 7:39:10 AM By: Linton Ham MD Entered By: Montey Hora on 05/18/2016 14:39:31 Jeffery Simon ([id number]) -------------------------------------------------------------------------------- Problem List Details Patient Name: Jeffery Simon, Jeffery Simon. Date of Service: 05/18/2016 1:15 PM Medical Record Number: [id number] Patient Account Number: [id number] Date of Birth/Sex: [birth year] (51 y.o. 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 Date of Service: 05/18/2016 1:15 PM Medical Record Number: [id number] Patient Account Number: [id number] Date of Birth/Sex: [birth year] (51 y.o. Male) Treating RN: Montey Hora Primary Care Provider: Greig Right Other Clinician: Referring Provider: Greig Right Treating Provider/Extender: Tito Dine in Treatment: 2 Subjective Chief Complaint Information obtained from Patient 05/04/16; Patient is here for review of a post surgical wound on the left medial ankle History of Present Illness (HPI) 05/04/16; this is a 51 year old nondiabetic non-smoking man who is here for review of a  wound on the left medial ankle. He has severe hypertension, severe obstructive sleep apnea on CPAP and morbid obesity. Is a problem apparently began in December with small veins that ruptured causing copious amounts of bleeding. He followed with Dr. [name] of [facility] underwent laser ablation in the left leg in January and on the right leg later on. He tolerated both of these procedures well. However he was admitted from 04/24/16 through 04/27/16 with cellulitis involving the left ankle. CT scan showed circumferential severe edema with posterior medial ankle fluid collection. He was given vancomycin and Rocephin and was seen by Dr. [name] and underwent an IandD of the area. Aspiration of the left ankle was negative. Culture showed MRSA and he was discharged on doxycycline which she is still taking and will remain on total next week. He was discharged with iodoform packing to the surgical wound and a Ace wrap that is being applied by his wife. He was treated with vancomycin in the hospital. The patient does not have a prior history of lower extremity wounds although  he has chronic venous insufficiency with inflammation and secondary lymphedema. Echocardiogram done in the hospital showed an EF of 50-55%. Grade 2 diastolic dysfunction ABIs in this clinic were 1.03 on the right 0.9 on the left 05/11/16; patient arrives back in clinic today for review of a surgical area on the left medial ankle. He has completed his antibiotics there is no evidence of surrounding infection and no pain. We started silver alginate last week in place of iodoform packing. 05/18/16; the patient's wound is smaller in terms of the orifice and now as 1.3 cm depth versus 1.7 cm last week. He has completed his antibiotics. His insurance did not approve a Snap Vac. The wound is now too small for a standard [employer] electrical Vac. Objective Constitutional Jeffery Simon, Jeffery Simon. ([id number]) Sitting or standing Blood Pressure is within target range for patient.. Pulse regular and within target range for patient.Marland Kitchen Respirations regular, non-labored and within target range.. Temperature is normal and within the target range for the patient.. Patient's appearance is neat and clean. Appears in no acute distress. Well nourished and well developed.. Vitals Time Taken: 1:31 PM, Height: 71 in, Weight: 389 lbs, BMI: 54.2, Pulse: 83 bpm, Respiratory Rate: 18 breaths/min, Blood Pressure: 129/60 mmHg. Eyes Conjunctivae clear. No discharge.. Cardiovascular Pedal pulses palpable and strong bilaterally.. Lymphatic None palpable in the popliteal or inguinal area on the left. Psychiatric No evidence of depression, anxiety, or agitation. Calm, cooperative, and communicative. Appropriate interactions and affect.. General Notes: Wound exam; small surgical open area on the medial aspect of his left ankle. This appears to have a clean wound base with healthy walls. 1.3 cm of depth is an improvement from last week. There is no evidence of circumferential infection. No debridement was required Integumentary (Hair,  Skin) Wound #1 status is Open. Original cause of wound was Gradually Appeared. The wound is located on the Left,Medial Malleolus. The wound measures 0.5cm length x 0.3cm width x 1.2cm depth; 0.118cm^2 area and 0.141cm^3 volume. There is Fat Layer (Subcutaneous Tissue) Exposed exposed. There is no tunneling noted, however, there is undermining starting at 1:00 and ending at 1:00 with a maximum distance of 0.7cm. There is a medium amount of serosanguineous drainage noted. The wound margin is distinct with the outline attached to the wound base. There is medium (34-66%) pink granulation within the wound bed. There is a medium (34-66%) amount of necrotic tissue within the wound bed including Adherent Slough. The  periwound skin appearance exhibited: Hemosiderin Staining, Mottled, Erythema. The periwound skin appearance did not exhibit: Callus, Crepitus, Excoriation, Induration, Rash, Scarring, Dry/Scaly, Maceration, Atrophie Blanche, Cyanosis, Ecchymosis, Pallor, Rubor. The surrounding wound skin color is noted with erythema which is circumferential. Periwound temperature was noted as No Abnormality. The periwound has tenderness on palpation. 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 Anesthetic: Wound #1 Left,Medial Malleolus: Topical Lidocaine 4% cream applied to wound bed prior to debridement Skin Barriers/Peri-Wound Care: Wound #1 Left,Medial Malleolus: Triamcinolone Acetonide Ointment Primary Wound Dressing: Wound #1  Left,Medial Malleolus: Prisma Ag Secondary Dressing: ABD pad Dressing Change Frequency: Wound #1 Left,Medial Malleolus: Change dressing every week Follow-up Appointments: Wound #1 Left,Medial Malleolus: Return Appointment in 1 week. Edema Control: Wound #1 Left,Medial Malleolus: 4-Layer Compression System - Left Lower Extremity Elevate legs to the level of the heart and pump ankles as often as possible Additional Orders / Instructions: Wound #1 Left,Medial Malleolus: Increase protein intake. Activity as tolerated General Notes: SNAP VAC not covered by patient's insurance, d/c NPWT order please We are going to use silver collagen packing. Jeffery Simon, Jeffery Simon ([id number]) ABD Continued 4-layer compression Some thoughts about a wound VAC on this area although it appears to be contracting without this so for now we will put this thought on hold Electronic Signature(s) Signed: 05/25/2016 4:08:16 PM By: Gretta Cool RN, BSN, Kim RN, BSN Signed: 05/26/2016 7:54:35 AM By: Linton Ham MD Previous Signature: 05/19/2016 7:39:10 AM Version By: Linton Ham MD Entered By: Gretta Cool, RN, BSN, Kim on 05/25/2016 16:08:16 Jeffery Simon ([id number]) -------------------------------------------------------------------------------- [place] Details Patient Name: Jeffery Simon, Jeffery Simon. Date of Service: 05/18/2016 Medical Record Number: [id number] Patient Account Number: [id number] Date of Birth/Sex: [birth year] (51 y.o. Male) Treating RN: Montey Hora Primary Care Provider: Greig Right Other Clinician: Referring Provider: Greig Right Treating Provider/Extender: Ricard Dillon Weeks in Treatment: 2 Diagnosis Coding ICD-10 Codes Code Description Disruption of external operation (surgical) wound, not elsewhere classified, subsequent T81.31XD encounter Chronic venous hypertension (idiopathic) with ulcer and inflammation of left lower I87.332 extremity L97.323 Non-pressure chronic ulcer of  left ankle with necrosis of muscle Facility Procedures CPT4: Description Modifier Quantity Code [id number] (Facility Use Only) [phone] - [facility] 1 LEG Physician Procedures CPT4: Description Modifier Quantity Code [id number] - WC PHYS LEVEL 2 - EST PT 1 ICD-10 Description Diagnosis T81.31XD Disruption of external operation (surgical) wound, not elsewhere classified, subsequent encounter I87.332 Chronic venous  hypertension (idiopathic) with ulcer and inflammation of left lower extremity Electronic Signature(s) Signed: 05/18/2016 2:40:00 PM By: Montey Hora Signed: 05/19/2016 7:39:10 AM By: Linton Ham MD Entered By: Montey Hora on 05/18/2016 14:39:59

## 2016-05-19 NOTE — Progress Notes (Signed)
GREIG, ALTERGOTT ([id number]) Visit Report for 05/18/2016 Arrival Information Details Patient Name: Jeffery Simon, Jeffery Simon. Date of Service: 05/18/2016 1:15 PM Medical Record Number: [id number] Patient Account Number: [id number] Date of Birth/Sex: December 26, 1965 (51 y.o. Male) Treating RN: Montey Hora Primary Care Cam Dauphin: Greig Right Other Clinician: Referring Marcial Pless: Greig Right Treating Arlin Sass/Extender: Tito Dine in Treatment: 2 Visit Information History Since Last Visit Added or deleted any medications: No Patient Arrived: Ambulatory Any new allergies or adverse reactions: No Arrival Time: 13:30 Had a fall or experienced change in No Accompanied By: self activities of daily living that may affect Transfer Assistance: None risk of falls: Patient Identification Verified: Yes Signs or symptoms of abuse/neglect since last No Secondary Verification Process Yes visito Completed: Hospitalized since last visit: No Patient Requires Transmission-Based No Has Dressing in Place as Prescribed: Yes Precautions: Has Compression in Place as Prescribed: Yes Patient Has Alerts: No Pain Present Now: No Electronic Signature(s) Signed: 05/18/2016 4:05:06 PM By: Montey Hora Entered By: Montey Hora on 05/18/2016 13:31:07 Jeffery Simon ([id number]) -------------------------------------------------------------------------------- Encounter Discharge Information Details Patient Name: Jeffery Simon. Date of Service: 05/18/2016 1:15 PM Medical Record Number: [id number] Patient Account Number: [id number] Date of Birth/Sex: 1965-11-17 (51 y.o. Male) Treating RN: Montey Hora Primary Care Kimo Bancroft: Greig Right Other Clinician: Referring Oddie Bottger: Greig Right Treating Wilbon Obenchain/Extender: Tito Dine in Treatment: 2 Encounter Discharge Information Items Discharge Pain Level: 0 Discharge Condition: Stable Ambulatory Status: Ambulatory Discharge  Destination: Home Transportation: Private Auto Accompanied By: self Schedule Follow-up Appointment: Yes Medication Reconciliation completed and provided to Patient/Care No Montoya Watkin: Provided on Clinical Summary of Care: 05/18/2016 Form Type Recipient Paper Patient HL Electronic Signature(s) Signed: 05/18/2016 2:05:20 PM By: Ruthine Dose Entered By: Ruthine Dose on 05/18/2016 14:05:20 Jeffery Simon ([id number]) -------------------------------------------------------------------------------- Lower Extremity Assessment Details Patient Name: Jeffery Simon. Date of Service: 05/18/2016 1:15 PM Medical Record Number: [id number] Patient Account Number: [id number] Date of Birth/Sex: [birth year] (51 y.o. Male) Treating RN: Montey Hora Primary Care Sahan Pen: Greig Right Other Clinician: Referring Jakylah Bassinger: Greig Right Treating Riku Buttery/Extender: Ricard Dillon Weeks in Treatment: 2 Edema Assessment Assessed: [Left: No] [Right: No] E[Left: dema] [Right: :] Calf Left: Right: Point of Measurement: 38 cm From Medial Instep 49.6 cm cm Ankle Left: Right: Point of Measurement: 12 cm From Medial Instep 29.5 cm cm Vascular Assessment Pulses: Dorsalis Pedis Palpable: [Left:Yes] Posterior Tibial Extremity colors, hair growth, and conditions: Extremity Color: [Left:Hyperpigmented] Hair Growth on Extremity: [Left:No] Temperature of Extremity: [Left:Warm] Capillary Refill: [Left:< 3 seconds] Electronic Signature(s) Signed: 05/18/2016 4:05:06 PM By: Montey Hora Entered By: Montey Hora on 05/18/2016 13:44:06 Jeffery Simon ([id number]) -------------------------------------------------------------------------------- Multi Wound Chart Details Patient Name: Jeffery Simon. Date of Service: 05/18/2016 1:15 PM Medical Record Number: [id number] Patient Account Number: [id number] Date of Birth/Sex: [birth year] (51 y.o. Male) Treating RN: Montey Hora Primary Care  Grey Schlauch: Greig Right Other Clinician: Referring Tishara Pizano: Greig Right Treating Jamael Hoffmann/Extender: Ricard Dillon Weeks in Treatment: 2 Vital Signs Height(in): 71 Pulse(bpm): 83 Weight(lbs): 389 Blood Pressure 129/60 (mmHg): Body Mass Index(BMI): 54 Temperature(F): Respiratory Rate 18 (breaths/min): Photos: [1:No Photos] [N/A:N/A] Wound Location: [1:Left Malleolus - Medial] [N/A:N/A] Wounding Event: [1:Gradually Appeared] [N/A:N/A] Primary Etiology: [1:Dehisced Wound] [N/A:N/A] Comorbid History: [1:Sleep Apnea, Hypertension] [N/A:N/A] Date Acquired: [1:04/25/2016] [N/A:N/A] Weeks of Treatment: [1:2] [N/A:N/A] Wound Status: [1:Open] [N/A:N/A] Measurements L x W x D 0.5x0.3x1.2 [N/A:N/A] (cm) Area (cm) : [1:0.118] [N/A:N/A] Volume (cm) : [1:0.141] [N/A:N/A] % Reduction in Area: [1:74.90%] [N/A:N/A] % Reduction in Volume: 75.00% [N/A:N/A] Starting  Position 1 1 (o'clock): Ending Position 1 [1:1] (o'clock): Maximum Distance 1 0.7 (cm): Undermining: [1:Yes] [N/A:N/A] Classification: [1:Full Thickness Without Exposed Support Structures] [N/A:N/A] Exudate Amount: [1:Medium] [N/A:N/A] Exudate Type: [1:Serosanguineous] [N/A:N/A] Exudate Color: [1:red, brown] [N/A:N/A] Wound Margin: [1:Distinct, outline attached] [N/A:N/A] Granulation Amount: [1:Medium (34-66%)] [N/A:N/A] Granulation Quality: Pink N/A N/A Necrotic Amount: Medium (34-66%) N/A N/A Exposed Structures: Fat Layer (Subcutaneous N/A N/A Tissue) Exposed: Yes Fascia: No Tendon: No Muscle: No Joint: No Bone: No Epithelialization: None N/A N/A Periwound Skin Texture: Excoriation: No N/A N/A Induration: No Callus: No Crepitus: No Rash: No Scarring: No Periwound Skin Maceration: No N/A N/A Moisture: Dry/Scaly: No Periwound Skin Color: Erythema: Yes N/A N/A Hemosiderin Staining: Yes Mottled: Yes Atrophie Blanche: No Cyanosis: No Ecchymosis: No Pallor: No Rubor: No Erythema Location:  Circumferential N/A N/A Temperature: No Abnormality N/A N/A Tenderness on Yes N/A N/A Palpation: Wound Preparation: Ulcer Cleansing: Other: N/A N/A soap and water Topical Anesthetic Applied: Other: lidocaine 4% Treatment Notes Electronic Signature(s) Signed: 05/19/2016 7:39:10 AM By: Linton Ham MD Entered By: Linton Ham on 05/18/2016 14:35:31 Jeffery Simon ([id number]) -------------------------------------------------------------------------------- [place] Details Patient Name: Jeffery Simon, Jeffery Simon. Date of Service: 05/18/2016 1:15 PM Medical Record Number: [id number] Patient Account Number: [id number] Date of Birth/Sex: 1966-01-23 (51 y.o. Male) Treating RN: Montey Hora Primary Care Dorma Altman: Greig Right Other Clinician: Referring Alexzander Dolinger: Greig Right Treating Raygan Skarda/Extender: Tito Dine in Treatment: 2 Active Inactive ` Orientation to the Wound Care Program Nursing Diagnoses: Knowledge deficit related to the wound healing center program Goals: Patient/caregiver will verbalize understanding of the [facility] Program Date Initiated: 05/04/2016 Target Resolution Date: 09/03/2016 Goal Status: Active Interventions: Provide education on orientation to the wound center Notes: ` Venous Leg Ulcer Nursing Diagnoses: Actual venous Insuffiency (use after diagnosis is confirmed) Potential for venous Insuffiency (use before diagnosis confirmed) Goals: Non-invasive venous studies are completed as ordered Date Initiated: 05/04/2016 Target Resolution Date: 09/03/2016 Goal Status: Active Patient will maintain optimal edema control Date Initiated: 05/04/2016 Target Resolution Date: 09/03/2016 Goal Status: Active Patient/caregiver will verbalize understanding of disease process and disease management Date Initiated: 05/04/2016 Target Resolution Date: 09/03/2016 Goal Status: Active Verify adequate tissue perfusion prior to  therapeutic compression application Date Initiated: 05/04/2016 Target Resolution Date: 09/03/2016 Goal Status: Active Jeffery Simon, Jeffery Simon. ([id number]) Interventions: Assess peripheral edema status every visit. Compression as ordered Provide education on venous insufficiency Treatment Activities: Therapeutic compression applied : 05/04/2016 Notes: ` Wound/Skin Impairment Nursing Diagnoses: Impaired tissue integrity Knowledge deficit related to ulceration/compromised skin integrity Goals: Patient/caregiver will verbalize understanding of skin care regimen Date Initiated: 05/04/2016 Target Resolution Date: 09/03/2016 Goal Status: Active Ulcer/skin breakdown will have a volume reduction of 30% by week 4 Date Initiated: 05/04/2016 Target Resolution Date: 09/03/2016 Goal Status: Active Ulcer/skin breakdown will have a volume reduction of 50% by week 8 Date Initiated: 05/04/2016 Target Resolution Date: 09/03/2016 Goal Status: Active Ulcer/skin breakdown will have a volume reduction of 80% by week 12 Date Initiated: 05/04/2016 Target Resolution Date: 09/03/2016 Goal Status: Active Ulcer/skin breakdown will heal within 14 weeks Date Initiated: 05/04/2016 Target Resolution Date: 09/03/2016 Goal Status: Active Interventions: Assess patient/caregiver ability to obtain necessary supplies Assess patient/caregiver ability to perform ulcer/skin care regimen upon admission and as needed Assess ulceration(s) every visit Provide education on ulcer and skin care Treatment Activities: Referred to DME Mehkai Gallo for dressing supplies : 05/04/2016 Skin care regimen initiated : 05/04/2016 Topical wound management initiated : 05/04/2016 Jeffery Simon, Jeffery Simon ([id number]) Notes: Electronic Signature(s) Signed: 05/18/2016 4:05:06  PM By: Montey Hora Entered By: Montey Hora on 05/18/2016 13:47:28 Jeffery Simon  ([id number]) -------------------------------------------------------------------------------- Pain Assessment Details Patient Name: Jeffery Simon. Date of Service: 05/18/2016 1:15 PM Medical Record Number: [id number] Patient Account Number: [id number] Date of Birth/Sex: 06-25-1965 (51 y.o. Male) Treating RN: Montey Hora Primary Care Jock Mahon: Greig Right Other Clinician: Referring Daielle Melcher: Greig Right Treating Brecken Dewoody/Extender: Ricard Dillon Weeks in Treatment: 2 Active Problems Location of Pain Severity and Description of Pain Patient Has Paino No Site Locations Pain Management and Medication Current Pain Management: Notes Topical or injectable lidocaine is offered to patient for acute pain when surgical debridement is performed. If needed, Patient is instructed to use over the counter pain medication for the following 24-48 hours after debridement. Wound care MDs do not prescribed pain medications. Patient has chronic pain or uncontrolled pain. Patient has been instructed to make an appointment with their Primary Care Physician for pain management. Electronic Signature(s) Signed: 05/18/2016 4:05:06 PM By: Montey Hora Entered By: Montey Hora on 05/18/2016 13:31:15 Jeffery Simon ([id number]) -------------------------------------------------------------------------------- Patient/Caregiver Education Details Patient Name: Jeffery Simon. Date of Service: 05/18/2016 1:15 PM Medical Record Number: [id number] Patient Account Number: [id number] Date of Birth/Gender: 12-21-1965 (51 y.o. Male) Treating RN: Montey Hora Primary Care Physician: Greig Right Other Clinician: Referring Physician: Greig Right Treating Physician/Extender: Tito Dine in Treatment: 2 Education Assessment Education Provided To: Patient Education Topics Provided Venous: Handouts: Other: wound carre and elevation as ordered Electronic Signature(s) Signed:  05/18/2016 4:05:06 PM By: Montey Hora Entered By: Montey Hora on 05/18/2016 13:48:35 Jeffery Simon ([id number]) -------------------------------------------------------------------------------- Wound Assessment Details Patient Name: Jeffery Simon. Date of Service: 05/18/2016 1:15 PM Medical Record Number: [id number] Patient Account Number: [id number] Date of Birth/Sex: 03/10/1965 (51 y.o. Male) Treating RN: Montey Hora Primary Care Harald Quevedo: Greig Right Other Clinician: Referring Ortencia Askari: Greig Right Treating Stefhanie Kachmar/Extender: Ricard Dillon Weeks in Treatment: 2 Wound Status Wound Number: 1 Primary Etiology: Dehisced Wound Wound Location: Left Malleolus - Medial Wound Status: Open Wounding Event: Gradually Appeared Comorbid History: Sleep Apnea, Hypertension Date Acquired: 04/25/2016 Weeks Of Treatment: 2 Clustered Wound: No Photos Photo Uploaded By: Montey Hora on 05/18/2016 15:05:43 Wound Measurements Length: (cm) 0.5 Width: (cm) 0.3 Depth: (cm) 1.2 Area: (cm) 0.118 Volume: (cm) 0.141 % Reduction in Area: 74.9% % Reduction in Volume: 75% Epithelialization: None Tunneling: No Undermining: Yes Starting Position (o'clock): 1 Ending Position (o'clock): 1 Maximum Distance: (cm) 0.7 Wound Description Full Thickness Without Exposed Classification: Support Structures Wound Margin: Distinct, outline attached Exudate Medium Amount: Exudate Type: Serosanguineous Exudate Color: red, brown FRANKY, REIER ([id number]) Foul Odor After Cleansing: No Slough/Fibrino Yes Wound Bed Granulation Amount: Medium (34-66%) Exposed Structure Granulation Quality: Pink Fascia Exposed: No Necrotic Amount: Medium (34-66%) Fat Layer (Subcutaneous Tissue) Exposed: Yes Necrotic Quality: Adherent Slough Tendon Exposed: No Muscle Exposed: No Joint Exposed: No Bone Exposed: No Periwound Skin Texture Texture Color No Abnormalities Noted: No No  Abnormalities Noted: No Callus: No Atrophie Blanche: No Crepitus: No Cyanosis: No Excoriation: No Ecchymosis: No Induration: No Erythema: Yes Rash: No Erythema Location: Circumferential Scarring: No Hemosiderin Staining: Yes Mottled: Yes Moisture Pallor: No No Abnormalities Noted: No Rubor: No Dry / Scaly: No Maceration: No Temperature / Pain Temperature: No Abnormality Tenderness on Palpation: Yes Wound Preparation Ulcer Cleansing: Other: soap and water, Topical Anesthetic Applied: Other: lidocaine 4%, Treatment Notes Wound #1 (Left, Medial Malleolus) 1. Cleansed with: Cleanse wound with antibacterial soap and water 2. Anesthetic Topical Lidocaine 4% cream to wound  bed prior to debridement 4. Dressing Applied: Prisma Ag Other dressing (specify in notes) 7. Secured with 4-Layer Compression System - Left Lower Extremity Notes xtrasorb Electronic Signature(s) Signed: 05/18/2016 4:05:06 PM By: Montey Hora Entered By: Montey Hora on 05/18/2016 13:40:10 Jeffery Simon ([id number]) Jeffery Simon ([id number]) -------------------------------------------------------------------------------- [place] Details Patient Name: Jeffery Simon. Date of Service: 05/18/2016 1:15 PM Medical Record Number: [id number] Patient Account Number: [id number] Date of Birth/Sex: [birth year] (51 y.o. Male) Treating RN: Montey Hora Primary Care Herberta Pickron: Greig Right Other Clinician: Referring Caswell Alvillar: Greig Right Treating Hillel Card/Extender: Ricard Dillon Weeks in Treatment: 2 Vital Signs Time Taken: 13:31 Pulse (bpm): 83 Height (in): 71 Respiratory Rate (breaths/min): 18 Weight (lbs): 389 Blood Pressure (mmHg): 129/60 Body Mass Index (BMI): 54.2 Reference Range: 80 - 120 mg / dl Electronic Signature(s) Signed: 05/18/2016 4:05:06 PM By: Montey Hora Entered By: Montey Hora on 05/18/2016 13:33:23

## 2016-05-25 ENCOUNTER — Encounter: Payer: BLUE CROSS/BLUE SHIELD | Attending: Internal Medicine | Admitting: Internal Medicine

## 2016-05-25 DIAGNOSIS — I87332 Chronic venous hypertension (idiopathic) with ulcer and inflammation of left lower extremity: Secondary | ICD-10-CM | POA: Insufficient documentation

## 2016-05-25 DIAGNOSIS — G4733 Obstructive sleep apnea (adult) (pediatric): Secondary | ICD-10-CM | POA: Diagnosis not present

## 2016-05-25 DIAGNOSIS — Z6841 Body Mass Index (BMI) 40.0 and over, adult: Secondary | ICD-10-CM | POA: Diagnosis not present

## 2016-05-25 DIAGNOSIS — Y839 Surgical procedure, unspecified as the cause of abnormal reaction of the patient, or of later complication, without mention of misadventure at the time of the procedure: Secondary | ICD-10-CM | POA: Diagnosis not present

## 2016-05-25 DIAGNOSIS — L97323 Non-pressure chronic ulcer of left ankle with necrosis of muscle: Secondary | ICD-10-CM | POA: Diagnosis not present

## 2016-05-25 DIAGNOSIS — T8131XD Disruption of external operation (surgical) wound, not elsewhere classified, subsequent encounter: Secondary | ICD-10-CM | POA: Insufficient documentation

## 2016-05-25 DIAGNOSIS — T8189XA Other complications of procedures, not elsewhere classified, initial encounter: Secondary | ICD-10-CM | POA: Diagnosis not present

## 2016-05-25 DIAGNOSIS — I10 Essential (primary) hypertension: Secondary | ICD-10-CM | POA: Insufficient documentation

## 2016-05-27 NOTE — Progress Notes (Signed)
Jeffery Simon, Jeffery Simon ([id number]) Visit Report for 05/25/2016 Chief Complaint Document Details Patient Name: Jeffery Simon, Jeffery Simon. Date of Service: 05/25/2016 1:30 PM Medical Record Number: [id number] Patient Account Number: [id number] Date of Birth/Sex: [birth year] (51 y.o. Male) Treating RN: Montey Hora Primary Care Provider: Greig Right Other Clinician: Referring Provider: Greig Right Treating Provider/Extender: Tito Dine in Treatment: 3 Information Obtained from: Patient Chief Complaint 05/04/16; Patient is here for review of a post surgical wound on the left medial ankle Electronic Signature(s) Signed: 05/26/2016 7:53:49 AM By: Linton Ham MD Entered By: Linton Ham on 05/25/2016 14:50:06 Jeffery Simon ([id number]) -------------------------------------------------------------------------------- HPI Details Patient Name: Jeffery Simon. Date of Service: 05/25/2016 1:30 PM Medical Record Number: [id number] Patient Account Number: [id number] Date of Birth/Sex: 1965-05-23 (51 y.o. Male) Treating RN: Montey Hora Primary Care Provider: Greig Right Other Clinician: Referring Provider: Greig Right Treating Provider/Extender: Ricard Dillon Weeks in Treatment: 3 History of Present Illness HPI Description: 05/04/16; this is a 51 year old nondiabetic non-smoking man who is here for review of a wound on the left medial ankle. He has severe hypertension, severe obstructive sleep apnea on CPAP and morbid obesity. Is a problem apparently began in December with small veins that ruptured causing copious amounts of bleeding. He followed with Dr. [name] of [facility] underwent laser ablation in the left leg in January and on the right leg later on. He tolerated both of these procedures well. However he was admitted from 04/24/16 through 04/27/16 with cellulitis involving the left ankle. CT scan showed circumferential severe edema with posterior medial ankle fluid  collection. He was given vancomycin and Rocephin and was seen by Dr. [name] and underwent an IandD of the area. Aspiration of the left ankle was negative. Culture showed MRSA and he was discharged on doxycycline which she is still taking and will remain on total next week. He was discharged with iodoform packing to the surgical wound and a Ace wrap that is being applied by his wife. He was treated with vancomycin in the hospital. The patient does not have a prior history of lower extremity wounds although he has chronic venous insufficiency with inflammation and secondary lymphedema. Echocardiogram done in the hospital showed an EF of 50-55%. Grade 2 diastolic dysfunction ABIs in this clinic were 1.03 on the right 0.9 on the left 05/11/16; patient arrives back in clinic today for review of a surgical area on the left medial ankle. He has completed his antibiotics there is no evidence of surrounding infection and no pain. We started silver alginate last week in place of iodoform packing. 05/18/16; the patient's wound is smaller in terms of the orifice and now as 1.3 cm depth versus 1.7 cm last week. He has completed his antibiotics. His insurance did not approve a Snap Vac. The wound is now too small for a standard [employer] electrical Vac. 05/25/16 continues to improve in term of depth. 0.7x0.3x0.8. Ddp down by 0.5cm. original surgical wound Electronic Signature(s) Signed: 05/26/2016 7:53:49 AM By: Linton Ham MD Entered By: Linton Ham on 05/25/2016 14:52:03 Jeffery Simon ([id number]) -------------------------------------------------------------------------------- Physical Exam Details Patient Name: Jeffery Simon. Date of Service: 05/25/2016 1:30 PM Medical Record Number: [id number] Patient Account Number: [id number] Date of Birth/Sex: Jul 01, 1965 (51 y.o. Male) Treating RN: Montey Hora Primary Care Provider: Greig Right Other Clinician: Referring Provider: Greig Right Treating Provider/Extender: Ricard Dillon Weeks in Treatment: 3 Constitutional Sitting or standing Blood Pressure is within target range for patient.. Pulse regular and within target range for  patient.. Eyes Conjunctivae clear. No discharge.Marland Kitchen Respiratory Respiratory effort is easy and symmetric bilaterally. Rate is normal at rest and on room air.Marland Kitchen Lymphatic none palpable in the left popliteal or inguinal area. Psychiatric No evidence of depression, anxiety, or agitation. Calm, cooperative, and communicative. Appropriate interactions and affect.. Notes wound exam; down 0.5 cm in depth. No evidence of ischemia or infection .What I can see of the base of the wouind appears healthy Electronic Signature(s) Signed: 05/26/2016 7:53:49 AM By: Linton Ham MD Entered By: Linton Ham on 05/25/2016 14:55:38 Jeffery Simon ([id number]) -------------------------------------------------------------------------------- Physician Orders Details Patient Name: Jeffery Simon. Date of Service: 05/25/2016 1:30 PM Medical Record Number: [id number] Patient Account Number: [id number] Date of Birth/Sex: 01-18-1966 (51 y.o. Male) Treating RN: Montey Hora Primary Care Provider: Greig Right Other Clinician: Referring Provider: Greig Right Treating Provider/Extender: Tito Dine in Treatment: 3 Verbal / Phone Orders: No Diagnosis Coding Wound Cleansing Wound #1 Left,Medial Malleolus o Cleanse wound with mild soap and water o May Shower, gently pat wound dry prior to applying new dressing. Anesthetic Wound #1 Left,Medial Malleolus o Topical Lidocaine 4% cream applied to wound bed prior to debridement Skin Barriers/Peri-Wound Care Wound #1 Left,Medial Malleolus o Triamcinolone Acetonide Ointment Primary Wound Dressing Wound #1 Left,Medial Malleolus o Prisma Ag Secondary Dressing o XtraSorb Dressing Change Frequency Wound #1 Left,Medial  Malleolus o Change dressing every week Follow-up Appointments Wound #1 Left,Medial Malleolus o Return Appointment in 1 week. Edema Control Wound #1 Left,Medial Malleolus o 4-Layer Compression System - Left Lower Extremity o Elevate legs to the level of the heart and pump ankles as often as possible Additional Orders / Instructions Wound #1 Left,Medial Malleolus Jeffery Simon, TAILOR. ([id number]) o Increase protein intake. o Activity as tolerated Electronic Signature(s) Signed: 05/25/2016 4:44:24 PM By: Montey Hora Signed: 05/26/2016 7:53:49 AM By: Linton Ham MD Entered By: Montey Hora on 05/25/2016 13:57:02 Jeffery Simon ([id number]) -------------------------------------------------------------------------------- Problem List Details Patient Name: Jeffery Simon, GRIMA. Date of Service: 05/25/2016 1:30 PM Medical Record Number: [id number] Patient Account Number: [id number] Date of Birth/Sex: Oct 15, 1965 (51 y.o. 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 Date of Service: 05/25/2016 1:30 PM Medical  Record Number: [id number] Patient Account Number: [id number] Date of Birth/Sex: July 17, 1965 (51 y.o. Male) Treating RN: Montey Hora Primary Care Provider: Greig Right Other Clinician: Referring Provider: Greig Right Treating Provider/Extender: Tito Dine in Treatment: 3 Subjective Chief Complaint Information obtained from Patient 05/04/16; Patient is here for review of a post surgical wound on the left medial ankle History of Present Illness (HPI) 05/04/16; this is a 51 year old nondiabetic non-smoking man who is here for review of a wound on the left medial ankle. He has severe hypertension, severe obstructive sleep apnea on CPAP and morbid obesity. Is a problem apparently began in December with small veins that ruptured causing copious amounts of bleeding. He followed with Dr. [name] of [facility] underwent  laser ablation in the left leg in January and on the right leg later on. He tolerated both of these procedures well. However he was admitted from 04/24/16 through 04/27/16 with cellulitis involving the left ankle. CT scan showed circumferential severe edema with posterior medial ankle fluid collection. He was given vancomycin and Rocephin and was seen by Dr. [name] and underwent an IandD of the area. Aspiration of the left ankle was negative. Culture showed MRSA and he was discharged on doxycycline which she is still taking and will remain on total next week. He was discharged with iodoform packing to the surgical wound and a Ace wrap that is being applied by his wife. He was treated with vancomycin in the hospital. The patient does not have a prior history of lower extremity wounds although he has chronic venous insufficiency with inflammation and secondary lymphedema. Echocardiogram done in the hospital showed an EF of 50-55%. Grade 2 diastolic dysfunction ABIs in this clinic were 1.03 on the right 0.9 on the left 05/11/16; patient arrives back in clinic today for review of  a surgical area on the left medial ankle. He has completed his antibiotics there is no evidence of surrounding infection and no pain. We started silver alginate last week in place of iodoform packing. 05/18/16; the patient's wound is smaller in terms of the orifice and now as 1.3 cm depth versus 1.7 cm last week. He has completed his antibiotics. His insurance did not approve a Snap Vac. The wound is now too small for a standard [employer] electrical Vac. 05/25/16 continues to improve in term of depth. 0.7x0.3x0.8. Ddp down by 0.5cm. original surgical wound Objective Jeffery Simon, BRASSELL. ([id number]) Constitutional Sitting or standing Blood Pressure is within target range for patient.. Pulse regular and within target range for patient.. Vitals Time Taken: 1:35 PM, Height: 71 in, Weight: 389 lbs, BMI: 54.2, Temperature: 98.3 F, Pulse: 72 bpm, Respiratory Rate: 18 breaths/min, Blood Pressure: 140/71 mmHg. Eyes Conjunctivae clear. No discharge.Marland Kitchen Respiratory Respiratory effort is easy and symmetric bilaterally. Rate is normal at rest and on room air.Marland Kitchen Lymphatic none palpable in the left popliteal or inguinal area. Psychiatric No evidence of depression, anxiety, or agitation. Calm, cooperative, and communicative. Appropriate interactions and affect.. General Notes: wound exam; down 0.5 cm in depth. No evidence of ischemia or infection .What I can see of the base of the wouind appears healthy Integumentary (Hair, Skin) Wound #1 status is Open. Original cause of wound was Gradually Appeared. The wound is located on the Left,Medial Malleolus. The wound measures 0.7cm length x 0.3cm width x 0.8cm depth; 0.165cm^2 area and 0.132cm^3 volume. There is Fat Layer (Subcutaneous Tissue) Exposed exposed. There is no tunneling or undermining noted. There is a medium amount of serosanguineous drainage noted. The wound margin is distinct with the outline attached to the wound base. There is medium (34-66%) pink  granulation within the wound bed. There is a medium (34-66%) amount of necrotic tissue within the wound bed including Adherent Slough. The periwound skin appearance exhibited: Hemosiderin Staining, Mottled, Erythema. The periwound skin appearance did not exhibit: Callus, Crepitus, Excoriation, Induration, Rash, Scarring, Dry/Scaly, Maceration, Atrophie Blanche, Cyanosis, Ecchymosis, Pallor, Rubor. The surrounding wound skin color is noted with erythema which is circumferential. Periwound temperature was noted as No Abnormality. The periwound has tenderness on palpation. 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 Anesthetic: Wound #1 Left,Medial Malleolus: Topical Lidocaine 4% cream applied to wound bed prior to debridement Skin Barriers/Peri-Wound Care: Wound #1 Left,Medial Malleolus: Triamcinolone Acetonide Ointment Primary Wound Dressing: Wound #1 Left,Medial Malleolus: Prisma Ag Secondary Dressing: XtraSorb Dressing Change Frequency: Wound #1 Left,Medial Malleolus: Change dressing every week Follow-up Appointments: Wound #1 Left,Medial Malleolus: Return Appointment in 1 week. Edema Control: Wound #1 Left,Medial Malleolus: 4-Layer Compression System - Left Lower Extremity Elevate legs to the level of the heart and pump ankles as often as possible Additional Orders / Instructions: Wound #1 Left,Medial Malleolus: Increase protein intake. Activity as tolerated doing well no change ot dressing AG collagne and  foam Jeffery Simon, Jeffery Simon ([id number]) Electronic Signature(s) Signed: 05/26/2016 7:53:49 AM By: Linton Ham MD Entered By: Linton Ham on 05/25/2016 14:56:19 Jeffery Simon ([id number]) -------------------------------------------------------------------------------- SuperBill Details Patient Name: Jeffery Simon. Date of Service: 05/25/2016 Medical Record Number: [id number] Patient Account Number: [id number] Date of Birth/Sex: Sep 27, 1965 (51 y.o. 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 Facility Procedures CPT4: Description Modifier Quantity Code [id number] (Facility Use Only) [phone] - [facility] LEG Physician Procedures CPT4: Description Modifier Quantity Code [id number] - WC PHYS LEVEL 3 - EST PT 1 ICD-10 Description Diagnosis T81.31XD Disruption of external operation (surgical) wound, not elsewhere classified, subsequent encounter I87.332 Chronic venous  hypertension (idiopathic) with ulcer and inflammation of left lower extremity Electronic Signature(s) Signed: 05/26/2016 7:53:49 AM By: Linton Ham MD Previous Signature: 05/25/2016 2:52:57 PM Version By: Montey Hora Entered By: Linton Ham on 05/25/2016 14:56:49

## 2016-05-27 NOTE — Progress Notes (Signed)
KOHL, POLINSKY ([id number]) Visit Report for 05/25/2016 Arrival Information Details Patient Name: Jeffery Simon, Jeffery Simon. Date of Service: 05/25/2016 1:30 PM Medical Record Number: [id number] Patient Account Number: [id number] Date of Birth/Sex: 12-31-1965 (51 y.o. Male) Treating RN: Montey Hora Primary Care Janasha Barkalow: Greig Right Other Clinician: Referring Malaisha Silliman: Greig Right Treating Malyia Moro/Extender: Tito Dine in Treatment: 3 Visit Information History Since Last Visit Added or deleted any medications: No Patient Arrived: Ambulatory Any new allergies or adverse reactions: No Arrival Time: 13:29 Had a fall or experienced change in No Accompanied By: spouse activities of daily living that may affect Transfer Assistance: None risk of falls: Patient Identification Verified: Yes Signs or symptoms of abuse/neglect since last No Secondary Verification Process Yes visito Completed: Hospitalized since last visit: No Patient Requires Transmission-Based No Has Dressing in Place as Prescribed: Yes Precautions: Has Compression in Place as Prescribed: Yes Patient Has Alerts: No Pain Present Now: No Electronic Signature(s) Signed: 05/25/2016 4:44:24 PM By: Montey Hora Entered By: Montey Hora on 05/25/2016 13:34:54 Jeffery Simon ([id number]) -------------------------------------------------------------------------------- Encounter Discharge Information Details Patient Name: Jeffery Simon. Date of Service: 05/25/2016 1:30 PM Medical Record Number: [id number] Patient Account Number: [id number] Date of Birth/Sex: 05/22/1965 (51 y.o. Male) Treating RN: Montey Hora Primary Care Britian Jentz: Greig Right Other Clinician: Referring Emmily Pellegrin: Greig Right Treating Dreya Buhrman/Extender: Tito Dine in Treatment: 3 Encounter Discharge Information Items Discharge Pain Level: 0 Discharge Condition: Stable Ambulatory Status: Ambulatory Discharge  Destination: Home Transportation: Private Auto Accompanied By: spouse Schedule Follow-up Appointment: Yes Medication Reconciliation completed and provided to Patient/Care No Jerriyah Louis: Provided on Clinical Summary of Care: 05/25/2016 Form Type Recipient Paper Patient HL Electronic Signature(s) Signed: 05/25/2016 2:26:50 PM By: Ruthine Dose Entered By: Ruthine Dose on 05/25/2016 14:26:50 Jeffery Simon ([id number]) -------------------------------------------------------------------------------- Lower Extremity Assessment Details Patient Name: Jeffery Simon. Date of Service: 05/25/2016 1:30 PM Medical Record Number: [id number] Patient Account Number: [id number] Date of Birth/Sex: 12-26-1965 (51 y.o. Male) Treating RN: Montey Hora Primary Care Haly Feher: Greig Right Other Clinician: Referring Niharika Savino: Greig Right Treating Stormey Wilborn/Extender: Ricard Dillon Weeks in Treatment: 3 Edema Assessment Assessed: [Left: No] [Right: No] E[Left: dema] [Right: :] Calf Left: Right: Point of Measurement: 38 cm From Medial Instep cm cm Ankle Left: Right: Point of Measurement: 12 cm From Medial Instep cm cm Vascular Assessment Pulses: Dorsalis Pedis Palpable: [Left:Yes] Posterior Tibial Extremity colors, hair growth, and conditions: Extremity Color: [Left:Hyperpigmented] Hair Growth on Extremity: [Left:Yes] Temperature of Extremity: [Left:Warm] Capillary Refill: [Left:< 3 seconds] Electronic Signature(s) Signed: 05/25/2016 4:44:24 PM By: Montey Hora Entered By: Montey Hora on 05/25/2016 13:55:14 Jeffery Simon ([id number]) -------------------------------------------------------------------------------- Multi Wound Chart Details Patient Name: Jeffery Simon. Date of Service: 05/25/2016 1:30 PM Medical Record Number: [id number] Patient Account Number: [id number] Date of Birth/Sex: [birth year] (51 y.o. Male) Treating RN: Montey Hora Primary Care Trease Bremner:  Greig Right Other Clinician: Referring Satchel Heidinger: Greig Right Treating Mileydi Milsap/Extender: Ricard Dillon Weeks in Treatment: 3 Vital Signs Height(in): 71 Pulse(bpm): 72 Weight(lbs): 389 Blood Pressure 140/71 (mmHg): Body Mass Index(BMI): 54 Temperature(F): 98.3 Respiratory Rate 18 (breaths/min): Photos: [N/A:N/A] Wound Location: Left Malleolus - Medial N/A N/A Wounding Event: Gradually Appeared N/A N/A Primary Etiology: Dehisced Wound N/A N/A Comorbid History: Sleep Apnea, N/A N/A Hypertension Date Acquired: 04/25/2016 N/A N/A Weeks of Treatment: 3 N/A N/A Wound Status: Open N/A N/A Measurements L x W x D 0.7x0.3x0.8 N/A N/A (cm) Area (cm) : 0.165 N/A N/A Volume (cm) : 0.132 N/A N/A % Reduction in Area: 65.00% N/A  N/A % Reduction in Volume: 76.60% N/A N/A Classification: Full Thickness Without N/A N/A Exposed Support Structures Exudate Amount: Medium N/A N/A Exudate Type: Serosanguineous N/A N/A Exudate Color: red, brown N/A N/A Wound Margin: Distinct, outline attached N/A N/A Granulation Amount: Medium (34-66%) N/A N/A Granulation Quality: Pink N/A N/A BRISTOL, SOY ([id number]) Necrotic Amount: Medium (34-66%) N/A N/A Exposed Structures: Fat Layer (Subcutaneous N/A N/A Tissue) Exposed: Yes Fascia: No Tendon: No Muscle: No Joint: No Bone: No Epithelialization: None N/A N/A Periwound Skin Texture: Excoriation: No N/A N/A Induration: No Callus: No Crepitus: No Rash: No Scarring: No Periwound Skin Maceration: No N/A N/A Moisture: Dry/Scaly: No Periwound Skin Color: Erythema: Yes N/A N/A Hemosiderin Staining: Yes Mottled: Yes Atrophie Blanche: No Cyanosis: No Ecchymosis: No Pallor: No Rubor: No Erythema Location: Circumferential N/A N/A Temperature: No Abnormality N/A N/A Tenderness on Yes N/A N/A Palpation: Wound Preparation: Ulcer Cleansing: Other: N/A N/A soap and water Topical Anesthetic Applied: Other:  lidocaine 4% Treatment Notes Electronic Signature(s) Signed: 05/26/2016 7:53:49 AM By: Linton Ham MD Entered By: Linton Ham on 05/25/2016 14:49:59 Jeffery Simon ([id number]) -------------------------------------------------------------------------------- [place] Details Patient Name: Jeffery Simon. Date of Service: 05/25/2016 1:30 PM Medical Record Number: [id number] Patient Account Number: [id number] Date of Birth/Sex: 1965-01-31 (51 y.o. Male) Treating RN: Montey Hora Primary Care Chidera Thivierge: Greig Right Other Clinician: Referring Braiden Presutti: Greig Right Treating Nayeli Calvert/Extender: Tito Dine in Treatment: 3 Active Inactive ` Orientation to the Wound Care Program Nursing Diagnoses: Knowledge deficit related to the wound healing center program Goals: Patient/caregiver will verbalize understanding of the [place] Program Date Initiated: 05/04/2016 Target Resolution Date: 09/03/2016 Goal Status: Active Interventions: Provide education on orientation to the wound center Notes: ` Venous Leg Ulcer Nursing Diagnoses: Actual venous Insuffiency (use after diagnosis is confirmed) Potential for venous Insuffiency (use before diagnosis confirmed) Goals: Non-invasive venous studies are completed as ordered Date Initiated: 05/04/2016 Target Resolution Date: 09/03/2016 Goal Status: Active Patient will maintain optimal edema control Date Initiated: 05/04/2016 Target Resolution Date: 09/03/2016 Goal Status: Active Patient/caregiver will verbalize understanding of disease process and disease management Date Initiated: 05/04/2016 Target Resolution Date: 09/03/2016 Goal Status: Active Verify adequate tissue perfusion prior to therapeutic compression application Date Initiated: 05/04/2016 Target Resolution Date: 09/03/2016 Goal Status: Active REESE, STOCKMAN. ([id number]) Interventions: Assess peripheral edema status every  visit. Compression as ordered Provide education on venous insufficiency Treatment Activities: Therapeutic compression applied : 05/04/2016 Notes: ` Wound/Skin Impairment Nursing Diagnoses: Impaired tissue integrity Knowledge deficit related to ulceration/compromised skin integrity Goals: Patient/caregiver will verbalize understanding of skin care regimen Date Initiated: 05/04/2016 Target Resolution Date: 09/03/2016 Goal Status: Active Ulcer/skin breakdown will have a volume reduction of 30% by week 4 Date Initiated: 05/04/2016 Target Resolution Date: 09/03/2016 Goal Status: Active Ulcer/skin breakdown will have a volume reduction of 50% by week 8 Date Initiated: 05/04/2016 Target Resolution Date: 09/03/2016 Goal Status: Active Ulcer/skin breakdown will have a volume reduction of 80% by week 12 Date Initiated: 05/04/2016 Target Resolution Date: 09/03/2016 Goal Status: Active Ulcer/skin breakdown will heal within 14 weeks Date Initiated: 05/04/2016 Target Resolution Date: 09/03/2016 Goal Status: Active Interventions: Assess patient/caregiver ability to obtain necessary supplies Assess patient/caregiver ability to perform ulcer/skin care regimen upon admission and as needed Assess ulceration(s) every visit Provide education on ulcer and skin care Treatment Activities: Referred to DME Anneke Cundy for dressing supplies : 05/04/2016 Skin care regimen initiated : 05/04/2016 Topical wound management initiated : 05/04/2016 DESTAN, FRANCHINI ([id number]) Notes: Electronic Signature(s) Signed: 05/25/2016  4:44:24 PM By: Montey Hora Entered By: Montey Hora on 05/25/2016 13:55:21 Jeffery Simon ([id number]) -------------------------------------------------------------------------------- Pain Assessment Details Patient Name: Jeffery Simon. Date of Service: 05/25/2016 1:30 PM Medical Record Number: [id number] Patient Account Number: [id number] Date of Birth/Sex: Oct 21, 1965 (51 y.o.  Male) Treating RN: Montey Hora Primary Care Victorya Hillman: Greig Right Other Clinician: Referring Cassey Bacigalupo: Greig Right Treating Jachelle Fluty/Extender: Ricard Dillon Weeks in Treatment: 3 Active Problems Location of Pain Severity and Description of Pain Patient Has Paino No Site Locations Pain Management and Medication Current Pain Management: Notes Topical or injectable lidocaine is offered to patient for acute pain when surgical debridement is performed. If needed, Patient is instructed to use over the counter pain medication for the following 24-48 hours after debridement. Wound care MDs do not prescribed pain medications. Patient has chronic pain or uncontrolled pain. Patient has been instructed to make an appointment with their Primary Care Physician for pain management. Electronic Signature(s) Signed: 05/25/2016 4:44:24 PM By: Montey Hora Entered By: Montey Hora on 05/25/2016 13:35:01 Jeffery Simon ([id number]) -------------------------------------------------------------------------------- Patient/Caregiver Education Details Patient Name: Jeffery Simon. Date of Service: 05/25/2016 1:30 PM Medical Record Number: [id number] Patient Account Number: [id number] Date of Birth/Gender: [birth year] (51 y.o. Male) Treating RN: Montey Hora Primary Care Physician: Greig Right Other Clinician: Referring Physician: Greig Right Treating Physician/Extender: Tito Dine in Treatment: 3 Education Assessment Education Provided To: Patient Education Topics Provided Venous: Handouts: Other: leg elevation Methods: Explain/Verbal Responses: State content correctly Electronic Signature(s) Signed: 05/25/2016 4:44:24 PM By: Montey Hora Entered By: Montey Hora on 05/25/2016 13:56:35 Jeffery Simon ([id number]) -------------------------------------------------------------------------------- Wound Assessment Details Patient Name: Jeffery Simon. Date of Service: 05/25/2016 1:30 PM Medical Record Number: [id number] Patient Account Number: [id number] Date of Birth/Sex: 1965-02-09 (51 y.o. Male) Treating RN: Montey Hora Primary Care Kjersten Ormiston: Greig Right Other Clinician: Referring Rozanna Cormany: Greig Right Treating Addley Ballinger/Extender: Ricard Dillon Weeks in Treatment: 3 Wound Status Wound Number: 1 Primary Etiology: Dehisced Wound Wound Location: Left Malleolus - Medial Wound Status: Open Wounding Event: Gradually Appeared Comorbid History: Sleep Apnea, Hypertension Date Acquired: 04/25/2016 Weeks Of Treatment: 3 Clustered Wound: No Photos Photo Uploaded By: Montey Hora on 05/25/2016 14:44:53 Wound Measurements Length: (cm) 0.7 Width: (cm) 0.3 Depth: (cm) 0.8 Area: (cm) 0.165 Volume: (cm) 0.132 % Reduction in Area: 65% % Reduction in Volume: 76.6% Epithelialization: None Tunneling: No Undermining: No Wound Description Full Thickness Without Exposed Classification: Support Structures Wound Margin: Distinct, outline attached Exudate Medium Amount: Exudate Type: Serosanguineous Exudate Color: red, brown Foul Odor After Cleansing: No Slough/Fibrino Yes Wound Bed Granulation Amount: Medium (34-66%) Exposed Structure Granulation Quality: Pink Fascia Exposed: No Necrotic Amount: Medium (34-66%) Fat Layer (Subcutaneous Tissue) Exposed: Yes RAYNE, LOISEAU ([id number]) Necrotic Quality: Adherent Slough Tendon Exposed: No Muscle Exposed: No Joint Exposed: No Bone Exposed: No Periwound Skin Texture Texture Color No Abnormalities Noted: No No Abnormalities Noted: No Callus: No Atrophie Blanche: No Crepitus: No Cyanosis: No Excoriation: No Ecchymosis: No Induration: No Erythema: Yes Rash: No Erythema Location: Circumferential Scarring: No Hemosiderin Staining: Yes Mottled: Yes Moisture Pallor: No No Abnormalities Noted: No Rubor: No Dry / Scaly: No Maceration: No Temperature /  Pain Temperature: No Abnormality Tenderness on Palpation: Yes Wound Preparation Ulcer Cleansing: Other: soap and water, Topical Anesthetic Applied: Other: lidocaine 4%, Treatment Notes Wound #1 (Left, Medial Malleolus) 1. Cleansed with: Cleanse wound with antibacterial soap and water 3. Peri-wound Care: Other peri-wound care (specify in notes) 4. Dressing Applied: Prisma Ag Other dressing (specify  in notes) 7. Secured with Tape 4-Layer Compression System - Left Lower Extremity Notes xtrasorb, TCA to periwound Electronic Signature(s) Signed: 05/25/2016 4:44:24 PM By: Montey Hora Entered By: Montey Hora on 05/25/2016 13:41:31 Jeffery Simon ([id number]) -------------------------------------------------------------------------------- [place] Details Patient Name: Jeffery Simon. Date of Service: 05/25/2016 1:30 PM Medical Record Number: [id number] Patient Account Number: [id number] Date of Birth/Sex: 1965/04/11 (50 y.o. Male) Treating RN: Montey Hora Primary Care Charels Stambaugh: Greig Right Other Clinician: Referring Danyell Shader: Greig Right Treating Elisandra Deshmukh/Extender: Ricard Dillon Weeks in Treatment: 3 Vital Signs Time Taken: 13:35 Temperature (F): 98.3 Height (in): 71 Pulse (bpm): 72 Weight (lbs): 389 Respiratory Rate (breaths/min): 18 Body Mass Index (BMI): 54.2 Blood Pressure (mmHg): 140/71 Reference Range: 80 - 120 mg / dl Electronic Signature(s) Signed: 05/25/2016 4:44:24 PM By: Montey Hora Entered By: Montey Hora on 05/25/2016 13:36:09

## 2016-06-01 ENCOUNTER — Encounter: Payer: BLUE CROSS/BLUE SHIELD | Admitting: Internal Medicine

## 2016-06-01 DIAGNOSIS — T8189XA Other complications of procedures, not elsewhere classified, initial encounter: Secondary | ICD-10-CM | POA: Diagnosis not present

## 2016-06-01 DIAGNOSIS — I10 Essential (primary) hypertension: Secondary | ICD-10-CM | POA: Diagnosis not present

## 2016-06-01 DIAGNOSIS — G4733 Obstructive sleep apnea (adult) (pediatric): Secondary | ICD-10-CM | POA: Diagnosis not present

## 2016-06-01 DIAGNOSIS — I87332 Chronic venous hypertension (idiopathic) with ulcer and inflammation of left lower extremity: Secondary | ICD-10-CM | POA: Diagnosis not present

## 2016-06-01 DIAGNOSIS — T8131XD Disruption of external operation (surgical) wound, not elsewhere classified, subsequent encounter: Secondary | ICD-10-CM | POA: Diagnosis not present

## 2016-06-01 DIAGNOSIS — L97323 Non-pressure chronic ulcer of left ankle with necrosis of muscle: Secondary | ICD-10-CM | POA: Diagnosis not present

## 2016-06-01 DIAGNOSIS — Z6841 Body Mass Index (BMI) 40.0 and over, adult: Secondary | ICD-10-CM | POA: Diagnosis not present

## 2016-06-03 NOTE — Progress Notes (Signed)
Jeffery Simon, Jeffery Simon ([id number]) Visit Report for 06/01/2016 Arrival Information Details Patient Name: Jeffery Simon, Jeffery Simon. Date of Service: 06/01/2016 10:15 AM Medical Record Number: [id number] Patient Account Number: [id number] Date of Birth/Sex: 1965/12/10 (51 y.o. Male) Treating RN: Montey Hora Primary Care Charod Slawinski: Greig Right Other Clinician: Referring Indria Bishara: Greig Right Treating Rock Sobol/Extender: Tito Dine in Treatment: 4 Visit Information History Since Last Visit Added or deleted any medications: No Patient Arrived: Cane Any new allergies or adverse reactions: No Arrival Time: 10:03 Had a fall or experienced change in No Accompanied By: self activities of daily living that may affect Transfer Assistance: None risk of falls: Patient Identification Verified: Yes Signs or symptoms of abuse/neglect since last No Secondary Verification Process Completed: Yes visito Patient Requires Transmission-Based No Hospitalized since last visit: No Precautions: Has Dressing in Place as Prescribed: Yes Patient Has Alerts: No Has Compression in Place as Prescribed: Yes Pain Present Now: No Electronic Signature(s) Signed: 06/01/2016 4:56:51 PM By: Montey Hora Entered By: Montey Hora on 06/01/2016 10:05:58 Jeffery Simon ([id number]) -------------------------------------------------------------------------------- Encounter Discharge Information Details Patient Name: Jeffery Simon. Date of Service: 06/01/2016 10:15 AM Medical Record Number: [id number] Patient Account Number: [id number] Date of Birth/Sex: 1965-10-19 (51 y.o. Male) Treating RN: Montey Hora Primary Care Irving Bloor: Greig Right Other Clinician: Referring Vyolet Sakuma: Greig Right Treating Guila Owensby/Extender: Tito Dine in Treatment: 4 Encounter Discharge Information Items Discharge Pain Level: 0 Discharge Condition: Stable Ambulatory Status: Ambulatory Discharge Destination:  Home Transportation: Private Auto Accompanied By: self Schedule Follow-up Appointment: Yes Medication Reconciliation completed and provided to Patient/Care No Frazier Balfour: Provided on Clinical Summary of Care: 06/01/2016 Form Type Recipient Paper Patient HL Electronic Signature(s) Signed: 06/01/2016 10:45:30 AM By: Ruthine Dose Previous Signature: 06/01/2016 10:23:38 AM Version By: Montey Hora Entered By: Ruthine Dose on 06/01/2016 10:45:30 Jeffery Simon ([id number]) -------------------------------------------------------------------------------- Lower Extremity Assessment Details Patient Name: Jeffery Simon. Date of Service: 06/01/2016 10:15 AM Medical Record Number: [id number] Patient Account Number: [id number] Date of Birth/Sex: April 26, 1965 (51 y.o. Male) Treating RN: Montey Hora Primary Care Selyna Klahn: Greig Right Other Clinician: Referring Adamary Savary: Greig Right Treating Zandyr Barnhill/Extender: Ricard Dillon Weeks in Treatment: 4 Edema Assessment Assessed: [Left: No] [Right: No] E[Left: dema] [Right: :] Calf Left: Right: Point of Measurement: 38 cm From Medial Instep cm cm Ankle Left: Right: Point of Measurement: 12 cm From Medial Instep cm cm Vascular Assessment Pulses: Dorsalis Pedis Palpable: [Left:Yes] Posterior Tibial Extremity colors, hair growth, and conditions: Extremity Color: [Left:Hyperpigmented] Hair Growth on Extremity: [Left:Yes] Temperature of Extremity: [Left:Warm] Capillary Refill: [Left:< 3 seconds] Electronic Signature(s) Signed: 06/01/2016 4:56:51 PM By: Montey Hora Entered By: Montey Hora on 06/01/2016 10:14:38 Jeffery Simon ([id number]) -------------------------------------------------------------------------------- Multi Wound Chart Details Patient Name: Jeffery Simon. Date of Service: 06/01/2016 10:15 AM Medical Record Number: [id number] Patient Account Number: [id number] Date of Birth/Sex: Jan 07, 1966 (51 y.o.  Male) Treating RN: Montey Hora Primary Care Derron Pipkins: Greig Right Other Clinician: Referring Jena Tegeler: Greig Right Treating Christos Mixson/Extender: Ricard Dillon Weeks in Treatment: 4 Vital Signs Height(in): 71 Pulse(bpm): 75 Weight(lbs): 389 Blood Pressure 130/66 (mmHg): Body Mass Index(BMI): 54 Temperature(F): 98.2 Respiratory Rate 18 (breaths/min): Photos: [1:No Photos] [N/A:N/A] Wound Location: [1:Left Malleolus - Medial] [N/A:N/A] Wounding Event: [1:Gradually Appeared] [N/A:N/A] Primary Etiology: [1:Dehisced Wound] [N/A:N/A] Comorbid History: [1:Sleep Apnea, Hypertension] [N/A:N/A] Date Acquired: [1:04/25/2016] [N/A:N/A] Weeks of Treatment: [1:4] [N/A:N/A] Wound Status: [1:Open] [N/A:N/A] Measurements L x W x D 0.7x0.3x0.6 [N/A:N/A] (cm) Area (cm) : [1:0.165] [N/A:N/A] Volume (cm) : [1:0.099] [N/A:N/A] % Reduction in Area: [1:65.00%] [  N/A:N/A] % Reduction in Volume: 82.50% [N/A:N/A] Classification: [1:Full Thickness Without Exposed Support Structures] [N/A:N/A] Exudate Amount: [1:Medium] [N/A:N/A] Exudate Type: [1:Serosanguineous] [N/A:N/A] Exudate Color: [1:red, brown] [N/A:N/A] Wound Margin: [1:Distinct, outline attached] [N/A:N/A] Granulation Amount: [1:Medium (34-66%)] [N/A:N/A] Granulation Quality: [1:Pink] [N/A:N/A] Necrotic Amount: [1:Medium (34-66%)] [N/A:N/A] Exposed Structures: [1:Fat Layer (Subcutaneous Tissue) Exposed: Yes Fascia: No Tendon: No Muscle: No] [N/A:N/A] Joint: No Bone: No Epithelialization: None N/A N/A Periwound Skin Texture: Excoriation: No N/A N/A Induration: No Callus: No Crepitus: No Rash: No Scarring: No Periwound Skin Maceration: No N/A N/A Moisture: Dry/Scaly: No Periwound Skin Color: Erythema: Yes N/A N/A Hemosiderin Staining: Yes Mottled: Yes Atrophie Blanche: No Cyanosis: No Ecchymosis: No Pallor: No Rubor: No Erythema Location: Circumferential N/A N/A Temperature: No Abnormality N/A N/A Tenderness on  Yes N/A N/A Palpation: Wound Preparation: Ulcer Cleansing: Other: N/A N/A soap and water Topical Anesthetic Applied: Other: lidocaine 4% Treatment Notes Wound #1 (Left, Medial Malleolus) 1. Cleansed with: Cleanse wound with antibacterial soap and water 4. Dressing Applied: Prisma Ag Other dressing (specify in notes) 7. Secured with 4-Layer Compression System - Left Lower Extremity Notes xtrasorb, TCA to periwound Electronic Signature(s) Signed: 06/02/2016 12:30:45 PM By: Linton Ham MD Previous Signature: 06/01/2016 10:23:19 AM Version By: Montey Hora Entered By: Linton Ham on 06/01/2016 11:02:08 Jeffery Simon ([id number]) Jeffery Simon, Jeffery Simon ([id number]) -------------------------------------------------------------------------------- [place] Details Patient Name: Jeffery Simon, Jeffery Simon. Date of Service: 06/01/2016 10:15 AM Medical Record Number: [id number] Patient Account Number: [id number] Date of Birth/Sex: December 21, 1965 (51 y.o. Male) Treating RN: Montey Hora Primary Care Jaynell Castagnola: Greig Right Other Clinician: Referring Machi Whittaker: Greig Right Treating Sukari Grist/Extender: Tito Dine in Treatment: 4 Active Inactive ` Orientation to the Wound Care Program Nursing Diagnoses: Knowledge deficit related to the wound healing center program Goals: Patient/caregiver will verbalize understanding of the [place] Program Date Initiated: 05/04/2016 Target Resolution Date: 09/03/2016 Goal Status: Active Interventions: Provide education on orientation to the wound center Notes: ` Venous Leg Ulcer Nursing Diagnoses: Actual venous Insuffiency (use after diagnosis is confirmed) Potential for venous Insuffiency (use before diagnosis confirmed) Goals: Non-invasive venous studies are completed as ordered Date Initiated: 05/04/2016 Target Resolution Date: 09/03/2016 Goal Status: Active Patient will maintain optimal edema  control Date Initiated: 05/04/2016 Target Resolution Date: 09/03/2016 Goal Status: Active Patient/caregiver will verbalize understanding of disease process and disease management Date Initiated: 05/04/2016 Target Resolution Date: 09/03/2016 Goal Status: Active Verify adequate tissue perfusion prior to therapeutic compression application Date Initiated: 05/04/2016 Target Resolution Date: 09/03/2016 Goal Status: Active Jeffery Simon, Jeffery Simon. ([id number]) Interventions: Assess peripheral edema status every visit. Compression as ordered Provide education on venous insufficiency Treatment Activities: Therapeutic compression applied : 05/04/2016 Notes: ` Wound/Skin Impairment Nursing Diagnoses: Impaired tissue integrity Knowledge deficit related to ulceration/compromised skin integrity Goals: Patient/caregiver will verbalize understanding of skin care regimen Date Initiated: 05/04/2016 Target Resolution Date: 09/03/2016 Goal Status: Active Ulcer/skin breakdown will have a volume reduction of 30% by week 4 Date Initiated: 05/04/2016 Target Resolution Date: 09/03/2016 Goal Status: Active Ulcer/skin breakdown will have a volume reduction of 50% by week 8 Date Initiated: 05/04/2016 Target Resolution Date: 09/03/2016 Goal Status: Active Ulcer/skin breakdown will have a volume reduction of 80% by week 12 Date Initiated: 05/04/2016 Target Resolution Date: 09/03/2016 Goal Status: Active Ulcer/skin breakdown will heal within 14 weeks Date Initiated: 05/04/2016 Target Resolution Date: 09/03/2016 Goal Status: Active Interventions: Assess patient/caregiver ability to obtain necessary supplies Assess patient/caregiver ability to perform ulcer/skin care regimen upon admission and as needed Assess ulceration(s) every visit  Provide education on ulcer and skin care Treatment Activities: Referred to DME Leylanie Woodmansee for dressing supplies : 05/04/2016 Skin care regimen initiated : 05/04/2016 Topical wound  management initiated : 05/04/2016 Jeffery Simon, Jeffery Simon ([id number]) Notes: Electronic Signature(s) Signed: 06/01/2016 10:23:04 AM By: Montey Hora Entered By: Montey Hora on 06/01/2016 10:23:02 Jeffery Simon ([id number]) -------------------------------------------------------------------------------- Pain Assessment Details Patient Name: Jeffery Simon. Date of Service: 06/01/2016 10:15 AM Medical Record Number: [id number] Patient Account Number: [id number] Date of Birth/Sex: 28-Jul-1965 (51 y.o. Male) Treating RN: Montey Hora Primary Care Keyundra Fant: Greig Right Other Clinician: Referring Lavoris Canizales: Greig Right Treating Jacci Ruberg/Extender: Ricard Dillon Weeks in Treatment: 4 Active Problems Location of Pain Severity and Description of Pain Patient Has Paino No Site Locations Pain Management and Medication Current Pain Management: Notes Topical or injectable lidocaine is offered to patient for acute pain when surgical debridement is performed. If needed, Patient is instructed to use over the counter pain medication for the following 24-48 hours after debridement. Wound care MDs do not prescribed pain medications. Patient has chronic pain or uncontrolled pain. Patient has been instructed to make an appointment with their Primary Care Physician for pain management. Electronic Signature(s) Signed: 06/01/2016 4:56:51 PM By: Montey Hora Entered By: Montey Hora on 06/01/2016 10:13:01 Jeffery Simon ([id number]) -------------------------------------------------------------------------------- Patient/Caregiver Education Details Patient Name: Jeffery Simon. Date of Service: 06/01/2016 10:15 AM Medical Record Number: [id number] Patient Account Number: [id number] Date of Birth/Gender: 30-Jun-1965 (51 y.o. Male) Treating RN: Montey Hora Primary Care Physician: Greig Right Other Clinician: Referring Physician: Greig Right Treating Physician/Extender: Tito Dine in Treatment: 4 Education Assessment Education Provided To: Patient Education Topics Provided Basic Hygiene: Handouts: Other: wash your toes Methods: Explain/Verbal Responses: State content correctly Electronic Signature(s) Signed: 06/01/2016 4:56:51 PM By: Montey Hora Entered By: Montey Hora on 06/01/2016 10:23:57 Jeffery Simon ([id number]) -------------------------------------------------------------------------------- Wound Assessment Details Patient Name: Jeffery Simon. Date of Service: 06/01/2016 10:15 AM Medical Record Number: [id number] Patient Account Number: [id number] Date of Birth/Sex: 14-Oct-1965 (51 y.o. Male) Treating RN: Montey Hora Primary Care Jehiel Koepp: Greig Right Other Clinician: Referring Roman Dubuc: Greig Right Treating Mansur Patti/Extender: Ricard Dillon Weeks in Treatment: 4 Wound Status Wound Number: 1 Primary Etiology: Dehisced Wound Wound Location: Left Malleolus - Medial Wound Status: Open Wounding Event: Gradually Appeared Comorbid History: Sleep Apnea, Hypertension Date Acquired: 04/25/2016 Weeks Of Treatment: 4 Clustered Wound: No Photos Photo Uploaded By: Montey Hora on 06/01/2016 16:53:06 Wound Measurements Length: (cm) 0.7 Width: (cm) 0.3 Depth: (cm) 0.6 Area: (cm) 0.165 Volume: (cm) 0.099 % Reduction in Area: 65% % Reduction in Volume: 82.5% Epithelialization: None Tunneling: No Undermining: No Wound Description Full Thickness Without Exposed Classification: Support Structures Wound Margin: Distinct, outline attached Exudate Medium Amount: Exudate Type: Serosanguineous Exudate Color: red, brown Foul Odor After Cleansing: No Slough/Fibrino Yes Wound Bed Granulation Amount: Medium (34-66%) Exposed Structure Granulation Quality: Pink Fascia Exposed: No Necrotic Amount: Medium (34-66%) Fat Layer (Subcutaneous Tissue) Exposed: Yes Jeffery Simon, Jeffery Simon ([id number]) Necrotic Quality:  Adherent Slough Tendon Exposed: No Muscle Exposed: No Joint Exposed: No Bone Exposed: No Periwound Skin Texture Texture Color No Abnormalities Noted: No No Abnormalities Noted: No Callus: No Atrophie Blanche: No Crepitus: No Cyanosis: No Excoriation: No Ecchymosis: No Induration: No Erythema: Yes Rash: No Erythema Location: Circumferential Scarring: No Hemosiderin Staining: Yes Mottled: Yes Moisture Pallor: No No Abnormalities Noted: No Rubor: No Dry / Scaly: No Maceration: No Temperature / Pain Temperature: No Abnormality Tenderness on Palpation: Yes Wound Preparation Ulcer Cleansing: Other: soap and  water, Topical Anesthetic Applied: Other: lidocaine 4%, Treatment Notes Wound #1 (Left, Medial Malleolus) 1. Cleansed with: Cleanse wound with antibacterial soap and water 4. Dressing Applied: Prisma Ag Other dressing (specify in notes) 7. Secured with 4-Layer Compression System - Left Lower Extremity Notes xtrasorb, TCA to periwound Electronic Signature(s) Signed: 06/01/2016 4:56:51 PM By: Montey Hora Entered By: Montey Hora on 06/01/2016 10:14:20 Jeffery Simon ([id number]) -------------------------------------------------------------------------------- [place] Details Patient Name: Jeffery Simon. Date of Service: 06/01/2016 10:15 AM Medical Record Number: [id number] Patient Account Number: [id number] Date of Birth/Sex: 10-20-1965 (51 y.o. Male) Treating RN: Montey Hora Primary Care Sarah Zerby: Greig Right Other Clinician: Referring Vyron Fronczak: Greig Right Treating Rasheedah Reis/Extender: Tito Dine in Treatment: 4 Vital Signs Time Taken: 10:13 Temperature (F): 98.2 Height (in): 71 Pulse (bpm): 75 Weight (lbs): 389 Respiratory Rate (breaths/min): 18 Body Mass Index (BMI): 54.2 Blood Pressure (mmHg): 130/66 Reference Range: 80 - 120 mg / dl Electronic Signature(s) Signed: 06/01/2016 4:56:51 PM By: Montey Hora Entered By:  Montey Hora on 06/01/2016 10:13:24

## 2016-06-03 NOTE — Progress Notes (Signed)
ANN, GROENEVELD ([id number]) Visit Report for 06/01/2016 Chief Complaint Document Details Patient Name: Jeffery Simon, Jeffery Simon. Date of Service: 06/01/2016 10:15 AM Medical Record Number: [id number] Patient Account Number: [id number] Date of Birth/Sex: [birth year] (51 y.o. Male) Treating RN: Montey Hora Primary Care Provider: Greig Right Other Clinician: Referring Provider: Greig Right Treating Provider/Extender: Tito Dine in Treatment: 4 Information Obtained from: Patient Chief Complaint 05/04/16; Patient is here for review of a post surgical wound on the left medial ankle Electronic Signature(s) Signed: 06/02/2016 12:30:45 PM By: Linton Ham MD Entered By: Linton Ham on 06/01/2016 11:02:18 Jeffery Simon ([id number]) -------------------------------------------------------------------------------- HPI Details Patient Name: Jeffery Simon. Date of Service: 06/01/2016 10:15 AM Medical Record Number: [id number] Patient Account Number: [id number] Date of Birth/Sex: 1965/04/02 (51 y.o. Male) Treating RN: Montey Hora Primary Care Provider: Greig Right Other Clinician: Referring Provider: Greig Right Treating Provider/Extender: Ricard Dillon Weeks in Treatment: 4 History of Present Illness HPI Description: 05/04/16; this is a 51 year old nondiabetic non-smoking man who is here for review of a wound on the left medial ankle. He has severe hypertension, severe obstructive sleep apnea on CPAP and morbid obesity. Is a problem apparently began in December with small veins that ruptured causing copious amounts of bleeding. He followed with Dr. [name] of [facility] underwent laser ablation in the left leg in January and on the right leg later on. He tolerated both of these procedures well. However he was admitted from 04/24/16 through 04/27/16 with cellulitis involving the left ankle. CT scan showed circumferential severe edema with posterior medial ankle fluid  collection. He was given vancomycin and Rocephin and was seen by Dr. [name] and underwent an IandD of the area. Aspiration of the left ankle was negative. Culture showed MRSA and he was discharged on doxycycline which she is still taking and will remain on total next week. He was discharged with iodoform packing to the surgical wound and a Ace wrap that is being applied by his wife. He was treated with vancomycin in the hospital. The patient does not have a prior history of lower extremity wounds although he has chronic venous insufficiency with inflammation and secondary lymphedema. Echocardiogram done in the hospital showed an EF of 50-55%. Grade 2 diastolic dysfunction ABIs in this clinic were 1.03 on the right 0.9 on the left 05/11/16; patient arrives back in clinic today for review of a surgical area on the left medial ankle. He has completed his antibiotics there is no evidence of surrounding infection and no pain. We started silver alginate last week in place of iodoform packing. 05/18/16; the patient's wound is smaller in terms of the orifice and now as 1.3 cm depth versus 1.7 cm last week. He has completed his antibiotics. His insurance did not approve a Snap Vac. The wound is now too small for a standard [employer] electrical Vac. 05/25/16 continues to improve in term of depth. 0.7x0.3x0.8. Ddp down by 0.5cm. original surgical wound 06/01/16; measuring slightly smaller. 0.7x0.3x0.6. still using collagen Electronic Signature(s) Signed: 06/02/2016 12:30:45 PM By: Linton Ham MD Entered By: Linton Ham on 06/01/2016 11:03:15 Jeffery Simon ([id number]) -------------------------------------------------------------------------------- Physical Exam Details Patient Name: Jeffery Simon. Date of Service: 06/01/2016 10:15 AM Medical Record Number: [id number] Patient Account Number: [id number] Date of Birth/Sex: 1965-04-25 (51 y.o. Male) Treating RN: Montey Hora Primary Care Provider:  Greig Right Other Clinician: Referring Provider: Greig Right Treating Provider/Extender: Ricard Dillon Weeks in Treatment: 4 Constitutional Sitting or standing Blood Pressure is within target range for  patient.. Pulse regular and within target range for patient.Marland Kitchen Respirations regular, non-labored and within target range.. Temperature is normal and within the target range for the patient.. Patient's appearance is neat and clean. Appears in no acute distress. Well nourished and well developed.. Cardiovascular Pedal pulses palpable and strong bilaterally.. Notes wound exam: down 0.2 cm. base is healthy. Electronic Signature(s) Signed: 06/02/2016 12:30:45 PM By: Linton Ham MD Entered By: Linton Ham on 06/01/2016 11:05:13 Jeffery Simon ([id number]) -------------------------------------------------------------------------------- Physician Orders Details Patient Name: Jeffery Simon. Date of Service: 06/01/2016 10:15 AM Medical Record Number: [id number] Patient Account Number: [id number] Date of Birth/Sex: [birth year] (51 y.o. Male) Treating RN: Montey Hora Primary Care Provider: Greig Right Other Clinician: Referring Provider: Greig Right Treating Provider/Extender: Tito Dine in Treatment: 4 Verbal / Phone Orders: No Diagnosis Coding Wound Cleansing Wound #1 Left,Medial Malleolus o Cleanse wound with mild soap and water o May Shower, gently pat wound dry prior to applying new dressing. Anesthetic Wound #1 Left,Medial Malleolus o Topical Lidocaine 4% cream applied to wound bed prior to debridement Skin Barriers/Peri-Wound Care Wound #1 Left,Medial Malleolus o Triamcinolone Acetonide Ointment Primary Wound Dressing Wound #1 Left,Medial Malleolus o Prisma Ag Secondary Dressing Wound #1 Left,Medial Malleolus o XtraSorb Dressing Change Frequency Wound #1 Left,Medial Malleolus o Change dressing every week Follow-up  Appointments Wound #1 Left,Medial Malleolus o Return Appointment in 1 week. Edema Control Wound #1 Left,Medial Malleolus o 4-Layer Compression System - Left Lower Extremity o Elevate legs to the level of the heart and pump ankles as often as possible Barmore, Taedyn M. ([id number]) Additional Orders / Instructions Wound #1 Left,Medial Malleolus o Increase protein intake. o Activity as tolerated Electronic Signature(s) Signed: 06/01/2016 4:56:51 PM By: Montey Hora Signed: 06/02/2016 12:30:45 PM By: Linton Ham MD Entered By: Montey Hora on 06/01/2016 10:28:03 Jeffery Simon ([id number]) -------------------------------------------------------------------------------- Problem List Details Patient Name: NIHAAL, FRIESEN. Date of Service: 06/01/2016 10:15 AM Medical Record Number: [id number] Patient Account Number: [id number] Date of Birth/Sex: March 12, 1965 (51 y.o. 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 Date of Service: 06/01/2016 10:15 AM Medical Record Number: [id number] Patient Account Number:  [id number] Date of Birth/Sex: [birth year] (51 y.o. Male) Treating RN: Montey Hora Primary Care Provider: Greig Right Other Clinician: Referring Provider: Greig Right Treating Provider/Extender: Tito Dine in Treatment: 4 Subjective Chief Complaint Information obtained from Patient 05/04/16; Patient is here for review of a post surgical wound on the left medial ankle History of Present Illness (HPI) 05/04/16; this is a 51 year old nondiabetic non-smoking man who is here for review of a wound on the left medial ankle. He has severe hypertension, severe obstructive sleep apnea on CPAP and morbid obesity. Is a problem apparently began in December with small veins that ruptured causing copious amounts of bleeding. He followed with Dr. [name] of [facility] underwent laser ablation in the left leg in January and on the right  leg later on. He tolerated both of these procedures well. However he was admitted from 04/24/16 through 04/27/16 with cellulitis involving the left ankle. CT scan showed circumferential severe edema with posterior medial ankle fluid collection. He was given vancomycin and Rocephin and was seen by Dr. [name] and underwent an IandD of the area. Aspiration of the left ankle was negative. Culture showed MRSA and he was discharged on doxycycline which she is still taking and will remain on total next week. He was discharged with iodoform packing to the surgical wound and a Ace wrap that is being applied by his wife. He was treated with vancomycin in the hospital. The patient does not have a prior history of lower extremity wounds although he has chronic venous insufficiency with inflammation and secondary lymphedema. Echocardiogram done in the hospital showed an EF of 50-55%. Grade 2 diastolic dysfunction ABIs in this clinic were 1.03 on the right 0.9 on the left 05/11/16; patient arrives back in clinic today for review of a surgical area on the left medial ankle. He  has completed his antibiotics there is no evidence of surrounding infection and no pain. We started silver alginate last week in place of iodoform packing. 05/18/16; the patient's wound is smaller in terms of the orifice and now as 1.3 cm depth versus 1.7 cm last week. He has completed his antibiotics. His insurance did not approve a Snap Vac. The wound is now too small for a standard [employer] electrical Vac. 05/25/16 continues to improve in term of depth. 0.7x0.3x0.8. Ddp down by 0.5cm. original surgical wound 06/01/16; measuring slightly smaller. 0.7x0.3x0.6. still using collagen Objective COSTA, JHA. ([id number]) Constitutional Sitting or standing Blood Pressure is within target range for patient.. Pulse regular and within target range for patient.Marland Kitchen Respirations regular, non-labored and within target range.. Temperature is normal and within the target range for the patient.. Patient's appearance is neat and clean. Appears in no acute distress. Well nourished and well developed.. Vitals Time Taken: 10:13 AM, Height: 71 in, Weight: 389 lbs, BMI: 54.2, Temperature: 98.2 F, Pulse: 75 bpm, Respiratory Rate: 18 breaths/min, Blood Pressure: 130/66 mmHg. Cardiovascular Pedal pulses palpable and strong bilaterally.. General Notes: wound exam: down 0.2 cm. base is healthy. Integumentary (Hair, Skin) Wound #1 status is Open. Original cause of wound was Gradually Appeared. The wound is located on the Left,Medial Malleolus. The wound measures 0.7cm length x 0.3cm width x 0.6cm depth; 0.165cm^2 area and 0.099cm^3 volume. There is Fat Layer (Subcutaneous Tissue) Exposed exposed. There is no tunneling or undermining noted. There is a medium amount of serosanguineous drainage noted. The wound margin is distinct with the outline attached to the wound base. There is medium (34-66%) pink granulation within the wound bed. There is a medium (34-66%) amount of necrotic tissue within the wound bed  including Adherent Slough. The periwound skin appearance exhibited: Hemosiderin Staining, Mottled, Erythema. The periwound skin appearance did not exhibit: Callus, Crepitus, Excoriation, Induration, Rash, Scarring, Dry/Scaly, Maceration, Atrophie Blanche, Cyanosis, Ecchymosis, Pallor, Rubor. The surrounding wound skin color is noted with erythema which is circumferential. Periwound temperature was noted as No Abnormality. The periwound has tenderness on palpation. 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 ([id number]) Wound Cleansing: Wound #1 Left,Medial Malleolus: Cleanse wound with mild soap and water  May Shower, gently pat wound dry prior to applying new dressing. Anesthetic: Wound #1 Left,Medial Malleolus: Topical Lidocaine 4% cream applied to wound bed prior to debridement Skin Barriers/Peri-Wound Care: Wound #1 Left,Medial Malleolus: Triamcinolone Acetonide Ointment Primary Wound Dressing: Wound #1 Left,Medial Malleolus: Prisma Ag Secondary Dressing: Wound #1 Left,Medial Malleolus: XtraSorb Dressing Change Frequency: Wound #1 Left,Medial Malleolus: Change dressing every week Follow-up Appointments: Wound #1 Left,Medial Malleolus: Return Appointment in 1 week. Edema Control: Wound #1 Left,Medial Malleolus: 4-Layer Compression System - Left Lower Extremity Elevate legs to the level of the heart and pump ankles as often as possible Additional Orders / Instructions: Wound #1 Left,Medial Malleolus: Increase protein intake. Activity as tolerated still prisma Patient is travelling this week consider Human resources officer) Signed: 06/02/2016 12:30:45 PM By: Linton Ham MD Entered By: Linton Ham on 06/01/2016 11:05:44 Jeffery Simon ([id number]) -------------------------------------------------------------------------------- [place] Details Patient Name: Jeffery Simon. Date of Service: 06/01/2016 Medical Record Number: [id number] Patient Account Number: [id number] Date of Birth/Sex: May 27, 1965 (51 y.o. 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 Facility Procedures CPT4: Description Modifier Quantity Code [id number] (Facility Use Only) [phone] - [facility] Physician Procedures CPT4: Description Modifier Quantity Code [id number] - WC PHYS LEVEL 3 - EST PT 1 ICD-10 Description Diagnosis T81.31XD Disruption of external operation (surgical) wound, not elsewhere classified, subsequent encounter I87.332 Chronic venous  hypertension (idiopathic) with ulcer and inflammation of left lower extremity 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

## 2016-06-08 ENCOUNTER — Encounter: Payer: BLUE CROSS/BLUE SHIELD | Admitting: Internal Medicine

## 2016-06-08 DIAGNOSIS — Z6841 Body Mass Index (BMI) 40.0 and over, adult: Secondary | ICD-10-CM | POA: Diagnosis not present

## 2016-06-08 DIAGNOSIS — I10 Essential (primary) hypertension: Secondary | ICD-10-CM | POA: Diagnosis not present

## 2016-06-08 DIAGNOSIS — T8131XD Disruption of external operation (surgical) wound, not elsewhere classified, subsequent encounter: Secondary | ICD-10-CM | POA: Diagnosis not present

## 2016-06-08 DIAGNOSIS — G4733 Obstructive sleep apnea (adult) (pediatric): Secondary | ICD-10-CM | POA: Diagnosis not present

## 2016-06-08 DIAGNOSIS — I87332 Chronic venous hypertension (idiopathic) with ulcer and inflammation of left lower extremity: Secondary | ICD-10-CM | POA: Diagnosis not present

## 2016-06-08 DIAGNOSIS — L97323 Non-pressure chronic ulcer of left ankle with necrosis of muscle: Secondary | ICD-10-CM | POA: Diagnosis not present

## 2016-06-08 DIAGNOSIS — T8189XA Other complications of procedures, not elsewhere classified, initial encounter: Secondary | ICD-10-CM | POA: Diagnosis not present

## 2016-06-09 NOTE — Progress Notes (Signed)
SIRIS, HOOS ([id number]) Visit Report for 06/08/2016 Chief Complaint Document Details Patient Name: Jeffery Simon, Jeffery Simon. Date of Service: 06/08/2016 12:30 PM Medical Record Number: [id number] Patient Account Number: [id number] Date of Birth/Sex: [birth year] (51 y.o. Male) Treating RN: Montey Hora Primary Care Provider: Greig Right Other Clinician: Referring Provider: Greig Right Treating Provider/Extender: Ricard Dillon Weeks in Treatment: 5 Information Obtained from: Patient Chief Complaint 05/04/16; Patient is here for review of a post surgical wound on the left medial ankle Electronic Signature(s) Signed: 06/08/2016 4:47:06 PM By: Linton Ham MD Entered By: Linton Ham on 06/08/2016 14:43:17 Jeffery Simon ([id number]) -------------------------------------------------------------------------------- HPI Details Patient Name: Jeffery Simon. Date of Service: 06/08/2016 12:30 PM Medical Record Number: [id number] Patient Account Number: [id number] Date of Birth/Sex: Dec 24, 1965 (51 y.o. Male) Treating RN: Montey Hora Primary Care Provider: Greig Right Other Clinician: Referring Provider: Greig Right Treating Provider/Extender: Ricard Dillon Weeks in Treatment: 5 History of Present Illness HPI Description: 05/04/16; this is a 51 year old nondiabetic non-smoking man who is here for review of a wound on the left medial ankle. He has severe hypertension, severe obstructive sleep apnea on CPAP and morbid obesity. Is a problem apparently began in December with small veins that ruptured causing copious amounts of bleeding. He followed with Dr. [name] of [facility] underwent laser ablation in the left leg in January and on the right leg later on. He tolerated both of these procedures well. However he was admitted from 04/24/16 through 04/27/16 with cellulitis involving the left ankle. CT scan showed circumferential severe edema with posterior medial ankle fluid  collection. He was given vancomycin and Rocephin and was seen by Dr. [name] and underwent an IandD of the area. Aspiration of the left ankle was negative. Culture showed MRSA and he was discharged on doxycycline which she is still taking and will remain on total next week. He was discharged with iodoform packing to the surgical wound and a Ace wrap that is being applied by his wife. He was treated with vancomycin in the hospital. The patient does not have a prior history of lower extremity wounds although he has chronic venous insufficiency with inflammation and secondary lymphedema. Echocardiogram done in the hospital showed an EF of 50-55%. Grade 2 diastolic dysfunction ABIs in this clinic were 1.03 on the right 0.9 on the left 05/11/16; patient arrives back in clinic today for review of a surgical area on the left medial ankle. He has completed his antibiotics there is no evidence of surrounding infection and no pain. We started silver alginate last week in place of iodoform packing. 05/18/16; the patient's wound is smaller in terms of the orifice and now as 1.3 cm depth versus 1.7 cm last week. He has completed his antibiotics. His insurance did not approve a Snap Vac. The wound is now too small for a standard [employer] electrical Vac. 05/25/16 continues to improve in term of depth. 0.7x0.3x0.8. Ddp down by 0.5cm. original surgical wound 06/01/16; measuring slightly smaller. 0.7x0.3x0.6. still using collagen 06/08/16; filling in slowly. 0.7x0.3x0.4v still using collagen. Travelling to [place] next week [employer]) Signed: 06/08/2016 4:47:06 PM By: Linton Ham MD Entered By: Linton Ham on 06/08/2016 14:44:18 Jeffery Simon ([id number]) -------------------------------------------------------------------------------- Physical Exam Details Patient Name: Jeffery Simon. Date of Service: 06/08/2016 12:30 PM Medical Record Number: [id number] Patient Account Number: [id number] Date  of Birth/Sex: [birth year] (51 y.o. Male) Treating RN: Montey Hora Primary Care Provider: Greig Right Other Clinician: Referring Provider: Greig Right Treating Provider/Extender: Ricard Dillon Weeks in  Treatment: 5 Constitutional Sitting or standing Blood Pressure is within target range for patient.. Pulse regular and within target range for patient.Marland Kitchen Respirations regular, non-labored and within target range.. Temperature is normal and within the target range for the patient.. Patient's appearance is neat and clean. Appears in no acute distress. Well nourished and well developed.. Cardiovascular Pedal pulses palpable and strong bilaterally.Marland Kitchen edema is well controlled. Notes wound exam; down 0.2 cm in depth Electronic Signature(s) Signed: 06/08/2016 4:47:06 PM By: Linton Ham MD Entered By: Linton Ham on 06/08/2016 14:48:17 Jeffery Simon ([id number]) -------------------------------------------------------------------------------- Physician Orders Details Patient Name: Jeffery Simon. Date of Service: 06/08/2016 12:30 PM Medical Record Number: [id number] Patient Account Number: [id number] Date of Birth/Sex: Jun 04, 1965 (51 y.o. Male) Treating RN: Montey Hora Primary Care Provider: Greig Right Other Clinician: Referring Provider: Greig Right Treating Provider/Extender: Tito Dine in Treatment: 5 Verbal / Phone Orders: No Diagnosis Coding Wound Cleansing Wound #1 Left,Medial Malleolus o Cleanse wound with mild soap and water o May Shower, gently pat wound dry prior to applying new dressing. Anesthetic Wound #1 Left,Medial Malleolus o Topical Lidocaine 4% cream applied to wound bed prior to debridement Skin Barriers/Peri-Wound Care Wound #1 Left,Medial Malleolus o Triamcinolone Acetonide Ointment Primary Wound Dressing Wound #1 Left,Medial Malleolus o Prisma Ag Secondary Dressing Wound #1 Left,Medial Malleolus o  XtraSorb Dressing Change Frequency Wound #1 Left,Medial Malleolus o Change dressing every week Follow-up Appointments Wound #1 Left,Medial Malleolus o Return Appointment in 1 week. Edema Control Wound #1 Left,Medial Malleolus o 4-Layer Compression System - Left Lower Extremity o Elevate legs to the level of the heart and pump ankles as often as possible Jeffery Simon, Jeffery M. ([id number]) Additional Orders / Instructions Wound #1 Left,Medial Malleolus o Increase protein intake. o Activity as tolerated Electronic Signature(s) Signed: 06/08/2016 4:34:27 PM By: Montey Hora Signed: 06/08/2016 4:47:06 PM By: Linton Ham MD Entered By: Montey Hora on 06/08/2016 12:56:14 Jeffery Simon ([id number]) -------------------------------------------------------------------------------- Problem List Details Patient Name: Jeffery Simon, Jeffery Simon. Date of Service: 06/08/2016 12:30 PM Medical Record Number: [id number] Patient Account Number: [id number] Date of Birth/Sex: [birth year] (51 y.o. 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 Date of Service: 06/08/2016 12:30 PM Medical Record Number: [id number] Patient Account Number: [id number] Date of Birth/Sex: 01/17/1966 (51 y.o. Male) Treating RN: Montey Hora Primary Care Provider: Greig Right Other Clinician: Referring Provider: Greig Right Treating Provider/Extender: Tito Dine in Treatment: 5 Subjective Chief Complaint Information obtained from Patient 05/04/16; Patient is here for review of a post surgical wound on the left medial ankle History of Present Illness (HPI) 05/04/16; this is a 51 year old nondiabetic non-smoking man who is here for review of a wound on the left medial ankle. He has severe hypertension, severe obstructive sleep apnea on CPAP and morbid obesity. Is a problem apparently began in December with small veins that ruptured causing copious amounts of bleeding. He followed with Dr.  [name] of [facility] underwent laser ablation in the left leg in January and on the right leg later on. He tolerated both of these procedures well. However he was admitted from 04/24/16 through 04/27/16 with cellulitis involving the left ankle. CT scan showed circumferential severe edema with posterior medial ankle fluid collection. He was given vancomycin and Rocephin and was seen by Dr. [name] and underwent an IandD of the area. Aspiration of the left ankle was negative. Culture showed MRSA and he was discharged on doxycycline which she is still taking and will remain on total next week. He was discharged with iodoform packing to the surgical wound and a Ace wrap that is being applied by his wife. He was treated with vancomycin in the hospital. The patient does not have a prior history of lower extremity wounds although he has chronic venous insufficiency with inflammation and secondary lymphedema. Echocardiogram done in the hospital showed an EF of 50-55%. Grade 2 diastolic dysfunction ABIs in this clinic were 1.03 on the right 0.9 on the  left 05/11/16; patient arrives back in clinic today for review of a surgical area on the left medial ankle. He has completed his antibiotics there is no evidence of surrounding infection and no pain. We started silver alginate last week in place of iodoform packing. 05/18/16; the patient's wound is smaller in terms of the orifice and now as 1.3 cm depth versus 1.7 cm last week. He has completed his antibiotics. His insurance did not approve a Snap Vac. The wound is now too small for a standard [employer] electrical Vac. 05/25/16 continues to improve in term of depth. 0.7x0.3x0.8. Ddp down by 0.5cm. original surgical wound 06/01/16; measuring slightly smaller. 0.7x0.3x0.6. still using collagen 06/08/16; filling in slowly. 0.7x0.3x0.4v still using collagen. Travelling to [place] next week Jeffery Simon, Jeffery Simon. ([id number]) Objective Constitutional Sitting or standing Blood Pressure is within target range for patient.. Pulse regular and within target range for patient.Marland Kitchen Respirations regular, non-labored and within target range.. Temperature is normal and within the target range for the patient.. Patient's appearance is neat and clean. Appears in no acute distress. Well nourished and well developed.. Vitals Time Taken: 12:45 PM, Height: 71 in, Weight: 389 lbs, BMI: 54.2, Temperature: 98.0 F, Pulse: 78 bpm, Respiratory Rate: 18 breaths/min, Blood Pressure: 126/65 mmHg. Cardiovascular Pedal pulses palpable and strong bilaterally.Marland Kitchen edema is well controlled. General Notes: wound exam; down 0.2 cm in depth Integumentary (Hair, Skin) Wound #1 status is Open. Original cause of wound was Gradually Appeared. The wound is located on the Left,Medial Malleolus. The wound measures 0.7cm length x 0.3cm width x 0.4cm depth; 0.165cm^2 area and 0.066cm^3 volume. There is Fat Layer (Subcutaneous Tissue) Exposed exposed. There is no tunneling or undermining noted. There is a medium amount of serosanguineous drainage noted. The  wound margin is distinct with the outline attached to the wound base. There is medium (34-66%) pink granulation within the wound bed. There is a medium (34-66%) amount of necrotic tissue within the wound bed including Adherent Slough. The periwound skin appearance exhibited: Hemosiderin Staining, Mottled, Erythema. The periwound skin appearance did not exhibit: Callus, Crepitus, Excoriation, Induration, Rash, Scarring, Dry/Scaly, Maceration, Atrophie Blanche, Cyanosis, Ecchymosis, Pallor, Rubor. The surrounding wound skin color is noted with erythema which is circumferential. Periwound temperature was noted as No Abnormality. The periwound has tenderness on palpation. 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 Anesthetic: Wound #1 Left,Medial Malleolus: Topical Lidocaine 4% cream applied to wound bed prior to debridement Skin Barriers/Peri-Wound Care: Wound #1 Left,Medial Malleolus: Triamcinolone Acetonide Ointment Primary Wound Dressing: Wound #1 Left,Medial Malleolus: Prisma Ag Secondary Dressing: Wound #1 Left,Medial Malleolus: XtraSorb Dressing Change Frequency: Wound #1 Left,Medial Malleolus: Change dressing every week Follow-up Appointments: Wound #1 Left,Medial Malleolus: Return Appointment in 1 week. Edema Control: Wound #1 Left,Medial Malleolus: 4-Layer Compression System - Left Lower Extremity Elevate legs to the level of the heart and pump ankles as often as possible Additional Orders /  Instructions: Wound #1 Left,Medial Malleolus: Increase protein intake. 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 Date of Service: 06/08/2016 Medical Record Number: [id number] Patient Account Number: [id number] Date of Birth/Sex: [birth year] (51 y.o. 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 Facility Procedures CPT4: Description Modifier Quantity Code [id number] (Facility Use Only) [phone] - [facility] LEG Physician Procedures CPT4: Description Modifier Quantity Code [id number] 17001 - WC PHYS LEVEL 2 - EST PT 1 ICD-10 Description Diagnosis T81.31XD Disruption of external operation (surgical) wound, not elsewhere classified, subsequent encounter I87.332 Chronic venous  hypertension (idiopathic) with ulcer and inflammation of left lower extremity Electronic Signature(s) Signed: 06/08/2016 4:47:06 PM By: Linton Ham MD Previous Signature: 06/08/2016 1:31:01 PM Version By: Montey Hora Entered By: Linton Ham on 06/08/2016 14:49:36

## 2016-06-09 NOTE — Progress Notes (Signed)
ASHRAF, MESTA ([id number]) Visit Report for 06/08/2016 Arrival Information Details Patient Name: Jeffery Simon, Jeffery Simon. Date of Service: 06/08/2016 12:30 PM Medical Record Number: [id number] Patient Account Number: [id number] Date of Birth/Sex: 02/04/1965 (51 y.o. Male) Treating RN: Montey Hora Primary Care Asencion Loveday: Greig Right Other Clinician: Referring Breana Litts: Greig Right Treating Manna Gose/Extender: Tito Dine in Treatment: 5 Visit Information History Since Last Visit Added or deleted any medications: No Patient Arrived: Ambulatory Any new allergies or adverse reactions: No Arrival Time: 12:35 Had a fall or experienced change in No Accompanied By: self activities of daily living that may affect Transfer Assistance: None risk of falls: Patient Identification Verified: Yes Signs or symptoms of abuse/neglect since last No Secondary Verification Process Yes visito Completed: Hospitalized since last visit: No Patient Requires Transmission-Based No Has Dressing in Place as Prescribed: Yes Precautions: Pain Present Now: No Patient Has Alerts: No Electronic Signature(s) Signed: 06/08/2016 4:34:27 PM By: Montey Hora Entered By: Montey Hora on 06/08/2016 12:35:51 Jeffery Simon ([id number]) -------------------------------------------------------------------------------- Encounter Discharge Information Details Patient Name: Jeffery Simon. Date of Service: 06/08/2016 12:30 PM Medical Record Number: [id number] Patient Account Number: [id number] Date of Birth/Sex: [birth year] (51 y.o. Male) Treating RN: Montey Hora Primary Care Zailee Vallely: Greig Right Other Clinician: Referring Smitty Ackerley: Greig Right Treating Rozelia Catapano/Extender: Tito Dine in Treatment: 5 Encounter Discharge Information Items Discharge Pain Level: 0 Discharge Condition: Stable Ambulatory Status: Ambulatory Discharge Destination: Home Transportation: Private  Auto Accompanied By: self Schedule Follow-up Appointment: Yes Medication Reconciliation completed and provided to Patient/Care No Alithea Lapage: Provided on Clinical Summary of Care: 06/08/2016 Form Type Recipient Paper Patient HL Electronic Signature(s) Signed: 06/08/2016 1:31:53 PM By: Montey Hora Previous Signature: 06/08/2016 1:22:16 PM Version By: Ruthine Dose Entered By: Montey Hora on 06/08/2016 13:31:53 Jeffery Simon ([id number]) -------------------------------------------------------------------------------- Lower Extremity Assessment Details Patient Name: Jeffery Simon. Date of Service: 06/08/2016 12:30 PM Medical Record Number: [id number] Patient Account Number: [id number] Date of Birth/Sex: Jun 04, 1965 (51 y.o. Male) Treating RN: Montey Hora Primary Care Archibald Marchetta: Greig Right Other Clinician: Referring Kinsley Holderman: Greig Right Treating Mikaelah Trostle/Extender: Ricard Dillon Weeks in Treatment: 5 Edema Assessment Assessed: [Left: No] [Right: No] E[Left: dema] [Right: :] Calf Left: Right: Point of Measurement: 38 cm From Medial Instep 49 cm cm Ankle Left: Right: Point of Measurement: 12 cm From Medial Instep 28.5 cm cm Vascular Assessment Pulses: Dorsalis Pedis Palpable: [Left:Yes] Posterior Tibial Extremity colors, hair growth, and conditions: Extremity Color: [Left:Hyperpigmented] Hair Growth on Extremity: [Left:Yes] Temperature of Extremity: [Left:Warm] Capillary Refill: [Left:< 3 seconds] Electronic Signature(s) Signed: 06/08/2016 4:34:27 PM By: Montey Hora Entered By: Montey Hora on 06/08/2016 12:44:37 Jeffery Simon ([id number]) -------------------------------------------------------------------------------- Multi Wound Chart Details Patient Name: Jeffery Simon. Date of Service: 06/08/2016 12:30 PM Medical Record Number: [id number] Patient Account Number: [id number] Date of Birth/Sex: Aug 07, 1965 (51 y.o. Male) Treating RN:  Montey Hora Primary Care Manus Weedman: Greig Right Other Clinician: Referring Natelie Ostrosky: Greig Right Treating Travonne Schowalter/Extender: Ricard Dillon Weeks in Treatment: 5 Vital Signs Height(in): 71 Pulse(bpm): 78 Weight(lbs): 389 Blood Pressure 126/65 (mmHg): Body Mass Index(BMI): 54 Temperature(F): 98.0 Respiratory Rate 18 (breaths/min): Photos: [1:No Photos] [N/A:N/A] Wound Location: [1:Left Malleolus - Medial] [N/A:N/A] Wounding Event: [1:Gradually Appeared] [N/A:N/A] Primary Etiology: [1:Dehisced Wound] [N/A:N/A] Comorbid History: [1:Sleep Apnea, Hypertension] [N/A:N/A] Date Acquired: [1:04/25/2016] [N/A:N/A] Weeks of Treatment: [1:5] [N/A:N/A] Wound Status: [1:Open] [N/A:N/A] Measurements L x W x D 0.7x0.3x0.4 [N/A:N/A] (cm) Area (cm) : [1:0.165] [N/A:N/A] Volume (cm) : [1:0.066] [N/A:N/A] % Reduction in Area: [1:65.00%] [N/A:N/A] % Reduction in Volume:  88.30% [N/A:N/A] Classification: [1:Full Thickness Without Exposed Support Structures] [N/A:N/A] Exudate Amount: [1:Medium] [N/A:N/A] Exudate Type: [1:Serosanguineous] [N/A:N/A] Exudate Color: [1:red, brown] [N/A:N/A] Wound Margin: [1:Distinct, outline attached] [N/A:N/A] Granulation Amount: [1:Medium (34-66%)] [N/A:N/A] Granulation Quality: [1:Pink] [N/A:N/A] Necrotic Amount: [1:Medium (34-66%)] [N/A:N/A] Exposed Structures: [1:Fat Layer (Subcutaneous Tissue) Exposed: Yes Fascia: No Tendon: No Muscle: No] [N/A:N/A] Joint: No Bone: No Epithelialization: None N/A N/A Periwound Skin Texture: Excoriation: No N/A N/A Induration: No Callus: No Crepitus: No Rash: No Scarring: No Periwound Skin Maceration: No N/A N/A Moisture: Dry/Scaly: No Periwound Skin Color: Erythema: Yes N/A N/A Hemosiderin Staining: Yes Mottled: Yes Atrophie Blanche: No Cyanosis: No Ecchymosis: No Pallor: No Rubor: No Erythema Location: Circumferential N/A N/A Temperature: No Abnormality N/A N/A Tenderness on Yes N/A  N/A Palpation: Wound Preparation: Ulcer Cleansing: Other: N/A N/A soap and water Topical Anesthetic Applied: Other: lidocaine 4% Treatment Notes Wound #1 (Left, Medial Malleolus) 1. Cleansed with: Cleanse wound with antibacterial soap and water 2. Anesthetic Topical Lidocaine 4% cream to wound bed prior to debridement 3. Peri-wound Care: Other peri-wound care (specify in notes) 4. Dressing Applied: Prisma Ag Other dressing (specify in notes) 7. Secured with 4-Layer Compression System - Left Lower Extremity Notes xtrasorb, TCA to periwound Electronic Signature(s) GIOVAN, PINSKY ([id number]) Signed: 06/08/2016 4:47:06 PM By: Linton Ham MD Entered By: Linton Ham on 06/08/2016 14:43:06 Jeffery Simon ([id number]) -------------------------------------------------------------------------------- [place] Details Patient Name: Jeffery Simon, Jeffery Simon. Date of Service: 06/08/2016 12:30 PM Medical Record Number: [id number] Patient Account Number: [id number] Date of Birth/Sex: 1965/04/18 (51 y.o. Male) Treating RN: Montey Hora Primary Care Essie Lagunes: Greig Right Other Clinician: Referring Ximena Todaro: Greig Right Treating Kynzee Devinney/Extender: Tito Dine in Treatment: 5 Active Inactive ` Orientation to the Wound Care Program Nursing Diagnoses: Knowledge deficit related to the wound healing center program Goals: Patient/caregiver will verbalize understanding of the [facility] Program Date Initiated: 05/04/2016 Target Resolution Date: 09/03/2016 Goal Status: Active Interventions: Provide education on orientation to the wound center Notes: ` Venous Leg Ulcer Nursing Diagnoses: Actual venous Insuffiency (use after diagnosis is confirmed) Potential for venous Insuffiency (use before diagnosis confirmed) Goals: Non-invasive venous studies are completed as ordered Date Initiated: 05/04/2016 Target Resolution Date:  09/03/2016 Goal Status: Active Patient will maintain optimal edema control Date Initiated: 05/04/2016 Target Resolution Date: 09/03/2016 Goal Status: Active Patient/caregiver will verbalize understanding of disease process and disease management Date Initiated: 05/04/2016 Target Resolution Date: 09/03/2016 Goal Status: Active Verify adequate tissue perfusion prior to therapeutic compression application Date Initiated: 05/04/2016 Target Resolution Date: 09/03/2016 Goal Status: Active Jeffery Simon, Jeffery Simon. ([id number]) Interventions: Assess peripheral edema status every visit. Compression as ordered Provide education on venous insufficiency Treatment Activities: Therapeutic compression applied : 05/04/2016 Notes: ` Wound/Skin Impairment Nursing Diagnoses: Impaired tissue integrity Knowledge deficit related to ulceration/compromised skin integrity Goals: Patient/caregiver will verbalize understanding of skin care regimen Date Initiated: 05/04/2016 Target Resolution Date: 09/03/2016 Goal Status: Active Ulcer/skin breakdown will have a volume reduction of 30% by week 4 Date Initiated: 05/04/2016 Target Resolution Date: 09/03/2016 Goal Status: Active Ulcer/skin breakdown will have a volume reduction of 50% by week 8 Date Initiated: 05/04/2016 Target Resolution Date: 09/03/2016 Goal Status: Active Ulcer/skin breakdown will have a volume reduction of 80% by week 12 Date Initiated: 05/04/2016 Target Resolution Date: 09/03/2016 Goal Status: Active Ulcer/skin breakdown will heal within 14 weeks Date Initiated: 05/04/2016 Target Resolution Date: 09/03/2016 Goal Status: Active Interventions: Assess patient/caregiver ability to obtain necessary supplies Assess patient/caregiver ability to perform ulcer/skin care regimen upon admission  and as needed Assess ulceration(s) every visit Provide education on ulcer and skin care Treatment Activities: Referred to DME Adena Sima for dressing supplies :  05/04/2016 Skin care regimen initiated : 05/04/2016 Topical wound management initiated : 05/04/2016 Jeffery Simon, Jeffery Simon ([id number]) Notes: Electronic Signature(s) Signed: 06/08/2016 4:34:27 PM By: Montey Hora Entered By: Montey Hora on 06/08/2016 12:52:41 Jeffery Simon ([id number]) -------------------------------------------------------------------------------- Pain Assessment Details Patient Name: Jeffery Simon. Date of Service: 06/08/2016 12:30 PM Medical Record Number: [id number] Patient Account Number: [id number] Date of Birth/Sex: 07/20/1965 (51 y.o. Male) Treating RN: Montey Hora Primary Care Kalee Broxton: Greig Right Other Clinician: Referring Devanie Galanti: Greig Right Treating Twanna Resh/Extender: Ricard Dillon Weeks in Treatment: 5 Active Problems Location of Pain Severity and Description of Pain Patient Has Paino No Site Locations Pain Management and Medication Current Pain Management: Notes Topical or injectable lidocaine is offered to patient for acute pain when surgical debridement is performed. If needed, Patient is instructed to use over the counter pain medication for the following 24-48 hours after debridement. Wound care MDs do not prescribed pain medications. Patient has chronic pain or uncontrolled pain. Patient has been instructed to make an appointment with their Primary Care Physician for pain management. Electronic Signature(s) Signed: 06/08/2016 4:34:27 PM By: Montey Hora Entered By: Montey Hora on 06/08/2016 12:36:02 Jeffery Simon ([id number]) -------------------------------------------------------------------------------- Patient/Caregiver Education Details Patient Name: Jeffery Simon. Date of Service: 06/08/2016 12:30 PM Medical Record Number: [id number] Patient Account Number: [id number] Date of Birth/Gender: [birth year] (51 y.o. Male) Treating RN: Montey Hora Primary Care Physician: Greig Right Other  Clinician: Referring Physician: Greig Right Treating Physician/Extender: Tito Dine in Treatment: 5 Education Assessment Education Provided To: Patient Education Topics Provided Wound/Skin Impairment: Handouts: Other: wound care if needed while on trip Methods: Explain/Verbal Responses: State content correctly Electronic Signature(s) Signed: 06/08/2016 4:34:27 PM By: Montey Hora Entered By: Montey Hora on 06/08/2016 13:32:17 Jeffery Simon ([id number]) -------------------------------------------------------------------------------- Wound Assessment Details Patient Name: Jeffery Simon. Date of Service: 06/08/2016 12:30 PM Medical Record Number: [id number] Patient Account Number: [id number] Date of Birth/Sex: 1965-10-23 (51 y.o. Male) Treating RN: Montey Hora Primary Care Caryl Manas: Greig Right Other Clinician: Referring Tiona Ruane: Greig Right Treating Savaughn Karwowski/Extender: Ricard Dillon Weeks in Treatment: 5 Wound Status Wound Number: 1 Primary Etiology: Dehisced Wound Wound Location: Left Malleolus - Medial Wound Status: Open Wounding Event: Gradually Appeared Comorbid History: Sleep Apnea, Hypertension Date Acquired: 04/25/2016 Weeks Of Treatment: 5 Clustered Wound: No Photos Photo Uploaded By: Montey Hora on 06/08/2016 15:26:25 Wound Measurements Length: (cm) 0.7 Width: (cm) 0.3 Depth: (cm) 0.4 Area: (cm) 0.165 Volume: (cm) 0.066 % Reduction in Area: 65% % Reduction in Volume: 88.3% Epithelialization: None Tunneling: No Undermining: No Wound Description Full Thickness Without Exposed Classification: Support Structures Wound Margin: Distinct, outline attached Exudate Medium Amount: Exudate Type: Serosanguineous Exudate Color: red, brown Foul Odor After Cleansing: No Slough/Fibrino Yes Wound Bed Granulation Amount: Medium (34-66%) Exposed Structure Granulation Quality: Pink Fascia Exposed: No Necrotic Amount:  Medium (34-66%) Fat Layer (Subcutaneous Tissue) Exposed: Yes Jeffery Simon, Jeffery Simon ([id number]) Necrotic Quality: Adherent Slough Tendon Exposed: No Muscle Exposed: No Joint Exposed: No Bone Exposed: No Periwound Skin Texture Texture Color No Abnormalities Noted: No No Abnormalities Noted: No Callus: No Atrophie Blanche: No Crepitus: No Cyanosis: No Excoriation: No Ecchymosis: No Induration: No Erythema: Yes Rash: No Erythema Location: Circumferential Scarring: No Hemosiderin Staining: Yes Mottled: Yes Moisture Pallor: No No Abnormalities Noted: No Rubor: No Dry / Scaly: No Maceration: No Temperature / Pain Temperature: No Abnormality  Tenderness on Palpation: Yes Wound Preparation Ulcer Cleansing: Other: soap and water, Topical Anesthetic Applied: Other: lidocaine 4%, Treatment Notes Wound #1 (Left, Medial Malleolus) 1. Cleansed with: Cleanse wound with antibacterial soap and water 2. Anesthetic Topical Lidocaine 4% cream to wound bed prior to debridement 3. Peri-wound Care: Other peri-wound care (specify in notes) 4. Dressing Applied: Prisma Ag Other dressing (specify in notes) 7. Secured with 4-Layer Compression System - Left Lower Extremity Notes xtrasorb, TCA to periwound Electronic Signature(s) Signed: 06/08/2016 4:34:27 PM By: Montey Hora Entered By: Montey Hora on 06/08/2016 12:51:10 Jeffery Simon ([id number]) -------------------------------------------------------------------------------- [place] Details Patient Name: Jeffery Simon. Date of Service: 06/08/2016 12:30 PM Medical Record Number: [id number] Patient Account Number: [id number] Date of Birth/Sex: 05-21-1965 (51 y.o. Male) Treating RN: Montey Hora Primary Care Nikolus Marczak: Greig Right Other Clinician: Referring Findley Vi: Greig Right Treating Dylanie Quesenberry/Extender: Ricard Dillon Weeks in Treatment: 5 Vital Signs Time Taken: 12:45 Temperature (F): 98.0 Height (in):  71 Pulse (bpm): 78 Weight (lbs): 389 Respiratory Rate (breaths/min): 18 Body Mass Index (BMI): 54.2 Blood Pressure (mmHg): 126/65 Reference Range: 80 - 120 mg / dl Electronic Signature(s) Signed: 06/08/2016 4:34:27 PM By: Montey Hora Entered By: Montey Hora on 06/08/2016 12:45:20

## 2016-06-18 DIAGNOSIS — L97323 Non-pressure chronic ulcer of left ankle with necrosis of muscle: Secondary | ICD-10-CM | POA: Diagnosis not present

## 2016-06-18 DIAGNOSIS — I10 Essential (primary) hypertension: Secondary | ICD-10-CM | POA: Diagnosis not present

## 2016-06-18 DIAGNOSIS — Z6841 Body Mass Index (BMI) 40.0 and over, adult: Secondary | ICD-10-CM | POA: Diagnosis not present

## 2016-06-18 DIAGNOSIS — G4733 Obstructive sleep apnea (adult) (pediatric): Secondary | ICD-10-CM | POA: Diagnosis not present

## 2016-06-18 DIAGNOSIS — T8131XD Disruption of external operation (surgical) wound, not elsewhere classified, subsequent encounter: Secondary | ICD-10-CM | POA: Diagnosis not present

## 2016-06-18 DIAGNOSIS — I87332 Chronic venous hypertension (idiopathic) with ulcer and inflammation of left lower extremity: Secondary | ICD-10-CM | POA: Diagnosis not present

## 2016-06-19 NOTE — Progress Notes (Addendum)
ANCELMO, HUNT ([id number]) Visit Report for 06/18/2016 Arrival Information Details Patient Name: Jeffery Simon, Jeffery Simon. Date of Service: 06/18/2016 9:15 AM Medical Record Number: [id number] Patient Account Number: [id number] Date of Birth/Sex: Jul 14, 1965 (51 y.o. Male) Treating RN: Montey Hora Primary Care Deanta Mincey: Greig Right Other Clinician: Referring Corleen Otwell: Greig Right Treating Alaa Mullally/Extender: Suella Grove in Treatment: 6 Visit Information History Since Last Visit Added or deleted any medications: No Patient Arrived: Ambulatory Any new allergies or adverse reactions: No Arrival Time: 09:25 Had a fall or experienced change in No Accompanied By: self activities of daily living that may affect Transfer Assistance: None risk of falls: Patient Identification Verified: Yes Signs or symptoms of abuse/neglect since last No Secondary Verification Process Yes visito Completed: Hospitalized since last visit: No Patient Requires Transmission-Based No Has Dressing in Place as Prescribed: Yes Precautions: Has Compression in Place as Prescribed: Yes Patient Has Alerts: No Pain Present Now: No Electronic Signature(s) Signed: 06/18/2016 10:16:29 AM By: Montey Hora Previous Signature: 06/18/2016 10:13:18 AM Version By: Montey Hora Entered By: Montey Hora on 06/18/2016 10:16:28 Jeffery Simon ([id number]) -------------------------------------------------------------------------------- Encounter Discharge Information Details Patient Name: Jeffery Simon. Date of Service: 06/18/2016 9:15 AM Medical Record Number: [id number] Patient Account Number: [id number] Date of Birth/Sex: December 24, 1965 (51 y.o. Male) Treating RN: Montey Hora Primary Care Kenza Munar: Greig Right Other Clinician: Referring Tremayne Sheldon: Greig Right Treating Alaira Level/Extender: Suella [place] in Treatment: 6 Encounter Discharge Information Items Discharge Pain Level: 0 Discharge Condition:  Stable Ambulatory Status: Ambulatory Discharge Destination: Home Private Transportation: Auto Accompanied By: self Schedule Follow-up Appointment: Yes Medication Reconciliation completed and No provided to Patient/Care Sandra Brents: Clinical Summary of Care: Electronic Signature(s) Signed: 06/18/2016 10:15:45 AM By: Montey Hora Previous Signature: 06/18/2016 10:15:01 AM Version By: Montey Hora Entered By: Montey Hora on 06/18/2016 10:15:45 Jeffery Simon ([id number]) -------------------------------------------------------------------------------- Patient/Caregiver Education Details Patient Name: Jeffery Simon. Date of Service: 06/18/2016 9:15 AM Medical Record Number: [id number] Patient Account Number: [id number] Date of Birth/Gender: 1965/05/06 (51 y.o. Male) Treating RN: Montey Hora Primary Care Physician: Greig Right Other Clinician: Referring Physician: Greig Right Treating Physician/Extender: Suella Grove in Treatment: 6 Education Assessment Education Provided To: Patient Education Topics Provided Venous: Handouts: Other: leg elevation Methods: Explain/Verbal Responses: State content correctly Electronic Signature(s) Signed: 06/18/2016 3:54:46 PM By: Montey Hora Entered By: Montey Hora on 06/18/2016 10:15:53 Jeffery Simon ([id number]) -------------------------------------------------------------------------------- Wound Assessment Details Patient Name: Jeffery Simon. Date of Service: 06/18/2016 9:15 AM Medical Record Number: [id number] Patient Account Number: [id number] Date of Birth/Sex: November 05, 1965 (51 y.o. Male) Treating RN: Montey Hora Primary Care Daveon Arpino: Greig Right Other Clinician: Referring Shayle Donahoo: Greig Right Treating Magnolia Mattila/Extender: Suella Grove in Treatment: 6 Wound Status Wound Number: 1 Primary Etiology: Dehisced Wound Wound Location: Left Malleolus - Medial Wound Status: Open Wounding Event: Gradually  Appeared Comorbid History: Sleep Apnea, Hypertension Date Acquired: 04/25/2016 Weeks Of Treatment: 6 Clustered Wound: No Wound Measurements Length: (cm) 0.7 Width: (cm) 0.3 Depth: (cm) 0.4 Area: (cm) 0.165 Volume: (cm) 0.066 % Reduction in Area: 65% % Reduction in Volume: 88.3% Epithelialization: None Tunneling: No Undermining: No Wound Description Full Thickness Without Exposed Classification: Support Structures Wound Margin: Distinct, outline attached Exudate Medium Amount: Exudate Type: Serosanguineous Exudate Color: red, brown Foul Odor After Cleansing: No Slough/Fibrino Yes Wound Bed Granulation Amount: Large (67-100%) Exposed Structure Granulation Quality: Pink Fascia Exposed: No Necrotic Amount: Small (1-33%) Fat Layer (Subcutaneous Tissue) Exposed: Yes Necrotic Quality: Adherent Slough Tendon Exposed: No Muscle Exposed: No Joint Exposed: No Bone Exposed: No Periwound Skin Texture Texture Color No Abnormalities Noted:  No No Abnormalities Noted: No Callus: No Atrophie Blanche: No Crepitus: No Cyanosis: No Excoriation: No Ecchymosis: No Jeffery Simon, Jeffery Simon. ([id number]) Induration: No Erythema: Yes Rash: No Erythema Location: Circumferential Scarring: No Hemosiderin Staining: Yes Mottled: Yes Moisture Pallor: No No Abnormalities Noted: No Rubor: No Dry / Scaly: No Maceration: No Temperature / Pain Temperature: No Abnormality Tenderness on Palpation: Yes Wound Preparation Ulcer Cleansing: Other: soap and water, Topical Anesthetic Applied: None Treatment Notes Wound #1 (Left, Medial Malleolus) 1. Cleansed with: Cleanse wound with antibacterial soap and water 3. Peri-wound Care: Other peri-wound care (specify in notes) 4. Dressing Applied: Prisma Ag Other dressing (specify in notes) 7. Secured with 4-Layer Compression System - Left Lower Extremity Notes xtrasorb, TCA to periwound Electronic Signature(s) Signed: 06/18/2016 10:16:48 AM  By: Montey Hora Previous Signature: 06/18/2016 10:13:57 AM Version By: Montey Hora Entered By: Montey Hora on 06/18/2016 10:16:48

## 2016-06-25 ENCOUNTER — Encounter: Payer: BLUE CROSS/BLUE SHIELD | Attending: Surgery | Admitting: Surgery

## 2016-06-25 DIAGNOSIS — T8131XD Disruption of external operation (surgical) wound, not elsewhere classified, subsequent encounter: Secondary | ICD-10-CM | POA: Diagnosis not present

## 2016-06-25 DIAGNOSIS — Z6841 Body Mass Index (BMI) 40.0 and over, adult: Secondary | ICD-10-CM | POA: Diagnosis not present

## 2016-06-25 DIAGNOSIS — I87332 Chronic venous hypertension (idiopathic) with ulcer and inflammation of left lower extremity: Secondary | ICD-10-CM | POA: Diagnosis not present

## 2016-06-25 DIAGNOSIS — I10 Essential (primary) hypertension: Secondary | ICD-10-CM | POA: Diagnosis not present

## 2016-06-25 DIAGNOSIS — Y839 Surgical procedure, unspecified as the cause of abnormal reaction of the patient, or of later complication, without mention of misadventure at the time of the procedure: Secondary | ICD-10-CM | POA: Diagnosis not present

## 2016-06-25 DIAGNOSIS — L97323 Non-pressure chronic ulcer of left ankle with necrosis of muscle: Secondary | ICD-10-CM | POA: Diagnosis not present

## 2016-06-25 DIAGNOSIS — G4733 Obstructive sleep apnea (adult) (pediatric): Secondary | ICD-10-CM | POA: Diagnosis not present

## 2016-06-25 DIAGNOSIS — T8189XA Other complications of procedures, not elsewhere classified, initial encounter: Secondary | ICD-10-CM | POA: Diagnosis not present

## 2016-06-26 NOTE — Addendum Note (Signed)
Addendum  created 06/26/16 0801 by Arlena Marsan, MD   Sign clinical note    

## 2016-06-26 NOTE — Progress Notes (Signed)
Jeffery Simon, Jeffery Simon ([id number]) Visit Report for 06/25/2016 Chief Complaint Document Details Patient Name: Jeffery Simon, Jeffery Simon. Date of Service: 06/25/2016 11:15 AM Medical Record Number: [id number] Patient Account Number: [id number] Date of Birth/Sex: 01-May-1965 (51 y.o. Male) Treating RN: Montey Hora Primary Care Provider: Greig Right Other Clinician: Referring Provider: Greig Right Treating Provider/Extender: Frann Rider in Treatment: 7 Information Obtained from: Patient Chief Complaint 05/04/16; Patient is here for review of a post surgical wound on the left medial ankle Electronic Signature(s) Signed: 06/25/2016 12:07:14 PM By: Christin Fudge MD, FACS Entered By: Christin Fudge on 06/25/2016 12:07:13 Jeffery Simon ([id number]) -------------------------------------------------------------------------------- HPI Details Patient Name: Jeffery Simon. Date of Service: 06/25/2016 11:15 AM Medical Record Number: [id number] Patient Account Number: [id number] Date of Birth/Sex: February 04, 1965 (51 y.o. Male) Treating RN: Montey Hora Primary Care Provider: Greig Right Other Clinician: Referring Provider: Greig Right Treating Provider/Extender: Frann Rider in Treatment: 7 History of Present Illness HPI Description: 05/04/16; this is a 51 year old nondiabetic non-smoking man who is here for review of a wound on the left medial ankle. He has severe hypertension, severe obstructive sleep apnea on CPAP and morbid obesity. Is a problem apparently began in December with small veins that ruptured causing copious amounts of bleeding. He followed with Dr. [name] of [facility] underwent laser ablation in the left leg in January and on the right leg later on. He tolerated both of these procedures well. However he was admitted from 04/24/16 through 04/27/16 with cellulitis involving the left ankle. CT scan showed circumferential severe edema with posterior medial ankle fluid collection.  He was given vancomycin and Rocephin and was seen by Dr. [name] and underwent an IandD of the area. Aspiration of the left ankle was negative. Culture showed MRSA and he was discharged on doxycycline which she is still taking and will remain on total next week. He was discharged with iodoform packing to the surgical wound and a Ace wrap that is being applied by his wife. He was treated with vancomycin in the hospital. The patient does not have a prior history of lower extremity wounds although he has chronic venous insufficiency with inflammation and secondary lymphedema. Echocardiogram done in the hospital showed an EF of 50-55%. Grade 2 diastolic dysfunction ABIs in this clinic were 1.03 on the right 0.9 on the left 05/11/16; patient arrives back in clinic today for review of a surgical area on the left medial ankle. He has completed his antibiotics there is no evidence of surrounding infection and no pain. We started silver alginate last week in place of iodoform packing. 05/18/16; the patient's wound is smaller in terms of the orifice and now as 1.3 cm depth versus 1.7 cm last week. He has completed his antibiotics. His insurance did not approve a Snap Vac. The wound is now too small for a standard [employer] electrical Vac. 05/25/16 continues to improve in term of depth. 0.7x0.3x0.8. Ddp down by 0.5cm. original surgical wound 06/01/16; measuring slightly smaller. 0.7x0.3x0.6. still using collagen 06/08/16; filling in slowly. 0.7x0.3x0.4v still using collagen. Travelling to [place] next week 06/25/2016 -- the patient comes today as he has been traveling a lot and was unable to keep his previous appointments with Dr. Dellia Nims. He has not been wearing his compression stockings as advised and his dressing is not changed regularly as he has had a 4-layer compression applied. Electronic Signature(s) Signed: 06/25/2016 12:07:55 PM By: Christin Fudge MD, FACS Entered By: Christin Fudge on 06/25/2016  12:07:55 Jeffery Simon ([id number]) -------------------------------------------------------------------------------- Physical Exam  Details Patient Name: Jeffery Simon, Jeffery Simon. Date of Service: 06/25/2016 11:15 AM Medical Record Number: [id number] Patient Account Number: [id number] Date of Birth/Sex: August 16, 1965 (51 y.o. Male) Treating RN: Montey Hora Primary Care Provider: Greig Right Other Clinician: Referring Provider: Greig Right Treating Provider/Extender: Frann Rider in Treatment: 7 Constitutional . Pulse regular. Respirations normal and unlabored. Afebrile. . Eyes Nonicteric. Reactive to light. Ears, Nose, Mouth, and Throat Lips, teeth, and gums WNL.Marland Kitchen Moist mucosa without lesions. Neck supple and nontender. No palpable supraclavicular or cervical adenopathy. Normal sized without goiter. Respiratory WNL. No retractions.. Breath sounds WNL, No rubs, rales, rhonchi, or wheeze.. Cardiovascular Heart rhythm and rate regular, no murmur or gallop.. Pedal Pulses WNL. No clubbing, cyanosis or edema. Chest Breasts symmetical and no nipple discharge.. Breast tissue WNL, no masses, lumps, or tenderness.. Lymphatic No adneopathy. No adenopathy. No adenopathy. Musculoskeletal Adexa without tenderness or enlargement.. Digits and nails w/o clubbing, cyanosis, infection, petechiae, ischemia, or inflammatory conditions.. Integumentary (Hair, Skin) No suspicious lesions. No crepitus or fluctuance. No peri-wound warmth or erythema. No masses.Marland Kitchen Psychiatric Judgement and insight Intact.. No evidence of depression, anxiety, or agitation.. Notes the wound on the left medial ankle has quite a significant amount of depth and has slough at the base which is difficult to remove with a curette because of the proximity to a lot of venous structures. He will benefit from the application of Santyl ointment Electronic Signature(s) Signed: 06/25/2016 12:08:51 PM By: Christin Fudge MD,  FACS Entered By: Christin Fudge on 06/25/2016 12:08:50 Jeffery Simon ([id number]) -------------------------------------------------------------------------------- Physician Orders Details Patient Name: Jeffery Simon. Date of Service: 06/25/2016 11:15 AM Medical Record Number: [id number] Patient Account Number: [id number] Date of Birth/Sex: 1965/07/29 (51 y.o. Male) Treating RN: Montey Hora Primary Care Provider: Greig Right Other Clinician: Referring Provider: Greig Right Treating Provider/Extender: Frann Rider in Treatment: 7 Verbal / Phone Orders: No Diagnosis Coding Wound Cleansing Wound #1 Left,Medial Malleolus o Cleanse wound with mild soap and water o May Shower, gently pat wound dry prior to applying new dressing. Anesthetic Wound #1 Left,Medial Malleolus o Topical Lidocaine 4% cream applied to wound bed prior to debridement Primary Wound Dressing Wound #1 Left,Medial Malleolus o Santyl Ointment Secondary Dressing Wound #1 Left,Medial Malleolus o Dry Gauze o Boardered Foam Dressing Dressing Change Frequency Wound #1 Left,Medial Malleolus o Change dressing every day. Follow-up Appointments Wound #1 Left,Medial Malleolus o Return Appointment in 1 week. Edema Control Wound #1 Left,Medial Malleolus o Patient to wear own compression stockings - Please get 30-64mmHg compression socks from [place] and wear them daily from morning until bedtime o Elevate legs to the level of the heart and pump ankles as often as possible Additional Orders / Instructions Wound #1 Left,Medial Malleolus o Increase protein intake. Jeffery Simon, Jeffery Simon ([id number]) o Activity as tolerated Medications-please add to medication list. Wound #1 Left,Medial Malleolus o Santyl Enzymatic Ointment Patient Medications Allergies: No Known Allergies Notifications Medication Indication Start End Santyl 06/25/2016 DOSE topical 250 unit/gram ointment -  ointment topical as directed Electronic Signature(s) Signed: 06/25/2016 12:06:28 PM By: Christin Fudge MD, FACS Entered By: Christin Fudge on 06/25/2016 12:06:27 Jeffery Simon ([id number]) -------------------------------------------------------------------------------- Problem List Details Patient Name: Jeffery Simon. Date of Service: 06/25/2016 11:15 AM Medical Record Number: [id number] Patient Account Number: [id number] Date of Birth/Sex: Oct 15, 1965 (51 y.o. 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 Date of Service: 06/25/2016 11:15 AM Medical Record Number: [id number] Patient Account Number: [id number] Date of Birth/Sex: 01-14-1966 (51 y.o. Male) Treating RN: Montey Hora Primary Care Provider: Greig Right Other Clinician: Referring Provider: Greig Right Treating Provider/Extender: Frann Rider in Treatment: 7 Subjective Chief Complaint Information obtained from Patient 05/04/16; Patient is here for review of a post surgical wound on the left medial ankle History of Present Illness (HPI) 05/04/16;  this is a 51 year old nondiabetic non-smoking man who is here for review of a wound on the left medial ankle. He has severe hypertension, severe obstructive sleep apnea on CPAP and morbid obesity. Is a problem apparently began in December with small veins that ruptured causing copious amounts of bleeding. He followed with Dr. [name] of [facility] underwent laser ablation in the left leg in January and on the right leg later on. He tolerated both of these procedures well. However he was admitted from 04/24/16 through 04/27/16 with cellulitis involving the left ankle. CT scan showed circumferential severe edema with posterior medial ankle fluid collection. He was given vancomycin and Rocephin and was seen by Dr. [name] and underwent an IandD of the area. Aspiration of the left ankle was negative. Culture showed MRSA and he was discharged on doxycycline which she is still taking and will remain on total next week. He was discharged with iodoform packing to the surgical wound and a Ace wrap that is being applied by his wife. He was treated with vancomycin in the hospital. The patient does not have a prior history of lower extremity wounds although he has chronic venous insufficiency with inflammation and secondary lymphedema. Echocardiogram done in the hospital showed an EF of 50-55%. Grade 2 diastolic dysfunction ABIs in this clinic were 1.03 on the right 0.9 on the left 05/11/16; patient arrives back in clinic today for review of a surgical area on the left medial ankle. He has completed his antibiotics there is no evidence of surrounding infection and no pain. We started silver alginate last week in place of iodoform packing. 05/18/16; the patient's wound is smaller in terms of the orifice and now as 1.3 cm depth versus 1.7 cm last week. He has completed his antibiotics. His insurance did not approve a Snap Vac. The wound is now too small for a standard [employer] electrical Vac. 05/25/16 continues to improve in  term of depth. 0.7x0.3x0.8. Ddp down by 0.5cm. original surgical wound 06/01/16; measuring slightly smaller. 0.7x0.3x0.6. still using collagen 06/08/16; filling in slowly. 0.7x0.3x0.4v still using collagen. Travelling to [place] next week 06/25/2016 -- the patient comes today as he has been traveling a lot and was unable to keep his previous appointments with Dr. Dellia Nims. He has not been wearing his compression stockings as advised and his dressing is not changed regularly as he has had a 4-layer compression applied. Jeffery Simon, Jeffery Simon. ([id number]) Objective Constitutional Pulse regular. Respirations normal and unlabored. Afebrile. Vitals Time Taken: 11:29 AM, Height: 71 in, Weight: 389 lbs, BMI: 54.2, Temperature: 98.3 F, Pulse: 75 bpm, Respiratory Rate: 18 breaths/min, Blood Pressure: 132/69  mmHg. Eyes Nonicteric. Reactive to light. Ears, Nose, Mouth, and Throat Lips, teeth, and gums WNL.Marland Kitchen Moist mucosa without lesions. Neck supple and nontender. No palpable supraclavicular or cervical adenopathy. Normal sized without goiter. Respiratory WNL. No retractions.. Breath sounds WNL, No rubs, rales, rhonchi, or wheeze.. Cardiovascular Heart rhythm and rate regular, no murmur or gallop.. Pedal Pulses WNL. No clubbing, cyanosis or edema. Chest Breasts symmetical and no nipple discharge.. Breast tissue WNL, no masses, lumps, or tenderness.. Lymphatic No adneopathy. No adenopathy. No adenopathy. Musculoskeletal Adexa without tenderness or enlargement.. Digits and nails w/o clubbing, cyanosis, infection, petechiae, ischemia, or inflammatory conditions.Marland Kitchen Psychiatric Judgement and insight Intact.. No evidence of depression, anxiety, or agitation.. General Notes: the wound on the left medial ankle has quite a significant amount of depth and has slough at the base which is difficult to remove with a curette because of the proximity to a lot of venous structures. He will benefit from the application of  Santyl ointment Integumentary (Hair, Skin) No suspicious lesions. No crepitus or fluctuance. No peri-wound warmth or erythema. No masses.. Wound #1 status is Open. Original cause of wound was Gradually Appeared. The wound is located on the Left,Medial Malleolus. The wound measures 0.7cm length x 0.4cm width x 0.3cm depth; 0.22cm^2 area and Simon, Jeffery M. ([id number]) 0.066cm^3 volume. There is Fat Layer (Subcutaneous Tissue) Exposed exposed. There is no tunneling or undermining noted. There is a medium amount of serosanguineous drainage noted. The wound margin is distinct with the outline attached to the wound base. There is large (67-100%) pink granulation within the wound bed. There is a small (1-33%) amount of necrotic tissue within the wound bed including Adherent Slough. The periwound skin appearance exhibited: Hemosiderin Staining, Mottled, Erythema. The periwound skin appearance did not exhibit: Callus, Crepitus, Excoriation, Induration, Rash, Scarring, Dry/Scaly, Maceration, Atrophie Blanche, Cyanosis, Ecchymosis, Pallor, Rubor. The surrounding wound skin color is noted with erythema which is circumferential. Periwound temperature was noted as No Abnormality. The periwound has tenderness on palpation. 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 Anesthetic: Wound #1 Left,Medial Malleolus: Topical Lidocaine 4% cream applied to wound bed prior to debridement Primary Wound Dressing: Wound #1 Left,Medial Malleolus: Santyl Ointment Secondary  Dressing: Wound #1 Left,Medial Malleolus: Dry Gauze Boardered Foam Dressing Dressing Change Frequency: Wound #1 Left,Medial Malleolus: Change dressing every day. Follow-up Appointments: Wound #1 Left,Medial Malleolus: Return Appointment in 1 week. Jeffery Simon, Jeffery Simon ([id number]) Edema Control: Wound #1 Left,Medial Malleolus: Patient to wear own compression stockings - Please get 30-23mmHg compression socks from [place] and wear them daily from morning until bedtime Elevate legs to the level of the heart and pump ankles as often as possible Additional Orders / Instructions: Wound #1 Left,Medial Malleolus: Increase protein intake. Activity as tolerated Medications-please add to medication list.: Wound #1 Left,Medial Malleolus: Santyl Enzymatic Ointment The following medication(s) was prescribed: Santyl topical 250 unit/gram ointment ointment topical as directed starting 06/25/2016 The base of the wound needs some chemical debridement and I have recommended Santyl ointment with a packing strip to be changed daily. If this is not affordable he can use [employer] He will wear his compression stockings of the 30-40 mm  variety and I have discussed at great length the way to do this. He will follow-up with Dr. Dellia Nims soon Electronic Signature(s) Signed: 06/25/2016 12:10:15 PM By: Christin Fudge MD, FACS Entered By: Christin Fudge on 06/25/2016 12:10:15 Jeffery Simon ([id number]) -------------------------------------------------------------------------------- SuperBill Details Patient Name: Jeffery Simon. Date of Service: 06/25/2016 Medical Record Number: [id number] Patient Account Number: [id number] Date of Birth/Sex: 01-05-1966 (51 y.o. 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

## 2016-06-27 NOTE — Progress Notes (Signed)
Jeffery Simon, Jeffery Simon ([id number]) Visit Report for 06/25/2016 Arrival Information Details Patient Name: Jeffery Simon, Jeffery Simon. Date of Service: 06/25/2016 11:15 AM Medical Record Number: [id number] Patient Account Number: [id number] Date of Birth/Sex: [birth year] (51 y.o. Male) Treating RN: Montey Hora Primary Care Bronda Alfred: Greig Right Other Clinician: Referring Lovette Merta: Greig Right Treating Cecylia Brazill/Extender: Frann Rider in Treatment: 7 Visit Information History Since Last Visit Added or deleted any medications: No Patient Arrived: Ambulatory Any new allergies or adverse reactions: No Arrival Time: 11:27 Had a fall or experienced change in No Accompanied By: self activities of daily living that may affect Transfer Assistance: None risk of falls: Patient Identification Verified: Yes Signs or symptoms of abuse/neglect since last No Secondary Verification Process Yes visito Completed: Hospitalized since last visit: No Patient Requires Transmission-Based No Has Dressing in Place as Prescribed: Yes Precautions: Has Compression in Place as Prescribed: Yes Patient Has Alerts: No Pain Present Now: No Electronic Signature(s) Signed: 06/25/2016 4:43:23 PM By: Montey Hora Entered By: Montey Hora on 06/25/2016 11:27:50 Jeffery Simon ([id number]) -------------------------------------------------------------------------------- Encounter Discharge Information Details Patient Name: Jeffery Simon. Date of Service: 06/25/2016 11:15 AM Medical Record Number: [id number] Patient Account Number: [id number] Date of Birth/Sex: 13-Jan-1966 (51 y.o. Male) Treating RN: Ahmed Prima Primary Care Azariel Banik: Greig Right Other Clinician: Referring Fabrice Dyal: Greig Right Treating Hannah Strader/Extender: Frann Rider in Treatment: 7 Encounter Discharge Information Items Discharge Pain Level: 0 Discharge Condition: Stable Ambulatory Status: Ambulatory Discharge Destination:  Home Transportation: Private Auto Accompanied By: self Schedule Follow-up Appointment: Yes Medication Reconciliation completed and provided to Patient/Care No Milledge Gerding: Provided on Clinical Summary of Care: 06/25/2016 Form Type Recipient Paper Patient HL Electronic Signature(s) Signed: 06/25/2016 12:03:00 PM By: Ruthine Dose Entered By: Ruthine Dose on 06/25/2016 12:02:59 Jeffery Simon ([id number]) -------------------------------------------------------------------------------- Lower Extremity Assessment Details Patient Name: Jeffery Simon. Date of Service: 06/25/2016 11:15 AM Medical Record Number: [id number] Patient Account Number: [id number] Date of Birth/Sex: [birth year] (51 y.o. Male) Treating RN: Montey Hora Primary Care Emre Stock: Greig Right Other Clinician: Referring Herbert Marken: Greig Right Treating Rahi Chandonnet/Extender: Frann Rider in Treatment: 7 Edema Assessment Assessed: [Left: No] [Right: No] E[Left: dema] [Right: :] Calf Left: Right: Point of Measurement: 38 cm From Medial Instep 48.2 cm cm Ankle Left: Right: Point of Measurement: 12 cm From Medial Instep 28.4 cm cm Vascular Assessment Pulses: Dorsalis Pedis Palpable: [Left:Yes] Posterior Tibial Extremity colors, hair growth, and conditions: Extremity Color: [Left:Normal] Temperature of Extremity: [Left:Warm] Capillary Refill: [Left:< 3 seconds] Toe Nail Assessment Left: Right: Thick: Yes Discolored: Yes Deformed: No Improper Length and Hygiene: Yes Electronic Signature(s) Signed: 06/25/2016 4:43:23 PM By: Montey Hora Entered By: Montey Hora on 06/25/2016 11:41:58 Jeffery Simon ([id number]) -------------------------------------------------------------------------------- Multi Wound Chart Details Patient Name: Jeffery Simon. Date of Service: 06/25/2016 11:15 AM Medical Record Number: [id number] Patient Account Number: [id number] Date of Birth/Sex: [birth year] (51 y.o.  Male) Treating RN: Ahmed Prima Primary Care Mollee Neer: Greig Right Other Clinician: Referring Melyna Huron: Greig Right Treating Juston Goheen/Extender: Frann Rider in Treatment: 7 Vital Signs Height(in): 71 Pulse(bpm): 75 Weight(lbs): 389 Blood Pressure 132/69 (mmHg): Body Mass Index(BMI): 54 Temperature(F): 98.3 Respiratory Rate 18 (breaths/min): Photos: [1:No Photos] [N/A:N/A] Wound Location: [1:Left Malleolus - Medial] [N/A:N/A] Wounding Event: [1:Gradually Appeared] [N/A:N/A] Primary Etiology: [1:Dehisced Wound] [N/A:N/A] Comorbid History: [1:Sleep Apnea, Hypertension] [N/A:N/A] Date Acquired: [1:04/25/2016] [N/A:N/A] Weeks of Treatment: [1:7] [N/A:N/A] Wound Status: [1:Open] [N/A:N/A] Measurements L x W x D 0.7x0.4x0.3 [N/A:N/A] (cm) Area (cm) : [1:0.22] [N/A:N/A] Volume (cm) : [1:0.066] [N/A:N/A] % Reduction in Area: [1:53.30%] [  N/A:N/A] % Reduction in Volume: 88.30% [N/A:N/A] Classification: [1:Full Thickness Without Exposed Support Structures] [N/A:N/A] Exudate Amount: [1:Medium] [N/A:N/A] Exudate Type: [1:Serosanguineous] [N/A:N/A] Exudate Color: [1:red, brown] [N/A:N/A] Wound Margin: [1:Distinct, outline attached] [N/A:N/A] Granulation Amount: [1:Large (67-100%)] [N/A:N/A] Granulation Quality: [1:Pink] [N/A:N/A] Necrotic Amount: [1:Small (1-33%)] [N/A:N/A] Exposed Structures: [1:Fat Layer (Subcutaneous Tissue) Exposed: Yes Fascia: No Tendon: No Muscle: No] [N/A:N/A] Joint: No Bone: No Epithelialization: None N/A N/A Periwound Skin Texture: Excoriation: No N/A N/A Induration: No Callus: No Crepitus: No Rash: No Scarring: No Periwound Skin Maceration: No N/A N/A Moisture: Dry/Scaly: No Periwound Skin Color: Erythema: Yes N/A N/A Hemosiderin Staining: Yes Mottled: Yes Atrophie Blanche: No Cyanosis: No Ecchymosis: No Pallor: No Rubor: No Erythema Location: Circumferential N/A N/A Temperature: No Abnormality N/A N/A Tenderness on Yes  N/A N/A Palpation: Wound Preparation: Ulcer Cleansing: Other: N/A N/A soap and water Topical Anesthetic Applied: None Treatment Notes Wound #1 (Left, Medial Malleolus) 1. Cleansed with: Clean wound with Normal Saline 2. Anesthetic Topical Lidocaine 4% cream to wound bed prior to debridement 3. Peri-wound Care: Skin Prep 4. Dressing Applied: Santyl Ointment 5. Secondary Dressing Applied Bordered Foam Dressing Dry Gauze Electronic Signature(s) Signed: 06/25/2016 12:06:59 PM By: Christin Fudge MD, FACS Entered By: Christin Fudge on 06/25/2016 12:06:59 Jeffery Simon ([id number] Simon ([id number]) -------------------------------------------------------------------------------- [place] Details Patient Name: Jeffery Simon, Jeffery Simon. Date of Service: 06/25/2016 11:15 AM Medical Record Number: [id number] Patient Account Number: [id number] Date of Birth/Sex: 11/03/1965 (51 y.o. Male) Treating RN: Montey Hora Primary Care Teoman Giraud: Greig Right Other Clinician: Referring Caress Reffitt: Greig Right Treating Ryne Mctigue/Extender: Frann Rider in Treatment: 7 Active Inactive ` Orientation to the Wound Care Program Nursing Diagnoses: Knowledge deficit related to the wound healing center program Goals: Patient/caregiver will verbalize understanding of the [place] Program Date Initiated: 05/04/2016 Target Resolution Date: 09/03/2016 Goal Status: Active Interventions: Provide education on orientation to the wound center Notes: ` Venous Leg Ulcer Nursing Diagnoses: Actual venous Insuffiency (use after diagnosis is confirmed) Potential for venous Insuffiency (use before diagnosis confirmed) Goals: Non-invasive venous studies are completed as ordered Date Initiated: 05/04/2016 Target Resolution Date: 09/03/2016 Goal Status: Active Patient will maintain optimal edema control Date Initiated: 05/04/2016 Target Resolution Date:  09/03/2016 Goal Status: Active Patient/caregiver will verbalize understanding of disease process and disease management Date Initiated: 05/04/2016 Target Resolution Date: 09/03/2016 Goal Status: Active Verify adequate tissue perfusion prior to therapeutic compression application Date Initiated: 05/04/2016 Target Resolution Date: 09/03/2016 Goal Status: Active Jeffery Simon, Jeffery Simon. ([id number]) Interventions: Assess peripheral edema status every visit. Compression as ordered Provide education on venous insufficiency Treatment Activities: Therapeutic compression applied : 05/04/2016 Notes: ` Wound/Skin Impairment Nursing Diagnoses: Impaired tissue integrity Knowledge deficit related to ulceration/compromised skin integrity Goals: Patient/caregiver will verbalize understanding of skin care regimen Date Initiated: 05/04/2016 Target Resolution Date: 09/03/2016 Goal Status: Active Ulcer/skin breakdown will have a volume reduction of 30% by week 4 Date Initiated: 05/04/2016 Target Resolution Date: 09/03/2016 Goal Status: Active Ulcer/skin breakdown will have a volume reduction of 50% by week 8 Date Initiated: 05/04/2016 Target Resolution Date: 09/03/2016 Goal Status: Active Ulcer/skin breakdown will have a volume reduction of 80% by week 12 Date Initiated: 05/04/2016 Target Resolution Date: 09/03/2016 Goal Status: Active Ulcer/skin breakdown will heal within 14 weeks Date Initiated: 05/04/2016 Target Resolution Date: 09/03/2016 Goal Status: Active Interventions: Assess patient/caregiver ability to obtain necessary supplies Assess patient/caregiver ability to perform ulcer/skin care regimen upon admission and as needed Assess ulceration(s) every visit Provide education on ulcer and skin care  Treatment Activities: Referred to DME Iantha Titsworth for dressing supplies : 05/04/2016 Skin care regimen initiated : 05/04/2016 Topical wound management initiated : 05/04/2016 Jeffery Simon, Jeffery Simon  ([id number]) Notes: Electronic Signature(s) Signed: 06/25/2016 4:43:23 PM By: Montey Hora Entered By: Montey Hora on 06/25/2016 11:44:38 Jeffery Simon ([id number]) -------------------------------------------------------------------------------- Pain Assessment Details Patient Name: Jeffery Simon. Date of Service: 06/25/2016 11:15 AM Medical Record Number: [id number] Patient Account Number: [id number] Date of Birth/Sex: 07/13/1965 (51 y.o. Male) Treating RN: Montey Hora Primary Care Kory Panjwani: Greig Right Other Clinician: Referring Jacalyn Biggs: Greig Right Treating Chinelo Benn/Extender: Frann Rider in Treatment: 7 Active Problems Location of Pain Severity and Description of Pain Patient Has Paino No Site Locations Pain Management and Medication Current Pain Management: Notes Topical or injectable lidocaine is offered to patient for acute pain when surgical debridement is performed. If needed, Patient is instructed to use over the counter pain medication for the following 24-48 hours after debridement. Wound care MDs do not prescribed pain medications. Patient has chronic pain or uncontrolled pain. Patient has been instructed to make an appointment with their Primary Care Physician for pain management. Electronic Signature(s) Signed: 06/25/2016 4:43:23 PM By: Montey Hora Entered By: Montey Hora on 06/25/2016 11:28:06 Jeffery Simon ([id number]) -------------------------------------------------------------------------------- Patient/Caregiver Education Details Patient Name: Jeffery Simon. Date of Service: 06/25/2016 11:15 AM Medical Record Number: [id number] Patient Account Number: [id number] Date of Birth/Gender: 02/12/1965 (51 y.o. Male) Treating RN: Ahmed Prima Primary Care Physician: Greig Right Other Clinician: Referring Physician: Greig Right Treating Physician/Extender: Frann Rider in Treatment: 7 Education  Assessment Education Provided To: Patient Education Topics Provided Wound/Skin Impairment: Handouts: Other: change dressing as ordered Methods: Demonstration, Explain/Verbal Responses: State content correctly Electronic Signature(s) Signed: 06/25/2016 4:23:26 PM By: Alric Quan Entered By: Alric Quan on 06/25/2016 11:44:47 Jeffery Simon ([id number]) -------------------------------------------------------------------------------- Wound Assessment Details Patient Name: Jeffery Simon. Date of Service: 06/25/2016 11:15 AM Medical Record Number: [id number] Patient Account Number: [id number] Date of Birth/Sex: [birth year] (51 y.o. Male) Treating RN: Montey Hora Primary Care Hubbard Seldon: Greig Right Other Clinician: Referring Dymin Dingledine: Greig Right Treating Jaxie Racanelli/Extender: Frann Rider in Treatment: 7 Wound Status Wound Number: 1 Primary Etiology: Dehisced Wound Wound Location: Left Malleolus - Medial Wound Status: Open Wounding Event: Gradually Appeared Comorbid History: Sleep Apnea, Hypertension Date Acquired: 04/25/2016 Weeks Of Treatment: 7 Clustered Wound: No Photos Photo Uploaded By: Montey Hora on 06/25/2016 15:14:47 Wound Measurements Length: (cm) 0.7 Width: (cm) 0.4 Depth: (cm) 0.3 Area: (cm) 0.22 Volume: (cm) 0.066 % Reduction in Area: 53.3% % Reduction in Volume: 88.3% Epithelialization: None Tunneling: No Undermining: No Wound Description Full Thickness Without Exposed Classification: Support Structures Wound Margin: Distinct, outline attached Exudate Medium Amount: Exudate Type: Serosanguineous Exudate Color: red, brown Foul Odor After Cleansing: No Slough/Fibrino Yes Wound Bed Granulation Amount: Large (67-100%) Exposed Structure Granulation Quality: Pink Fascia Exposed: No Necrotic Amount: Small (1-33%) Fat Layer (Subcutaneous Tissue) Exposed: Yes Jeffery Simon, Jeffery Simon ([id number]) Necrotic Quality: Adherent  Slough Tendon Exposed: No Muscle Exposed: No Joint Exposed: No Bone Exposed: No Periwound Skin Texture Texture Color No Abnormalities Noted: No No Abnormalities Noted: No Callus: No Atrophie Blanche: No Crepitus: No Cyanosis: No Excoriation: No Ecchymosis: No Induration: No Erythema: Yes Rash: No Erythema Location: Circumferential Scarring: No Hemosiderin Staining: Yes Mottled: Yes Moisture Pallor: No No Abnormalities Noted: No Rubor: No Dry / Scaly: No Maceration: No Temperature / Pain Temperature: No Abnormality Tenderness on Palpation: Yes Wound Preparation Ulcer Cleansing: Other: soap and water, Topical Anesthetic Applied: None Treatment Notes Wound #  1 (Left, Medial Malleolus) 1. Cleansed with: Clean wound with Normal Saline 2. Anesthetic Topical Lidocaine 4% cream to wound bed prior to debridement 3. Peri-wound Care: Skin Prep 4. Dressing Applied: Santyl Ointment 5. Secondary Dressing Applied Bordered Foam Dressing Dry Gauze Electronic Signature(s) Signed: 06/25/2016 4:43:23 PM By: Montey Hora Entered By: Montey Hora on 06/25/2016 11:41:02 Jeffery Simon ([id number]) -------------------------------------------------------------------------------- [place] Details Patient Name: Jeffery Simon. Date of Service: 06/25/2016 11:15 AM Medical Record Number: [id number] Patient Account Number: [id number] Date of Birth/Sex: 02/24/1965 (51 y.o. Male) Treating RN: Montey Hora Primary Care Cherina Dhillon: Greig Right Other Clinician: Referring Kaitlen Redford: Greig Right Treating Dayjah Selman/Extender: Frann Rider in Treatment: 7 Vital Signs Time Taken: 11:29 Temperature (F): 98.3 Height (in): 71 Pulse (bpm): 75 Weight (lbs): 389 Respiratory Rate (breaths/min): 18 Body Mass Index (BMI): 54.2 Blood Pressure (mmHg): 132/69 Reference Range: 80 - 120 mg / dl Electronic Signature(s) Signed: 06/25/2016 4:43:23 PM By: Montey Hora Entered By:  Montey Hora on 06/25/2016 11:30:57

## 2016-07-05 ENCOUNTER — Encounter: Payer: BLUE CROSS/BLUE SHIELD | Admitting: Surgery

## 2016-07-05 DIAGNOSIS — I10 Essential (primary) hypertension: Secondary | ICD-10-CM | POA: Diagnosis not present

## 2016-07-05 DIAGNOSIS — L97322 Non-pressure chronic ulcer of left ankle with fat layer exposed: Secondary | ICD-10-CM | POA: Diagnosis not present

## 2016-07-05 DIAGNOSIS — T8131XD Disruption of external operation (surgical) wound, not elsewhere classified, subsequent encounter: Secondary | ICD-10-CM | POA: Diagnosis not present

## 2016-07-05 DIAGNOSIS — L97323 Non-pressure chronic ulcer of left ankle with necrosis of muscle: Secondary | ICD-10-CM | POA: Diagnosis not present

## 2016-07-05 DIAGNOSIS — Z6841 Body Mass Index (BMI) 40.0 and over, adult: Secondary | ICD-10-CM | POA: Diagnosis not present

## 2016-07-05 DIAGNOSIS — T8189XA Other complications of procedures, not elsewhere classified, initial encounter: Secondary | ICD-10-CM | POA: Diagnosis not present

## 2016-07-05 DIAGNOSIS — G4733 Obstructive sleep apnea (adult) (pediatric): Secondary | ICD-10-CM | POA: Diagnosis not present

## 2016-07-05 DIAGNOSIS — I87332 Chronic venous hypertension (idiopathic) with ulcer and inflammation of left lower extremity: Secondary | ICD-10-CM | POA: Diagnosis not present

## 2016-07-05 DIAGNOSIS — I87312 Chronic venous hypertension (idiopathic) with ulcer of left lower extremity: Secondary | ICD-10-CM | POA: Diagnosis not present

## 2016-07-06 NOTE — Progress Notes (Signed)
MAREON, Simon ([id number]) Visit Report for 07/05/2016 Chief Complaint Document Details Patient Name: Jeffery Simon, Jeffery Simon. Date of Service: 07/05/2016 2:15 PM Medical Record Number: [id number] Patient Account Number: [id number] Date of Birth/Sex: 09/25/1965 (51 y.o. Male) Treating RN: Montey Hora Primary Care Provider: Greig Right Other Clinician: Referring Provider: Greig Right Treating Provider/Extender: Frann Rider in Treatment: 8 Information Obtained from: Patient Chief Complaint 05/04/16; Patient is here for review of a post surgical wound on the left medial ankle Electronic Signature(s) Signed: 07/05/2016 2:51:52 PM By: Christin Fudge MD, FACS Entered By: Christin Fudge on 07/05/2016 14:51:52 Jeffery Simon ([id number]) -------------------------------------------------------------------------------- HPI Details Patient Name: Jeffery Simon. Date of Service: 07/05/2016 2:15 PM Medical Record Number: [id number] Patient Account Number: [id number] Date of Birth/Sex: 01/26/1966 (51 y.o. Male) Treating RN: Montey Hora Primary Care Provider: Greig Right Other Clinician: Referring Provider: Greig Right Treating Provider/Extender: Frann Rider in Treatment: 8 History of Present Illness HPI Description: 05/04/16; this is a 51 year old nondiabetic non-smoking man who is here for review of a wound on the left medial ankle. He has severe hypertension, severe obstructive sleep apnea on CPAP and morbid obesity. Is a problem apparently began in December with small veins that ruptured causing copious amounts of bleeding. He followed with Dr. [name] of [facility] underwent laser ablation in the left leg in January and on the right leg later on. He tolerated both of these procedures well. However he was admitted from 04/24/16 through 04/27/16 with cellulitis involving the left ankle. CT scan showed circumferential severe edema with posterior medial ankle fluid collection.  He was given vancomycin and Rocephin and was seen by Dr. [name] and underwent an IandD of the area. Aspiration of the left ankle was negative. Culture showed MRSA and he was discharged on doxycycline which she is still taking and will remain on total next week. He was discharged with iodoform packing to the surgical wound and a Ace wrap that is being applied by his wife. He was treated with vancomycin in the hospital. The patient does not have a prior history of lower extremity wounds although he has chronic venous insufficiency with inflammation and secondary lymphedema. Echocardiogram done in the hospital showed an EF of 50-55%. Grade 2 diastolic dysfunction ABIs in this clinic were 1.03 on the right 0.9 on the left 05/11/16; patient arrives back in clinic today for review of a surgical area on the left medial ankle. He has completed his antibiotics there is no evidence of surrounding infection and no pain. We started silver alginate last week in place of iodoform packing. 05/18/16; the patient's wound is smaller in terms of the orifice and now as 1.3 cm depth versus 1.7 cm last week. He has completed his antibiotics. His insurance did not approve a Snap Vac. The wound is now too small for a standard [employer] electrical Vac. 05/25/16 continues to improve in term of depth. 0.7x0.3x0.8. Ddp down by 0.5cm. original surgical wound 06/01/16; measuring slightly smaller. 0.7x0.3x0.6. still using collagen 06/08/16; filling in slowly. 0.7x0.3x0.4v still using collagen. Travelling to [place] next week 06/25/2016 -- the patient comes today as he has been traveling a lot and was unable to keep his previous appointments with Dr. Dellia Nims. He has not been wearing his compression stockings as advised and his dressing is not changed regularly as he has had a 4-layer compression applied. 07/05/2016 -- he has been traveling a lot but has been able to do his dual layer compression and he is using Santyl. He has developed  a  new superficial ulceration on the anterior part of his left ankle. Electronic Signature(s) Signed: 07/05/2016 2:52:33 PM By: Christin Fudge MD, FACS Entered By: Christin Fudge on 07/05/2016 14:52:33 Jeffery Simon ([id number] Simon ([id number]) -------------------------------------------------------------------------------- Physical Exam Details Patient Name: Jeffery, Simon. Date of Service: 07/05/2016 2:15 PM Medical Record Number: [id number] Patient Account Number: [id number] Date of Birth/Sex: Mar 25, 1965 (51 y.o. Male) Treating RN: Montey Hora Primary Care Provider: Greig Right Other Clinician: Referring Provider: Greig Right Treating Provider/Extender: Frann Rider in Treatment: 8 Constitutional . Pulse regular. Respirations normal and unlabored. Afebrile. . Eyes Nonicteric. Reactive to light. Ears, Nose, Mouth, and Throat Lips, teeth, and gums WNL.Marland Kitchen Moist mucosa without lesions. Neck supple and nontender. No palpable supraclavicular or cervical adenopathy. Normal sized without goiter. Respiratory WNL. No retractions.. Breath sounds WNL, No rubs, rales, rhonchi, or wheeze.. Cardiovascular Heart rhythm and rate regular, no murmur or gallop.. Pedal Pulses WNL. No clubbing, cyanosis or edema. Chest Breasts symmetical and no nipple discharge.. Breast tissue WNL, no masses, lumps, or tenderness.. Lymphatic No adneopathy. No adenopathy. No adenopathy. Musculoskeletal Adexa without tenderness or enlargement.. Digits and nails w/o clubbing, cyanosis, infection, petechiae, ischemia, or inflammatory conditions.. Integumentary (Hair, Skin) No suspicious lesions. No crepitus or fluctuance. No peri-wound warmth or erythema. No masses.Marland Kitchen Psychiatric Judgement and insight Intact.. No evidence of depression, anxiety, or agitation.. Notes the wound on the left medial ankle is looking much cleaner today and no sharp debridement was done. We will continue to  use Santyl ointment locally. The new superficial ulceration on the anterior left ankle needs central for removal of some subcutaneous slough. There are a lot of dilated veins around this. Electronic Signature(s) Signed: 07/05/2016 2:53:15 PM By: Christin Fudge MD, FACS Entered By: Christin Fudge on 07/05/2016 14:53:15 Jeffery Simon ([id number]) -------------------------------------------------------------------------------- Physician Orders Details Patient Name: Jeffery Simon. Date of Service: 07/05/2016 2:15 PM Medical Record Number: [id number] Patient Account Number: [id number] Date of Birth/Sex: 1965/03/15 (52 y.o. Male) Treating RN: Montey Hora Primary Care Provider: Greig Right Other Clinician: Referring Provider: Greig Right Treating Provider/Extender: Frann Rider in Treatment: 8 Verbal / Phone Orders: No Diagnosis Coding Wound Cleansing Wound #1 Left,Medial Malleolus o Cleanse wound with mild soap and water o May Shower, gently pat wound dry prior to applying new dressing. Wound #2 Left,Anterior Ankle o Cleanse wound with mild soap and water o May Shower, gently pat wound dry prior to applying new dressing. Anesthetic Wound #1 Left,Medial Malleolus o Topical Lidocaine 4% cream applied to wound bed prior to debridement Wound #2 Left,Anterior Ankle o Topical Lidocaine 4% cream applied to wound bed prior to debridement Primary Wound Dressing Wound #1 Left,Medial Malleolus o Santyl Ointment Wound #2 Left,Anterior Ankle o Santyl Ointment Secondary Dressing Wound #1 Left,Medial Malleolus o Dry Gauze o Boardered Foam Dressing Wound #2 Left,Anterior Ankle o Dry Gauze o Boardered Foam Dressing Dressing Change Frequency Wound #1 Left,Medial Malleolus o Change dressing every day. KELVON, GIANNINI. ([id number]) Wound #2 Left,Anterior Ankle o Change dressing every day. Follow-up Appointments Wound #1 Left,Medial  Malleolus o Return Appointment in 1 week. Wound #2 Left,Anterior Ankle o Return Appointment in 1 week. Edema Control Wound #1 Left,Medial Malleolus o Patient to wear own compression stockings - Please get 30-61mmHg compression socks from [place] and wear them daily from morning until bedtime o Elevate legs to the level of the heart and pump ankles as often as possible Wound #2 Left,Anterior Ankle o Patient to wear own compression stockings -  Please get 30-66mmHg compression socks from [place] and wear them daily from morning until bedtime o Elevate legs to the level of the heart and pump ankles as often as possible Additional Orders / Instructions Wound #1 Left,Medial Malleolus o Increase protein intake. o Activity as tolerated Wound #2 Left,Anterior Ankle o Increase protein intake. o Activity as tolerated Medications-please add to medication list. Wound #1 Left,Medial Malleolus o Santyl Enzymatic Ointment Wound #2 Left,Anterior Ankle o Santyl Enzymatic Ointment Electronic Signature(s) Signed: 07/05/2016 4:03:37 PM By: Christin Fudge MD, FACS Signed: 07/05/2016 4:59:03 PM By: Montey Hora Entered By: Montey Hora on 07/05/2016 14:39:04 Jeffery Simon ([id number]) -------------------------------------------------------------------------------- Problem List Details Patient Name: ZORIAN, GUNDERMAN. Date of Service: 07/05/2016 2:15 PM Medical Record Number: [id number] Patient Account Number: [id number] Date of Birth/Sex: February 25, 1965 (51 y.o. 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 Date of Service: 07/05/2016 2:15 PM Medical Record Number: [id number] Patient Account Number: [id number] Date of Birth/Sex: Jun 25, 1965 (51 y.o. Male) Treating RN: Montey Hora Primary Care Provider: Greig Right Other Clinician: Referring Provider: Greig Right Treating Provider/Extender: Frann Rider in Treatment: 8 Subjective Chief Complaint Information obtained from Patient 05/04/16; Patient is here for review of a post surgical wound on the left medial ankle History of Present Illness (HPI) 05/04/16; this is a 51 year old nondiabetic non-smoking man who is here for review of a wound on the left medial ankle. He has severe hypertension, severe obstructive sleep apnea on CPAP and morbid obesity. Is a problem apparently began in December with small veins that ruptured causing copious amounts of bleeding. He followed with Dr. [name] of [facility] underwent laser ablation in the left leg in January and on the right leg later on. He tolerated both of these procedures well. However he was admitted from 04/24/16 through 04/27/16 with cellulitis involving the left ankle. CT scan showed circumferential severe edema with posterior medial ankle fluid collection. He was given vancomycin and Rocephin and was seen by Dr. [name] and underwent an IandD of the area. Aspiration of the left ankle was negative. Culture showed MRSA and he was discharged on doxycycline which she is still taking and  will remain on total next week. He was discharged with iodoform packing to the surgical wound and a Ace wrap that is being applied by his wife. He was treated with vancomycin in the hospital. The patient does not have a prior history of lower extremity wounds although he has chronic venous insufficiency with inflammation and secondary lymphedema. Echocardiogram done in the hospital showed an EF of 50-55%. Grade 2 diastolic dysfunction ABIs in this clinic were 1.03 on the right 0.9 on the left 05/11/16; patient arrives back in clinic today for review of a surgical area on the left medial ankle. He has completed his antibiotics there is no evidence of surrounding infection and no pain. We started silver alginate last week in place of iodoform packing. 05/18/16;  the patient's wound is smaller in terms of the orifice and now as 1.3 cm depth versus 1.7 cm last week. He has completed his antibiotics. His insurance did not approve a Snap Vac. The wound is now too small for a standard [employer] electrical Vac. 05/25/16 continues to improve in term of depth. 0.7x0.3x0.8. Ddp down by 0.5cm. original surgical wound 06/01/16; measuring slightly smaller. 0.7x0.3x0.6. still using collagen 06/08/16; filling in slowly. 0.7x0.3x0.4v still using collagen. Travelling to [place] next week 06/25/2016 -- the patient comes today as he has been traveling a lot and was unable to keep his previous appointments with Dr. Dellia Nims. He has not been wearing his compression stockings as advised and his dressing is not changed regularly as he has had a 4-layer compression applied. 07/05/2016 -- he has been traveling a lot but has been able to do his dual layer compression and he is using Santyl. He has developed a new superficial ulceration on the anterior part of his left ankle. MONTRELLE, EDDINGS. ([id number]) Objective Constitutional Pulse regular. Respirations normal and unlabored. Afebrile. Vitals Time Taken: 2:19 PM, Height: 71 in,  Weight: 389 lbs, BMI: 54.2, Temperature: 98.6 F, Pulse: 70 bpm, Respiratory Rate: 18 breaths/min, Blood Pressure: 121/60 mmHg. Eyes Nonicteric. Reactive to light. Ears, Nose, Mouth, and Throat Lips, teeth, and gums WNL.Marland Kitchen Moist mucosa without lesions. Neck supple and nontender. No palpable supraclavicular or cervical adenopathy. Normal sized without goiter. Respiratory WNL. No retractions.. Breath sounds WNL, No rubs, rales, rhonchi, or wheeze.. Cardiovascular Heart rhythm and rate regular, no murmur or gallop.. Pedal Pulses WNL. No clubbing, cyanosis or edema. Chest Breasts symmetical and no nipple discharge.. Breast tissue WNL, no masses, lumps, or tenderness.. Lymphatic No adneopathy. No adenopathy. No adenopathy. Musculoskeletal Adexa without tenderness or enlargement.. Digits and nails w/o clubbing, cyanosis, infection, petechiae, ischemia, or inflammatory conditions.Marland Kitchen Psychiatric Judgement and insight Intact.. No evidence of depression, anxiety, or agitation.. General Notes: the wound on the left medial ankle is looking much cleaner today and no sharp debridement was done. We will continue to use Santyl ointment locally. The new superficial ulceration on the anterior left ankle needs central for removal of some subcutaneous slough. There are a lot of dilated veins around this. Integumentary (Hair, Skin) No suspicious lesions. No crepitus or fluctuance. No peri-wound warmth or erythema. No masses.Marland Kitchen BRADON, FESTER. ([id number]) Wound #1 status is Open. Original cause of wound was Gradually Appeared. The wound is located on the Left,Medial Malleolus. The wound measures 0.7cm length x 0.4cm width x 0.5cm depth; 0.22cm^2 area and 0.11cm^3 volume. Wound #2 status is Open. Original cause of wound was Trauma. The wound is located on the Left,Anterior Ankle. The wound measures 1.4cm length x 1.2cm width x 0.1cm depth; 1.319cm^2 area and 0.132cm^3 volume. There is no tunneling or  undermining noted. There is a large amount of sanguinous drainage noted. The wound margin is flat and intact. There is medium (34-66%) red granulation within the wound bed. There is a medium (34-66%) amount of necrotic tissue within the wound bed including Adherent Slough. The periwound skin appearance did not exhibit: Callus, Crepitus, Excoriation, Induration, Rash, Scarring, Dry/Scaly, Maceration, Atrophie Blanche, Cyanosis, Ecchymosis, Hemosiderin Staining, Mottled, Pallor, Rubor, Erythema. Periwound temperature was noted as No Abnormality. The periwound has tenderness on palpation. Assessment Active Problems ICD-10 T81.31XD - Disruption of external operation (surgical) wound, not elsewhere classified, subsequent encounter I87.332 - Chronic venous hypertension (idiopathic) with ulcer and inflammation of left lower extremity L97.323 - Non-pressure chronic ulcer of left ankle  with necrosis of muscle Plan Wound Cleansing: Wound #1 Left,Medial Malleolus: Cleanse wound with mild soap and water May Shower, gently pat wound dry prior to applying new dressing. Wound #2 Left,Anterior Ankle: Cleanse wound with mild soap and water May Shower, gently pat wound dry prior to applying new dressing. Anesthetic: Wound #1 Left,Medial Malleolus: Topical Lidocaine 4% cream applied to wound bed prior to debridement Wound #2 Left,Anterior Ankle: Topical Lidocaine 4% cream applied to wound bed prior to debridement Primary Wound Dressing: Wound #1 Left,Medial Malleolus: Santyl Ointment MAXWELL, LEMEN ([id number]) Wound #2 Left,Anterior Ankle: Santyl Ointment Secondary Dressing: Wound #1 Left,Medial Malleolus: Dry Gauze Boardered Foam Dressing Wound #2 Left,Anterior Ankle: Dry Gauze Boardered Foam Dressing Dressing Change Frequency: Wound #1 Left,Medial Malleolus: Change dressing every day. Wound #2 Left,Anterior Ankle: Change dressing every day. Follow-up Appointments: Wound #1  Left,Medial Malleolus: Return Appointment in 1 week. Wound #2 Left,Anterior Ankle: Return Appointment in 1 week. Edema Control: Wound #1 Left,Medial Malleolus: Patient to wear own compression stockings - Please get 30-4mmHg compression socks from [place] and wear them daily from morning until bedtime Elevate legs to the level of the heart and pump ankles as often as possible Wound #2 Left,Anterior Ankle: Patient to wear own compression stockings - Please get 30-59mmHg compression socks from [place] and wear them daily from morning until bedtime Elevate legs to the level of the heart and pump ankles as often as possible Additional Orders / Instructions: Wound #1 Left,Medial Malleolus: Increase protein intake. Activity as tolerated Wound #2 Left,Anterior Ankle: Increase protein intake. 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 EDIS, HUISH ([id number]) We'll continue with Santyl ointment locally to both the wounds and hopefully by the time he comes back next to see Dr. Dellia Nims or me, we can make appropriate changes Electronic Signature(s) Signed: 07/05/2016 2:55:08 PM By: Christin Fudge MD, FACS Entered By: Christin Fudge on 07/05/2016 14:55:07 Jeffery Simon ([id number]) -------------------------------------------------------------------------------- [place] Details Patient Name: Jeffery Simon. Date of Service: 07/05/2016 Medical Record Number: [id number] Patient Account Number: [id number] Date of Birth/Sex: 10/12/1965 (51 y.o. 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 Facility Procedures CPT4 Code: [id number] Description: 99213 - WOUND CARE VISIT-LEV 3 EST PT Modifier: Quantity: 1 Physician Procedures CPT4: Description Modifier Quantity Code [id number] 99213 - WC PHYS LEVEL 3 - EST PT 1 ICD-10 Description Diagnosis T81.31XD Disruption of external operation (surgical) wound, not elsewhere classified, subsequent encounter I87.332 Chronic venous  hypertension (idiopathic) with ulcer and inflammation of left lower extremity 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

## 2016-07-06 NOTE — Progress Notes (Signed)
Jeffery Simon, Jeffery Simon ([id number]) Visit Report for 07/05/2016 Arrival Information Details Patient Name: Jeffery Simon, Jeffery Simon. Date of Service: 07/05/2016 2:15 PM Medical Record Number: [id number] Patient Account Number: [id number] Date of Birth/Sex: 17-Jan-1966 (51 y.o. Male) Treating RN: Montey Hora Primary Care Rogerick Baldwin: Greig Right Other Clinician: Referring Kacy Conely: Greig Right Treating Muskan Bolla/Extender: Frann Rider in Treatment: 8 Visit Information History Since Last Visit Added or deleted any medications: No Patient Arrived: Ambulatory Any new allergies or adverse reactions: No Arrival Time: 14:18 Had a fall or experienced change in No Accompanied By: spouse activities of daily living that may affect Transfer Assistance: None risk of falls: Patient Identification Verified: Yes Signs or symptoms of abuse/neglect since last No Secondary Verification Process Yes visito Completed: Hospitalized since last visit: No Patient Requires Transmission-Based No Has Dressing in Place as Prescribed: Yes Precautions: Has Compression in Place as Prescribed: Yes Patient Has Alerts: No Pain Present Now: No Electronic Signature(s) Signed: 07/05/2016 4:59:03 PM By: Montey Hora Entered By: Montey Hora on 07/05/2016 14:19:43 Jeffery Simon ([id number]) -------------------------------------------------------------------------------- Clinic Level of Care Assessment Details Patient Name: Jeffery Simon. Date of Service: 07/05/2016 2:15 PM Medical Record Number: [id number] Patient Account Number: [id number] Date of Birth/Sex: 1966/01/15 (51 y.o. Male) Treating RN: Montey Hora Primary Care Ravinder Hofland: Greig Right Other Clinician: Referring Adriaan Maltese: Greig Right Treating Sigmond Patalano/Extender: Frann Rider in Treatment: 8 Clinic Level of Care Assessment Items TOOL 4 Quantity Score []  - Use when only an EandM is performed on FOLLOW-UP visit 0 ASSESSMENTS -  Nursing Assessment / Reassessment X - Reassessment of Co-morbidities (includes updates in patient status) 1 10 X - Reassessment of Adherence to Treatment Plan 1 5 ASSESSMENTS - Wound and Skin Assessment / Reassessment []  - Simple Wound Assessment / Reassessment - one wound 0 X - Complex Wound Assessment / Reassessment - multiple wounds 2 5 []  - Dermatologic / Skin Assessment (not related to wound area) 0 ASSESSMENTS - Focused Assessment []  - Circumferential Edema Measurements - multi extremities 0 []  - Nutritional Assessment / Counseling / Intervention 0 X - Lower Extremity Assessment (monofilament, tuning fork, pulses) 1 5 []  - Peripheral Arterial Disease Assessment (using hand held doppler) 0 ASSESSMENTS - Ostomy and/or Continence Assessment and Care []  - Incontinence Assessment and Management 0 []  - Ostomy Care Assessment and Management (repouching, etc.) 0 PROCESS - Coordination of Care X - Simple Patient / Family Education for ongoing care 1 15 []  - Complex (extensive) Patient / Family Education for ongoing care 0 []  - Staff obtains Programmer, systems, Records, Test Results / Process Orders 0 []  - Staff telephones HHA, Nursing Homes / Clarify orders / etc 0 []  - Routine Transfer to another Facility (non-emergent condition) 0 Jeffery Simon ([id number]) []  - Routine Hospital Admission (non-emergent condition) 0 []  - New Admissions / Biomedical engineer / Ordering NPWT, Apligraf, etc. 0 []  - Emergency Hospital Admission (emergent condition) 0 X - Simple Discharge Coordination 1 10 []  - Complex (extensive) Discharge Coordination 0 PROCESS - Special Needs []  - Pediatric / Minor Patient Management 0 []  - Isolation Patient Management 0 []  - Hearing / Language / Visual special needs 0 []  - Assessment of Community assistance (transportation, D/C planning, etc.) 0 []  - Additional assistance / Altered mentation 0 []  - Support Surface(s) Assessment (bed, cushion, seat, etc.) 0 INTERVENTIONS  - Wound Cleansing / Measurement []  - Simple Wound Cleansing - one wound 0 X - Complex Wound Cleansing - multiple wounds 2 5 X - Wound Imaging (photographs - any  number of wounds) 1 5 []  - Wound Tracing (instead of photographs) 0 []  - Simple Wound Measurement - one wound 0 X - Complex Wound Measurement - multiple wounds 2 5 INTERVENTIONS - Wound Dressings X - Small Wound Dressing one or multiple wounds 2 10 []  - Medium Wound Dressing one or multiple wounds 0 []  - Large Wound Dressing one or multiple wounds 0 X - Application of Medications - topical 1 5 []  - Application of Medications - injection 0 INTERVENTIONS - Miscellaneous []  - External ear exam 0 Jeffery Simon. ([id number]) []  - Specimen Collection (cultures, biopsies, blood, body fluids, etc.) 0 []  - Specimen(s) / Culture(s) sent or taken to Lab for analysis 0 []  - Patient Transfer (multiple staff / Harrel Lemon Lift / Similar devices) 0 []  - Simple Staple / Suture removal (25 or less) 0 []  - Complex Staple / Suture removal (26 or more) 0 []  - Hypo / Hyperglycemic Management (close monitor of Blood Glucose) 0 []  - Ankle / Brachial Index (ABI) - do not check if billed separately 0 X - Vital Signs 1 5 Has the patient been seen at the hospital within the last three years: Yes Total Score: 110 Level Of Care: New/Established - Level 3 Electronic Signature(s) Signed: 07/05/2016 4:59:03 PM By: Montey Hora Entered By: Montey Hora on 07/05/2016 14:41:08 Jeffery Simon ([id number]) -------------------------------------------------------------------------------- Encounter Discharge Information Details Patient Name: Jeffery Simon. Date of Service: 07/05/2016 2:15 PM Medical Record Number: [id number] Patient Account Number: [id number] Date of Birth/Sex: July 31, 1965 (51 y.o. Male) Treating RN: Montey Hora Primary Care Yeudiel Mateo: Greig Right Other Clinician: Referring Dajane Valli: Greig Right Treating Taren Dymek/Extender:  Frann Rider in Treatment: 8 Encounter Discharge Information Items Discharge Pain Level: 0 Discharge Condition: Stable Ambulatory Status: Ambulatory Discharge Destination: Home Transportation: Private Auto Accompanied By: spouse Schedule Follow-up Appointment: Yes Medication Reconciliation completed and provided to Patient/Care No Analyssa Downs: Provided on Clinical Summary of Care: 07/05/2016 Form Type Recipient Paper Patient HL Electronic Signature(s) Signed: 07/05/2016 2:52:46 PM By: Ruthine Dose Entered By: Ruthine Dose on 07/05/2016 14:52:46 Jeffery Simon ([id number]) -------------------------------------------------------------------------------- Lower Extremity Assessment Details Patient Name: Jeffery Simon. Date of Service: 07/05/2016 2:15 PM Medical Record Number: [id number] Patient Account Number: [id number] Date of Birth/Sex: [birth year] (51 y.o. Male) Treating RN: Montey Hora Primary Care Oaklee Esther: Greig Right Other Clinician: Referring Efraim Vanallen: Greig Right Treating Devrin Monforte/Extender: Frann Rider in Treatment: 8 Edema Assessment Assessed: [Left: No] [Right: No] Edema: [Left: Ye] [Right: s] Calf Left: Right: Point of Measurement: 38 cm From Medial Instep 48.4 cm cm Ankle Left: Right: Point of Measurement: 12 cm From Medial Instep 28.8 cm cm Vascular Assessment Pulses: Dorsalis Pedis Palpable: [Left:Yes] Posterior Tibial Extremity colors, hair growth, and conditions: Extremity Color: [Left:Hyperpigmented] Hair Growth on Extremity: [Left:Yes] Temperature of Extremity: [Left:Warm] Capillary Refill: [Left:< 3 seconds] Electronic Signature(s) Signed: 07/05/2016 4:59:03 PM By: Montey Hora Entered By: Montey Hora on 07/05/2016 14:35:25 Jeffery Simon ([id number]) -------------------------------------------------------------------------------- Multi Wound Chart Details Patient Name: Jeffery Simon. Date of Service:  07/05/2016 2:15 PM Medical Record Number: [id number] Patient Account Number: [id number] Date of Birth/Sex: 12-11-1965 (51 y.o. Male) Treating RN: Montey Hora Primary Care Nakyla Bracco: Greig Right Other Clinician: Referring Lilyrose Tanney: Greig Right Treating Burnett Spray/Extender: Frann Rider in Treatment: 8 Vital Signs Height(in): 71 Pulse(bpm): 70 Weight(lbs): 389 Blood Pressure 121/60 (mmHg): Body Mass Index(BMI): 54 Temperature(F): 98.6 Respiratory Rate 18 (breaths/min): Photos: [1:No Photos] [2:No Photos] [N/A:N/A] Wound Location: [1:Left, Medial Malleolus] [2:Left Ankle - Anterior] [N/A:N/A] Wounding Event: [1:Gradually Appeared] [  2:Trauma] [N/A:N/A] Primary Etiology: [1:Dehisced Wound] [2:Skin Tear] [N/A:N/A] Comorbid History: [1:N/A] [2:Sleep Apnea, Hypertension] [N/A:N/A] Date Acquired: [1:04/25/2016] [2:07/01/2016] [N/A:N/A] Weeks of Treatment: [1:8] [2:0] [N/A:N/A] Wound Status: [1:Open] [2:Open] [N/A:N/A] Measurements L x W x D 0.7x0.4x0.5 [2:1.4x1.2x0.1] [N/A:N/A] (cm) Area (cm) : [1:0.22] [2:1.319] [N/A:N/A] Volume (cm) : [1:0.11] [2:0.132] [N/A:N/A] % Reduction in Area: [1:53.30%] [2:N/A] [N/A:N/A] % Reduction in Volume: 80.50% [2:N/A] [N/A:N/A] Classification: [1:Full Thickness Without Exposed Support Structures] [2:Partial Thickness] [N/A:N/A] Exudate Amount: [1:N/A] [2:Large] [N/A:N/A] Exudate Type: [1:N/A] [2:Sanguinous] [N/A:N/A] Exudate Color: [1:N/A] [2:red] [N/A:N/A] Wound Margin: [1:N/A] [2:Flat and Intact] [N/A:N/A] Granulation Amount: [1:N/A] [2:Medium (34-66%)] [N/A:N/A] Granulation Quality: [1:N/A] [2:Red] [N/A:N/A] Necrotic Amount: [1:N/A] [2:Medium (34-66%)] [N/A:N/A] Epithelialization: [1:N/A] [2:None] [N/A:N/A] Periwound Skin Texture: No Abnormalities Noted [2:Excoriation: No Induration: No Callus: No Crepitus: No] [N/A:N/A] Rash: No Scarring: No Periwound Skin No Abnormalities Noted Maceration: No N/A Moisture: Dry/Scaly:  No Periwound Skin Color: No Abnormalities Noted Atrophie Blanche: No N/A Cyanosis: No Ecchymosis: No Erythema: No Hemosiderin Staining: No Mottled: No Pallor: No Rubor: No Temperature: N/A No Abnormality N/A Tenderness on No Yes N/A Palpation: Wound Preparation: N/A Ulcer Cleansing: N/A Rinsed/Irrigated with Saline Topical Anesthetic Applied: Other: lidocaine 4% Treatment Notes Wound #1 (Left, Medial Malleolus) 1. Cleansed with: Clean wound with Normal Saline 2. Anesthetic Topical Lidocaine 4% cream to wound bed prior to debridement 3. Peri-wound Care: Skin Prep 4. Dressing Applied: Santyl Ointment 5. Secondary Dressing Applied Bordered Foam Dressing Dry Gauze Wound #2 (Left, Anterior Ankle) 1. Cleansed with: Clean wound with Normal Saline 2. Anesthetic Topical Lidocaine 4% cream to wound bed prior to debridement 3. Peri-wound Care: Skin Prep 4. Dressing Applied: Santyl Ointment 5. Secondary Dressing Applied Bordered Foam Dressing Jeffery Simon, Jeffery Simon ([id number]) Dry Gauze Electronic Signature(s) Signed: 07/05/2016 2:51:13 PM By: Christin Fudge MD, FACS Entered By: Christin Fudge on 07/05/2016 14:51:13 Jeffery Simon ([id number]) -------------------------------------------------------------------------------- [place] Details Patient Name: Jeffery Simon, Jeffery Simon. Date of Service: 07/05/2016 2:15 PM Medical Record Number: [id number] Patient Account Number: [id number] Date of Birth/Sex: 02-09-1965 (51 y.o. Male) Treating RN: Montey Hora Primary Care Dorrien Grunder: Greig Right Other Clinician: Referring Ivah Girardot: Greig Right Treating Yousof Alderman/Extender: Frann Rider in Treatment: 8 Active Inactive ` Orientation to the Wound Care Program Nursing Diagnoses: Knowledge deficit related to the wound healing center program Goals: Patient/caregiver will verbalize understanding of the [facility] Program Date Initiated:  05/04/2016 Target Resolution Date: 09/03/2016 Goal Status: Active Interventions: Provide education on orientation to the wound center Notes: ` Venous Leg Ulcer Nursing Diagnoses: Actual venous Insuffiency (use after diagnosis is confirmed) Potential for venous Insuffiency (use before diagnosis confirmed) Goals: Non-invasive venous studies are completed as ordered Date Initiated: 05/04/2016 Target Resolution Date: 09/03/2016 Goal Status: Active Patient will maintain optimal edema control Date Initiated: 05/04/2016 Target Resolution Date: 09/03/2016 Goal Status: Active Patient/caregiver will verbalize understanding of disease process and disease management Date Initiated: 05/04/2016 Target Resolution Date: 09/03/2016 Goal Status: Active Verify adequate tissue perfusion prior to therapeutic compression application Date Initiated: 05/04/2016 Target Resolution Date: 09/03/2016 Goal Status: Active Jeffery Simon, LITT. ([id number]) Interventions: Assess peripheral edema status every visit. Compression as ordered Provide education on venous insufficiency Treatment Activities: Therapeutic compression applied : 05/04/2016 Notes: ` Wound/Skin Impairment Nursing Diagnoses: Impaired tissue integrity Knowledge deficit related to ulceration/compromised skin integrity Goals: Patient/caregiver will verbalize understanding of skin care regimen Date Initiated: 05/04/2016 Target Resolution Date: 09/03/2016 Goal Status: Active Ulcer/skin breakdown will have a volume reduction of 30% by week 4 Date Initiated: 05/04/2016 Target Resolution Date: 09/03/2016  Goal Status: Active Ulcer/skin breakdown will have a volume reduction of 50% by week 8 Date Initiated: 05/04/2016 Target Resolution Date: 09/03/2016 Goal Status: Active Ulcer/skin breakdown will have a volume reduction of 80% by week 12 Date Initiated: 05/04/2016 Target Resolution Date: 09/03/2016 Goal Status: Active Ulcer/skin breakdown will heal  within 14 weeks Date Initiated: 05/04/2016 Target Resolution Date: 09/03/2016 Goal Status: Active Interventions: Assess patient/caregiver ability to obtain necessary supplies Assess patient/caregiver ability to perform ulcer/skin care regimen upon admission and as needed Assess ulceration(s) every visit Provide education on ulcer and skin care Treatment Activities: Referred to DME Keimora Swartout for dressing supplies : 05/04/2016 Skin care regimen initiated : 05/04/2016 Topical wound management initiated : 05/04/2016 Jeffery Simon, Jeffery Simon ([id number]) Notes: Electronic Signature(s) Signed: 07/05/2016 4:59:03 PM By: Montey Hora Entered By: Montey Hora on 07/05/2016 14:36:24 Jeffery Simon ([id number]) -------------------------------------------------------------------------------- Pain Assessment Details Patient Name: Jeffery Simon. Date of Service: 07/05/2016 2:15 PM Medical Record Number: [id number] Patient Account Number: [id number] Date of Birth/Sex: 1965-07-01 (51 y.o. Male) Treating RN: Montey Hora Primary Care Vieva Brummitt: Greig Right Other Clinician: Referring Caoilainn Sacks: Greig Right Treating Balen Woolum/Extender: Frann Rider in Treatment: 8 Active Problems Location of Pain Severity and Description of Pain Patient Has Paino No Site Locations Pain Management and Medication Current Pain Management: Notes Topical or injectable lidocaine is offered to patient for acute pain when surgical debridement is performed. If needed, Patient is instructed to use over the counter pain medication for the following 24-48 hours after debridement. Wound care MDs do not prescribed pain medications. Patient has chronic pain or uncontrolled pain. Patient has been instructed to make an appointment with their Primary Care Physician for pain management. Electronic Signature(s) Signed: 07/05/2016 4:59:03 PM By: Montey Hora Entered By: Montey Hora on 07/05/2016 14:19:50 Jeffery Simon ([id number]) -------------------------------------------------------------------------------- Patient/Caregiver Education Details Patient Name: Jeffery Simon. Date of Service: 07/05/2016 2:15 PM Medical Record Number: [id number] Patient Account Number: [id number] Date of Birth/Gender: 09/15/1965 (51 y.o. Male) Treating RN: Montey Hora Primary Care Physician: Greig Right Other Clinician: Referring Physician: Greig Right Treating Physician/Extender: Frann Rider in Treatment: 8 Education Assessment Education Provided To: Patient Education Topics Provided Venous: Handouts: Other: wound care as ordered Methods: Explain/Verbal Responses: State content correctly Wound/Skin Impairment: Handouts: Other: wound care as ordered Methods: Explain/Verbal Responses: State content correctly Electronic Signature(s) Signed: 07/05/2016 4:59:03 PM By: Montey Hora Entered By: Montey Hora on 07/05/2016 14:37:59 Jeffery Simon ([id number]) -------------------------------------------------------------------------------- Wound Assessment Details Patient Name: Jeffery Simon. Date of Service: 07/05/2016 2:15 PM Medical Record Number: [id number] Patient Account Number: [id number] Date of Birth/Sex: 11/23/1965 (51 y.o. Male) Treating RN: Montey Hora Primary Care Lenward Able: Greig Right Other Clinician: Referring Arbutus Nelligan: Greig Right Treating Norma Montemurro/Extender: Frann Rider in Treatment: 8 Wound Status Wound Number: 1 Primary Etiology: Dehisced Wound Wound Location: Left, Medial Malleolus Wound Status: Open Wounding Event: Gradually Appeared Date Acquired: 04/25/2016 Weeks Of Treatment: 8 Clustered Wound: No Photos Photo Uploaded By: Montey Hora on 07/05/2016 16:37:30 Wound Measurements Length: (cm) 0.7 Width: (cm) 0.4 Depth: (cm) 0.5 Area: (cm) 0.22 Volume: (cm) 0.11 % Reduction in Area: 53.3% % Reduction in Volume: 80.5% Wound  Description Full Thickness Without Exposed Classification: Support Structures Periwound Skin Texture Texture Color No Abnormalities Noted: No No Abnormalities Noted: No Moisture No Abnormalities Noted: No Treatment Notes Wound #1 (Left, Medial Malleolus) Sam, Maven M. ([id number]) 1. Cleansed with: Clean wound with Normal Saline 2. Anesthetic Topical Lidocaine 4% cream to wound bed prior to debridement 3. Peri-wound Care:  Skin Prep 4. Dressing Applied: Santyl Ointment 5. Secondary Dressing Applied Bordered Foam Dressing Dry Gauze Electronic Signature(s) Signed: 07/05/2016 4:59:03 PM By: Montey Hora Entered By: Montey Hora on 07/05/2016 14:29:32 Jeffery Simon ([id number]) -------------------------------------------------------------------------------- Wound Assessment Details Patient Name: Jeffery Simon. Date of Service: 07/05/2016 2:15 PM Medical Record Number: [id number] Patient Account Number: [id number] Date of Birth/Sex: 07-24-1965 (51 y.o. Male) Treating RN: Montey Hora Primary Care Draven Natter: Greig Right Other Clinician: Referring Loura Pitt: Greig Right Treating Nita Whitmire/Extender: Frann Rider in Treatment: 8 Wound Status Wound Number: 2 Primary Etiology: Skin Tear Wound Location: Left Ankle - Anterior Wound Status: Open Wounding Event: Trauma Comorbid History: Sleep Apnea, Hypertension Date Acquired: 07/01/2016 Weeks Of Treatment: 0 Clustered Wound: No Photos Photo Uploaded By: Montey Hora on 07/05/2016 16:38:03 Wound Measurements Length: (cm) 1.4 Width: (cm) 1.2 Depth: (cm) 0.1 Area: (cm) 1.319 Volume: (cm) 0.132 % Reduction in Area: % Reduction in Volume: Epithelialization: None Tunneling: No Undermining: No Wound Description Classification: Partial Thickness Wound Margin: Flat and Intact Exudate Amount: Large Exudate Type: Sanguinous Exudate Color: red Foul Odor After Cleansing: No Slough/Fibrino  Yes Wound Bed Granulation Amount: Medium (34-66%) Exposed Structure Granulation Quality: Red Fascia Exposed: No Necrotic Amount: Medium (34-66%) Fat Layer (Subcutaneous Tissue) Exposed: No Necrotic Quality: Adherent Slough Tendon Exposed: No Jeffery Simon, Jeffery Simon. ([id number]) Muscle Exposed: No Joint Exposed: No Bone Exposed: No Periwound Skin Texture Texture Color No Abnormalities Noted: No No Abnormalities Noted: No Callus: No Atrophie Blanche: No Crepitus: No Cyanosis: No Excoriation: No Ecchymosis: No Induration: No Erythema: No Rash: No Hemosiderin Staining: No Scarring: No Mottled: No Pallor: No Moisture Rubor: No No Abnormalities Noted: No Dry / Scaly: No Temperature / Pain Maceration: No Temperature: No Abnormality Tenderness on Palpation: Yes Wound Preparation Ulcer Cleansing: Rinsed/Irrigated with Saline Topical Anesthetic Applied: Other: lidocaine 4%, Treatment Notes Wound #2 (Left, Anterior Ankle) 1. Cleansed with: Clean wound with Normal Saline 2. Anesthetic Topical Lidocaine 4% cream to wound bed prior to debridement 3. Peri-wound Care: Skin Prep 4. Dressing Applied: Santyl Ointment 5. Secondary Dressing Applied Bordered Foam Dressing Dry Gauze Electronic Signature(s) Signed: 07/05/2016 4:59:03 PM By: Montey Hora Entered By: Montey Hora on 07/05/2016 14:31:55 Jeffery Simon ([id number]) -------------------------------------------------------------------------------- [place] Details Patient Name: Jeffery Simon. Date of Service: 07/05/2016 2:15 PM Medical Record Number: [id number] Patient Account Number: [id number] Date of Birth/Sex: April 05, 1965 (51 y.o. Male) Treating RN: Montey Hora Primary Care Atiyah Bauer: Greig Right Other Clinician: Referring Sarita Hakanson: Greig Right Treating Christinea Brizuela/Extender: Frann Rider in Treatment: 8 Vital Signs Time Taken: 14:19 Temperature (F): 98.6 Height (in): 71 Pulse (bpm):  70 Weight (lbs): 389 Respiratory Rate (breaths/min): 18 Body Mass Index (BMI): 54.2 Blood Pressure (mmHg): 121/60 Reference Range: 80 - 120 mg / dl Electronic Signature(s) Signed: 07/05/2016 4:59:03 PM By: Montey Hora Entered By: Montey Hora on 07/05/2016 14:21:14

## 2016-07-12 ENCOUNTER — Encounter: Payer: BLUE CROSS/BLUE SHIELD | Admitting: Surgery

## 2016-07-12 DIAGNOSIS — I87332 Chronic venous hypertension (idiopathic) with ulcer and inflammation of left lower extremity: Secondary | ICD-10-CM | POA: Diagnosis not present

## 2016-07-12 DIAGNOSIS — L97323 Non-pressure chronic ulcer of left ankle with necrosis of muscle: Secondary | ICD-10-CM | POA: Diagnosis not present

## 2016-07-12 DIAGNOSIS — T8189XA Other complications of procedures, not elsewhere classified, initial encounter: Secondary | ICD-10-CM | POA: Diagnosis not present

## 2016-07-12 DIAGNOSIS — Z6841 Body Mass Index (BMI) 40.0 and over, adult: Secondary | ICD-10-CM | POA: Diagnosis not present

## 2016-07-12 DIAGNOSIS — T8131XD Disruption of external operation (surgical) wound, not elsewhere classified, subsequent encounter: Secondary | ICD-10-CM | POA: Diagnosis not present

## 2016-07-12 DIAGNOSIS — G4733 Obstructive sleep apnea (adult) (pediatric): Secondary | ICD-10-CM | POA: Diagnosis not present

## 2016-07-12 DIAGNOSIS — I10 Essential (primary) hypertension: Secondary | ICD-10-CM | POA: Diagnosis not present

## 2016-07-12 DIAGNOSIS — L97322 Non-pressure chronic ulcer of left ankle with fat layer exposed: Secondary | ICD-10-CM | POA: Diagnosis not present

## 2016-07-13 NOTE — Progress Notes (Signed)
Jeffery Simon, Jeffery Simon ([id number]) Visit Report for 07/12/2016 Arrival Information Details Patient Name: Jeffery Simon, Jeffery Simon. Date of Service: 07/12/2016 1:30 PM Medical Record Number: [id number] Patient Account Number: [id number] Date of Birth/Sex: [birth year] (51 y.o. Male) Treating RN: Montey Hora Primary Care Areya Lemmerman: Greig Right Other Clinician: Referring Elleanor Guyett: Greig Right Treating Zuri Bradway/Extender: Frann Rider in Treatment: 9 Visit Information History Since Last Visit Added or deleted any medications: No Patient Arrived: Ambulatory Any new allergies or adverse reactions: No Arrival Time: 13:36 Had a fall or experienced change in No Accompanied By: self activities of daily living that may affect Transfer Assistance: None risk of falls: Patient Identification Verified: Yes Signs or symptoms of abuse/neglect since last No Secondary Verification Process Yes visito Completed: Hospitalized since last visit: No Patient Requires Transmission-Based No Has Dressing in Place as Prescribed: Yes Precautions: Has Compression in Place as Prescribed: Yes Patient Has Alerts: No Pain Present Now: No Electronic Signature(s) Signed: 07/12/2016 4:28:06 PM By: Montey Hora Entered By: Montey Hora on 07/12/2016 13:36:25 Jeffery Simon ([id number]) -------------------------------------------------------------------------------- Clinic Level of Care Assessment Details Patient Name: Jeffery Simon. Date of Service: 07/12/2016 1:30 PM Medical Record Number: [id number] Patient Account Number: [id number] Date of Birth/Sex: 11-09-1965 (51 y.o. Male) Treating RN: Montey Hora Primary Care Sanjay Broadfoot: Greig Right Other Clinician: Referring Jenisha Faison: Greig Right Treating Dovber Ernest/Extender: Frann Rider in Treatment: 9 Clinic Level of Care Assessment Items TOOL 4 Quantity Score []  - Use when only an EandM is performed on FOLLOW-UP visit 0 ASSESSMENTS -  Nursing Assessment / Reassessment X - Reassessment of Co-morbidities (includes updates in patient status) 1 10 X - Reassessment of Adherence to Treatment Plan 1 5 ASSESSMENTS - Wound and Skin Assessment / Reassessment []  - Simple Wound Assessment / Reassessment - one wound 0 X - Complex Wound Assessment / Reassessment - multiple wounds 2 5 []  - Dermatologic / Skin Assessment (not related to wound area) 0 ASSESSMENTS - Focused Assessment []  - Circumferential Edema Measurements - multi extremities 0 []  - Nutritional Assessment / Counseling / Intervention 0 X - Lower Extremity Assessment (monofilament, tuning fork, pulses) 1 5 []  - Peripheral Arterial Disease Assessment (using hand held doppler) 0 ASSESSMENTS - Ostomy and/or Continence Assessment and Care []  - Incontinence Assessment and Management 0 []  - Ostomy Care Assessment and Management (repouching, etc.) 0 PROCESS - Coordination of Care X - Simple Patient / Family Education for ongoing care 1 15 []  - Complex (extensive) Patient / Family Education for ongoing care 0 []  - Staff obtains Programmer, systems, Records, Test Results / Process Orders 0 []  - Staff telephones HHA, Nursing Homes / Clarify orders / etc 0 []  - Routine Transfer to another Facility (non-emergent condition) 0 OTTIE, TILLERY ([id number]) []  - Routine Hospital Admission (non-emergent condition) 0 []  - New Admissions / Biomedical engineer / Ordering NPWT, Apligraf, etc. 0 []  - Emergency Hospital Admission (emergent condition) 0 X - Simple Discharge Coordination 1 10 []  - Complex (extensive) Discharge Coordination 0 PROCESS - Special Needs []  - Pediatric / Minor Patient Management 0 []  - Isolation Patient Management 0 []  - Hearing / Language / Visual special needs 0 []  - Assessment of Community assistance (transportation, D/C planning, etc.) 0 []  - Additional assistance / Altered mentation 0 []  - Support Surface(s) Assessment (bed, cushion, seat, etc.) 0 INTERVENTIONS  - Wound Cleansing / Measurement []  - Simple Wound Cleansing - one wound 0 X - Complex Wound Cleansing - multiple wounds 2 5 X - Wound Imaging (photographs - any  number of wounds) 1 5 []  - Wound Tracing (instead of photographs) 0 []  - Simple Wound Measurement - one wound 0 X - Complex Wound Measurement - multiple wounds 2 5 INTERVENTIONS - Wound Dressings X - Small Wound Dressing one or multiple wounds 2 10 []  - Medium Wound Dressing one or multiple wounds 0 []  - Large Wound Dressing one or multiple wounds 0 []  - Application of Medications - topical 0 []  - Application of Medications - injection 0 INTERVENTIONS - Miscellaneous []  - External ear exam 0 MAYES, SANGIOVANNI. ([id number]) []  - Specimen Collection (cultures, biopsies, blood, body fluids, etc.) 0 []  - Specimen(s) / Culture(s) sent or taken to Lab for analysis 0 []  - Patient Transfer (multiple staff / Harrel Lemon Lift / Similar devices) 0 []  - Simple Staple / Suture removal (25 or less) 0 []  - Complex Staple / Suture removal (26 or more) 0 []  - Hypo / Hyperglycemic Management (close monitor of Blood Glucose) 0 []  - Ankle / Brachial Index (ABI) - do not check if billed separately 0 X - Vital Signs 1 5 Has the patient been seen at the hospital within the last three years: Yes Total Score: 105 Level Of Care: New/Established - Level 3 Electronic Signature(s) Signed: 07/12/2016 4:28:06 PM By: Montey Hora Entered By: Montey Hora on 07/12/2016 15:23:26 Jeffery Simon ([id number]) -------------------------------------------------------------------------------- Encounter Discharge Information Details Patient Name: Jeffery Simon. Date of Service: 07/12/2016 1:30 PM Medical Record Number: [id number] Patient Account Number: [id number] Date of Birth/Sex: Jun 05, 1965 (51 y.o. Male) Treating RN: Montey Hora Primary Care Radek Carnero: Greig Right Other Clinician: Referring Oluwadamilola Rosamond: Greig Right Treating Albena Comes/Extender: Frann Rider in Treatment: 9 Encounter Discharge Information Items Discharge Pain Level: 0 Discharge Condition: Stable Ambulatory Status: Ambulatory Discharge Destination: Home Transportation: Private Auto Accompanied By: self Schedule Follow-up Appointment: Yes Medication Reconciliation completed No and provided to Patient/Care Bartlett Enke: Patient Clinical Summary of Care: Declined Electronic Signature(s) Signed: 07/12/2016 2:10:00 PM By: Ruthine Dose Entered By: Ruthine Dose on 07/12/2016 14:10:00 Jeffery Simon ([id number]) -------------------------------------------------------------------------------- Lower Extremity Assessment Details Patient Name: Jeffery Simon. Date of Service: 07/12/2016 1:30 PM Medical Record Number: [id number] Patient Account Number: [id number] Date of Birth/Sex: Jul 19, 1965 (51 y.o. Male) Treating RN: Montey Hora Primary Care Bernice Mullin: Greig Right Other Clinician: Referring Rashaan Wyles: Greig Right Treating Jericka Kadar/Extender: Frann Rider in Treatment: 9 Edema Assessment Assessed: [Left: No] [Right: No] E[Left: dema] [Right: :] Calf Left: Right: Point of Measurement: 38 cm From Medial Instep cm cm Ankle Left: Right: Point of Measurement: 12 cm From Medial Instep cm cm Vascular Assessment Pulses: Dorsalis Pedis Palpable: [Left:Yes] Posterior Tibial Extremity colors, hair growth, and conditions: Extremity Color: [Left:Hyperpigmented] Hair Growth on Extremity: [Left:Yes] Temperature of Extremity: [Left:Warm] Capillary Refill: [Left:< 3 seconds] Toe Nail Assessment Left: Right: Thick: Yes Discolored: Yes Deformed: Yes Improper Length and Hygiene: No Electronic Signature(s) Signed: 07/12/2016 4:28:06 PM By: Montey Hora Entered By: Montey Hora on 07/12/2016 13:53:15 Jeffery Simon ([id number]) -------------------------------------------------------------------------------- Multi Wound Chart Details Patient  Name: Jeffery Simon. Date of Service: 07/12/2016 1:30 PM Medical Record Number: [id number] Patient Account Number: [id number] Date of Birth/Sex: 25-Nov-1965 (51 y.o. Male) Treating RN: Montey Hora Primary Care Masen Luallen: Greig Right Other Clinician: Referring Durwood Dittus: Greig Right Treating Nishika Parkhurst/Extender: Frann Rider in Treatment: 9 Vital Signs Height(in): 71 Pulse(bpm): 79 Weight(lbs): 389 Blood Pressure 120/61 (mmHg): Body Mass Index(BMI): 54 Temperature(F): 98.4 Respiratory Rate 18 (breaths/min): Photos: [1:No Photos] [2:No Photos] [N/A:N/A] Wound Location: [1:Left Malleolus - Medial] [2:Left Ankle -  Anterior] [N/A:N/A] Wounding Event: [1:Gradually Appeared] [2:Trauma] [N/A:N/A] Primary Etiology: [1:Dehisced Wound] [2:Skin Tear] [N/A:N/A] Comorbid History: [1:Sleep Apnea, Hypertension] [2:Sleep Apnea, Hypertension] [N/A:N/A] Date Acquired: [1:04/25/2016] [2:07/01/2016] [N/A:N/A] Weeks of Treatment: [1:9] [2:1] [N/A:N/A] Wound Status: [1:Open] [2:Open] [N/A:N/A] Measurements L x W x D 0.7x0.4x0.5 [2:1x1x0.1] [N/A:N/A] (cm) Area (cm) : [1:0.22] [2:0.785] [N/A:N/A] Volume (cm) : [1:0.11] [2:0.079] [N/A:N/A] % Reduction in Area: [1:53.30%] [2:40.50%] [N/A:N/A] % Reduction in Volume: 80.50% [2:40.20%] [N/A:N/A] Classification: [1:Full Thickness Without Exposed Support Structures] [2:Partial Thickness] [N/A:N/A] Exudate Amount: [1:Large] [2:Large] [N/A:N/A] Exudate Type: [1:Serous] [2:Sanguinous] [N/A:N/A] Exudate Color: [1:amber] [2:red] [N/A:N/A] Wound Margin: [1:Epibole] [2:Flat and Intact] [N/A:N/A] Granulation Amount: [1:Large (67-100%)] [2:Medium (34-66%)] [N/A:N/A] Granulation Quality: [1:Pink] [2:Red] [N/A:N/A] Necrotic Amount: [1:Small (1-33%)] [2:Medium (34-66%)] [N/A:N/A] Exposed Structures: [1:Fascia: No Fat Layer (Subcutaneous Tissue) Exposed: No Tendon: No Muscle: No] [2:Fascia: No Fat Layer (Subcutaneous Tissue) Exposed: No Tendon: No  Muscle: No] [N/A:N/A] Joint: No Joint: No Bone: No Bone: No Epithelialization: None None N/A Periwound Skin Texture: Excoriation: No Excoriation: No N/A Induration: No Induration: No Callus: No Callus: No Crepitus: No Crepitus: No Rash: No Rash: No Scarring: No Scarring: No Periwound Skin Maceration: No Maceration: No N/A Moisture: Dry/Scaly: No Dry/Scaly: No Periwound Skin Color: Ecchymosis: Yes Atrophie Blanche: No N/A Atrophie Blanche: No Cyanosis: No Cyanosis: No Ecchymosis: No Erythema: No Erythema: No Hemosiderin Staining: No Hemosiderin Staining: No Mottled: No Mottled: No Pallor: No Pallor: No Rubor: No Rubor: No Temperature: No Abnormality No Abnormality N/A Tenderness on No Yes N/A Palpation: Wound Preparation: Ulcer Cleansing: Ulcer Cleansing: N/A Rinsed/Irrigated with Rinsed/Irrigated with Saline Saline Topical Anesthetic Topical Anesthetic Applied: Other: lidocaine Applied: Other: lidocaine 4% 4% Treatment Notes Electronic Signature(s) Signed: 07/12/2016 2:07:56 PM By: Christin Fudge MD, FACS Entered By: Christin Fudge on 07/12/2016 14:07:56 Jeffery Simon ([id number]) -------------------------------------------------------------------------------- [place] Details Patient Name: Jeffery Simon. Date of Service: 07/12/2016 1:30 PM Medical Record Number: [id number] Patient Account Number: [id number] Date of Birth/Sex: 11-Sep-1965 (51 y.o. Male) Treating RN: Montey Hora Primary Care Cassaundra Rasch: Greig Right Other Clinician: Referring Bronc Brosseau: Greig Right Treating Cole Klugh/Extender: Frann Rider in Treatment: 9 Active Inactive ` Orientation to the Wound Care Program Nursing Diagnoses: Knowledge deficit related to the wound healing center program Goals: Patient/caregiver will verbalize understanding of the [facility] Program Date Initiated: 05/04/2016 Target Resolution Date: 09/03/2016 Goal  Status: Active Interventions: Provide education on orientation to the wound center Notes: ` Venous Leg Ulcer Nursing Diagnoses: Actual venous Insuffiency (use after diagnosis is confirmed) Potential for venous Insuffiency (use before diagnosis confirmed) Goals: Non-invasive venous studies are completed as ordered Date Initiated: 05/04/2016 Target Resolution Date: 09/03/2016 Goal Status: Active Patient will maintain optimal edema control Date Initiated: 05/04/2016 Target Resolution Date: 09/03/2016 Goal Status: Active Patient/caregiver will verbalize understanding of disease process and disease management Date Initiated: 05/04/2016 Target Resolution Date: 09/03/2016 Goal Status: Active Verify adequate tissue perfusion prior to therapeutic compression application Date Initiated: 05/04/2016 Target Resolution Date: 09/03/2016 Goal Status: Active HARM, JOU. ([id number]) Interventions: Assess peripheral edema status every visit. Compression as ordered Provide education on venous insufficiency Treatment Activities: Therapeutic compression applied : 05/04/2016 Notes: ` Wound/Skin Impairment Nursing Diagnoses: Impaired tissue integrity Knowledge deficit related to ulceration/compromised skin integrity Goals: Patient/caregiver will verbalize understanding of skin care regimen Date Initiated: 05/04/2016 Target Resolution Date: 09/03/2016 Goal Status: Active Ulcer/skin breakdown will have a volume reduction of 30% by week 4 Date Initiated: 05/04/2016 Target Resolution Date: 09/03/2016 Goal Status: Active Ulcer/skin breakdown will have a volume reduction of  50% by week 8 Date Initiated: 05/04/2016 Target Resolution Date: 09/03/2016 Goal Status: Active Ulcer/skin breakdown will have a volume reduction of 80% by week 12 Date Initiated: 05/04/2016 Target Resolution Date: 09/03/2016 Goal Status: Active Ulcer/skin breakdown will heal within 14 weeks Date Initiated: 05/04/2016 Target  Resolution Date: 09/03/2016 Goal Status: Active Interventions: Assess patient/caregiver ability to obtain necessary supplies Assess patient/caregiver ability to perform ulcer/skin care regimen upon admission and as needed Assess ulceration(s) every visit Provide education on ulcer and skin care Treatment Activities: Referred to DME Myshawn Chir[place] for dressing supplies : 05/04/2016 Skin care regimen initiated : 05/04/2016 Topical wound management initiated : 05/04/2016 OAKLYN, MANS ([id number]) Notes: Electronic Signature(s) Signed: 07/12/2016 4:28:06 PM By: Montey Hora Entered By: Montey Hora on 07/12/2016 13:53:22 Jeffery Simon ([id number]) -------------------------------------------------------------------------------- Pain Assessment Details Patient Name: Jeffery Simon. Date of Service: 07/12/2016 1:30 PM Medical Record Number: [id number] Patient Account Number: [id number] Date of Birth/Sex: February 28, 1965 (51 y.o. Male) Treating RN: Montey Hora Primary Care Encarnacion Scioneaux: Greig Right Other Clinician: Referring Miasha Emmons: Greig Right Treating Patryce Depriest/Extender: Frann Rider in Treatment: 9 Active Problems Location of Pain Severity and Description of Pain Patient Has Paino No Site Locations Pain Management and Medication Current Pain Management: Notes Topical or injectable lidocaine is offered to patient for acute pain when surgical debridement is performed. If needed, Patient is instructed to use over the counter pain medication for the following 24-48 hours after debridement. Wound care MDs do not prescribed pain medications. Patient has chronic pain or uncontrolled pain. Patient has been instructed to make an appointment with their Primary Care Physician for pain management. Electronic Signature(s) Signed: 07/12/2016 4:28:06 PM By: Montey Hora Entered By: Montey Hora on 07/12/2016 13:36:33 Jeffery Simon  ([id number]) -------------------------------------------------------------------------------- Patient/Caregiver Education Details Patient Name: Jeffery Simon. Date of Service: 07/12/2016 1:30 PM Medical Record Number: [id number] Patient Account Number: [id number] Date of Birth/Gender: 1965-03-22 (51 y.o. Male) Treating RN: Montey Hora Primary Care Physician: Greig Right Other Clinician: Referring Physician: Greig Right Treating Physician/Extender: Frann Rider in Treatment: 9 Education Assessment Education Provided To: Patient Education Topics Provided Wound/Skin Impairment: Handouts: Other: wound care as ordered Methods: Demonstration, Explain/Verbal Responses: State content correctly Electronic Signature(s) Signed: 07/12/2016 4:28:06 PM By: Montey Hora Entered By: Montey Hora on 07/12/2016 13:55:34 Jeffery Simon ([id number]) -------------------------------------------------------------------------------- Wound Assessment Details Patient Name: Jeffery Simon. Date of Service: 07/12/2016 1:30 PM Medical Record Number: [id number] Patient Account Number: [id number] Date of Birth/Sex: [birth year] (51 y.o. Male) Treating RN: Montey Hora Primary Care Lovell Nuttall: Greig Right Other Clinician: Referring Jevaun Strick: Greig Right Treating Obert Espindola/Extender: Frann Rider in Treatment: 9 Wound Status Wound Number: 1 Primary Etiology: Dehisced Wound Wound Location: Left Malleolus - Medial Wound Status: Open Wounding Event: Gradually Appeared Comorbid History: Sleep Apnea, Hypertension Date Acquired: 04/25/2016 Weeks Of Treatment: 9 Clustered Wound: No Photos Photo Uploaded By: Montey Hora on 07/12/2016 15:48:13 Wound Measurements Length: (cm) 0.7 Width: (cm) 0.4 Depth: (cm) 0.5 Area: (cm) 0.22 Volume: (cm) 0.11 % Reduction in Area: 53.3% % Reduction in Volume: 80.5% Epithelialization: None Tunneling: No Undermining: No Wound  Description Full Thickness Without Exposed Classification: Support Structures Wound Margin: Epibole Exudate Large Amount: Exudate Type: Serous Exudate Color: amber Foul Odor After Cleansing: No Slough/Fibrino Yes Wound Bed Granulation Amount: Large (67-100%) Exposed Structure Granulation Quality: Pink Fascia Exposed: No Necrotic Amount: Small (1-33%) Fat Layer (Subcutaneous Tissue) Exposed: No Fayad, Nicole M. ([id number]) Necrotic Quality: Adherent Slough Tendon Exposed: No Muscle Exposed: No Joint Exposed: No Bone Exposed: No  Periwound Skin Texture Texture Color No Abnormalities Noted: No No Abnormalities Noted: No Callus: No Atrophie Blanche: No Crepitus: No Cyanosis: No Excoriation: No Ecchymosis: Yes Induration: No Erythema: No Rash: No Hemosiderin Staining: No Scarring: No Mottled: No Pallor: No Moisture Rubor: No No Abnormalities Noted: No Dry / Scaly: No Temperature / Pain Maceration: No Temperature: No Abnormality Wound Preparation Ulcer Cleansing: Rinsed/Irrigated with Saline Topical Anesthetic Applied: Other: lidocaine 4%, Treatment Notes Wound #1 (Left, Medial Malleolus) 1. Cleansed with: Clean wound with Normal Saline 2. Anesthetic Topical Lidocaine 4% cream to wound bed prior to debridement 4. Dressing Applied: Santyl Ointment Plain packing gauze 5. Secondary Elias-Fela Solis Signature(s) Signed: 07/12/2016 4:28:06 PM By: Montey Hora Entered By: Montey Hora on 07/12/2016 13:54:18 Jeffery Simon ([id number]) -------------------------------------------------------------------------------- Wound Assessment Details Patient Name: Jeffery Simon. Date of Service: 07/12/2016 1:30 PM Medical Record Number: [id number] Patient Account Number: [id number] Date of Birth/Sex: 1965/12/21 (51 y.o. Male) Treating RN: Montey Hora Primary Care Adeliz Tonkinson: Greig Right Other Clinician: Referring Emil Weigold: Greig Right Treating Nejla Reasor/Extender: Frann Rider in Treatment: 9 Wound Status Wound Number: 2 Primary Etiology: Skin Tear Wound Location: Left Ankle - Anterior Wound Status: Open Wounding Event: Trauma Comorbid History: Sleep Apnea, Hypertension Date Acquired: 07/01/2016 Weeks Of Treatment: 1 Clustered Wound: No Photos Photo Uploaded By: Montey Hora on 07/12/2016 15:48:46 Wound Measurements Length: (cm) 1 Width: (cm) 1 Depth: (cm) 0.1 Area: (cm) 0.785 Volume: (cm) 0.079 % Reduction in Area: 40.5% % Reduction in Volume: 40.2% Epithelialization: None Tunneling: No Undermining: No Wound Description Classification: Partial Thickness Wound Margin: Flat and Intact Exudate Amount: Large Exudate Type: Sanguinous Exudate Color: red Foul Odor After Cleansing: No Slough/Fibrino Yes Wound Bed Granulation Amount: Medium (34-66%) Exposed Structure Granulation Quality: Red Fascia Exposed: No Necrotic Amount: Medium (34-66%) Fat Layer (Subcutaneous Tissue) Exposed: No Necrotic Quality: Adherent Slough Tendon Exposed: No PER, BEAGLEY. ([id number]) Muscle Exposed: No Joint Exposed: No Bone Exposed: No Periwound Skin Texture Texture Color No Abnormalities Noted: No No Abnormalities Noted: No Callus: No Atrophie Blanche: No Crepitus: No Cyanosis: No Excoriation: No Ecchymosis: No Induration: No Erythema: No Rash: No Hemosiderin Staining: No Scarring: No Mottled: No Pallor: No Moisture Rubor: No No Abnormalities Noted: No Dry / Scaly: No Temperature / Pain Maceration: No Temperature: No Abnormality Tenderness on Palpation: Yes Wound Preparation Ulcer Cleansing: Rinsed/Irrigated with Saline Topical Anesthetic Applied: Other: lidocaine 4%, Treatment Notes Wound #2 (Left, Anterior Ankle) 1. Cleansed with: Clean wound with Normal Saline 2. Anesthetic Topical Lidocaine 4% cream to wound bed prior to debridement 4. Dressing Applied: Aquacel Ag 5.  Secondary [place] Signature(s) Signed: 07/12/2016 4:28:06 PM By: Montey Hora Entered By: Montey Hora on 07/12/2016 13:46:03 Jeffery Simon ([id number]) -------------------------------------------------------------------------------- [place] Details Patient Name: Jeffery Simon. Date of Service: 07/12/2016 1:30 PM Medical Record Number: [id number] Patient Account Number: [id number] Date of Birth/Sex: 1965/09/22 (51 y.o. Male) Treating RN: Montey Hora Primary Care Kamren Heintzelman: Greig Right Other Clinician: Referring Kadir Azucena: Greig Right Treating Annette Bertelson/Extender: Frann Rider in Treatment: 9 Vital Signs Time Taken: 13:37 Temperature (F): 98.4 Height (in): 71 Pulse (bpm): 79 Weight (lbs): 389 Respiratory Rate (breaths/min): 18 Body Mass Index (BMI): 54.2 Blood Pressure (mmHg): 120/61 Reference Range: 80 - 120 mg / dl Electronic Signature(s) Signed: 07/12/2016 4:28:06 PM By: Montey Hora Entered By: Montey Hora on 07/12/2016 13:39:32

## 2016-07-13 NOTE — Progress Notes (Signed)
Jeffery, Simon ([id number]) Visit Report for 07/12/2016 Chief Complaint Document Details Patient Name: Jeffery Simon, Jeffery Simon. Date of Service: 07/12/2016 1:30 PM Medical Record Number: [id number] Patient Account Number: [id number] Date of Birth/Sex: 1965-05-04 (51 y.o. Male) Treating RN: Montey Hora Primary Care Provider: Greig Right Other Clinician: Referring Provider: Greig Right Treating Provider/Extender: Frann Rider in Treatment: 9 Information Obtained from: Patient Chief Complaint 05/04/16; Patient is here for review of a post surgical wound on the left medial ankle Electronic Signature(s) Signed: 07/12/2016 2:08:04 PM By: Christin Fudge MD, FACS Entered By: Christin Fudge on 07/12/2016 14:08:03 Jeffery Simon ([id number]) -------------------------------------------------------------------------------- HPI Details Patient Name: Jeffery Simon. Date of Service: 07/12/2016 1:30 PM Medical Record Number: [id number] Patient Account Number: [id number] Date of Birth/Sex: [birth year] (51 y.o. Male) Treating RN: Montey Hora Primary Care Provider: Greig Right Other Clinician: Referring Provider: Greig Right Treating Provider/Extender: Frann Rider in Treatment: 9 History of Present Illness HPI Description: 05/04/16; this is a 51 year old nondiabetic non-smoking man who is here for review of a wound on the left medial ankle. He has severe hypertension, severe obstructive sleep apnea on CPAP and morbid obesity. Is a problem apparently began in December with small veins that ruptured causing copious amounts of bleeding. He followed with Dr. [name] of [facility] underwent laser ablation in the left leg in January and on the right leg later on. He tolerated both of these procedures well. However he was admitted from 04/24/16 through 04/27/16 with cellulitis involving the left ankle. CT scan showed circumferential severe edema with posterior medial ankle fluid collection.  He was given vancomycin and Rocephin and was seen by Dr. [name] and underwent an IandD of the area. Aspiration of the left ankle was negative. Culture showed MRSA and he was discharged on doxycycline which she is still taking and will remain on total next week. He was discharged with iodoform packing to the surgical wound and a Ace wrap that is being applied by his wife. He was treated with vancomycin in the hospital. The patient does not have a prior history of lower extremity wounds although he has chronic venous insufficiency with inflammation and secondary lymphedema. Echocardiogram done in the hospital showed an EF of 50-55%. Grade 2 diastolic dysfunction ABIs in this clinic were 1.03 on the right 0.9 on the left 05/11/16; patient arrives back in clinic today for review of a surgical area on the left medial ankle. He has completed his antibiotics there is no evidence of surrounding infection and no pain. We started silver alginate last week in place of iodoform packing. 05/18/16; the patient's wound is smaller in terms of the orifice and now as 1.3 cm depth versus 1.7 cm last week. He has completed his antibiotics. His insurance did not approve a Snap Vac. The wound is now too small for a standard [employer] electrical Vac. 05/25/16 continues to improve in term of depth. 0.7x0.3x0.8. Ddp down by 0.5cm. original surgical wound 06/01/16; measuring slightly smaller. 0.7x0.3x0.6. still using collagen 06/08/16; filling in slowly. 0.7x0.3x0.4v still using collagen. Travelling to [place] next week 06/25/2016 -- the patient comes today as he has been traveling a lot and was unable to keep his previous appointments with Dr. Dellia Nims. He has not been wearing his compression stockings as advised and his dressing is not changed regularly as he has had a 4-layer compression applied. 07/05/2016 -- he has been traveling a lot but has been able to do his dual layer compression and he is using Santyl. He has developed  a  new superficial ulceration on the anterior part of his left ankle. Electronic Signature(s) Signed: 07/12/2016 2:08:16 PM By: Christin Fudge MD, FACS Entered By: Christin Fudge on 07/12/2016 14:08:15 Jeffery Simon ([id number] Simon ([id number]) -------------------------------------------------------------------------------- Physical Exam Details Patient Name: Jeffery Simon. Date of Service: 07/12/2016 1:30 PM Medical Record Number: [id number] Patient Account Number: [id number] Date of Birth/Sex: Nov 25, 1965 (51 y.o. Male) Treating RN: Montey Hora Primary Care Provider: Greig Right Other Clinician: Referring Provider: Greig Right Treating Provider/Extender: Frann Rider in Treatment: 9 Constitutional . Pulse regular. Respirations normal and unlabored. Afebrile. . Eyes Nonicteric. Reactive to light. Ears, Nose, Mouth, and Throat Lips, teeth, and gums WNL.Marland Kitchen Moist mucosa without lesions. Neck supple and nontender. No palpable supraclavicular or cervical adenopathy. Normal sized without goiter. Respiratory WNL. No retractions.. Cardiovascular Pedal Pulses WNL. No clubbing, cyanosis or edema. Lymphatic No adneopathy. No adenopathy. No adenopathy. Musculoskeletal Adexa without tenderness or enlargement.. Digits and nails w/o clubbing, cyanosis, infection, petechiae, ischemia, or inflammatory conditions.. Integumentary (Hair, Skin) No suspicious lesions. No crepitus or fluctuance. No peri-wound warmth or erythema. No masses.Marland Kitchen Psychiatric Judgement and insight Intact.. No evidence of depression, anxiety, or agitation.. Notes the deeper wound was irrigated with saline and a Q-tip and gauze was used to clean out the depth. We will continue to use Santyl ointment with a packing strip here. The new superficial ulceration on the left anterior ankle has improved enough to stop using Santyl there and we will use some silver alginate over this. Electronic  Signature(s) Signed: 07/12/2016 2:09:10 PM By: Christin Fudge MD, FACS Entered By: Christin Fudge on 07/12/2016 14:09:09 Jeffery Simon ([id number]) -------------------------------------------------------------------------------- Physician Orders Details Patient Name: Jeffery Simon. Date of Service: 07/12/2016 1:30 PM Medical Record Number: [id number] Patient Account Number: [id number] Date of Birth/Sex: 03-05-1965 (51 y.o. Male) Treating RN: Montey Hora Primary Care Provider: Greig Right Other Clinician: Referring Provider: Greig Right Treating Provider/Extender: Frann Rider in Treatment: 9 Verbal / Phone Orders: No Diagnosis Coding Wound Cleansing Wound #1 Left,Medial Malleolus o Cleanse wound with mild soap and water o May Shower, gently pat wound dry prior to applying new dressing. Wound #2 Left,Anterior Ankle o Cleanse wound with mild soap and water o May Shower, gently pat wound dry prior to applying new dressing. Anesthetic Wound #1 Left,Medial Malleolus o Topical Lidocaine 4% cream applied to wound bed prior to debridement Wound #2 Left,Anterior Ankle o Topical Lidocaine 4% cream applied to wound bed prior to debridement Primary Wound Dressing Wound #1 Left,Medial Malleolus o Santyl Ointment o Plain packing gauze Wound #2 Left,Anterior Ankle o Santyl Ointment Secondary Dressing Wound #1 Left,Medial Malleolus o Dry Gauze o Boardered Foam Dressing Wound #2 Left,Anterior Ankle o Dry Gauze o Boardered Foam Dressing Dressing Change Frequency Wound #1 Left,Medial Malleolus o Change dressing every day. TYON, CERASOLI. ([id number]) Wound #2 Left,Anterior Ankle o Change dressing every day. Follow-up Appointments Wound #1 Left,Medial Malleolus o Return Appointment in 1 week. Wound #2 Left,Anterior Ankle o Return Appointment in 1 week. Edema Control Wound #1 Left,Medial Malleolus o Patient to wear own  compression stockings - Please get 30-34mmHg compression socks from [place] and wear them daily from morning until bedtime o Elevate legs to the level of the heart and pump ankles as often as possible Wound #2 Left,Anterior Ankle o Patient to wear own compression stockings - Please get 30-88mmHg compression socks from [place] and wear them daily from morning until bedtime o Elevate legs to the level of the heart  and pump ankles as often as possible Additional Orders / Instructions Wound #1 Left,Medial Malleolus o Increase protein intake. o Activity as tolerated Wound #2 Left,Anterior Ankle o Increase protein intake. o Activity as tolerated Medications-please add to medication list. Wound #1 Left,Medial Malleolus o Santyl Enzymatic Ointment Wound #2 Left,Anterior Ankle o Santyl Enzymatic Ointment Electronic Signature(s) Signed: 07/12/2016 3:10:48 PM By: Christin Fudge MD, FACS Signed: 07/12/2016 4:28:06 PM By: Montey Hora Entered By: Montey Hora on 07/12/2016 13:56:20 Jeffery Simon ([id number]) -------------------------------------------------------------------------------- Problem List Details Patient Name: DAMARRION, MIMBS. Date of Service: 07/12/2016 1:30 PM Medical Record Number: [id number] Patient Account Number: [id number] Date of Birth/Sex: Dec 14, 1965 (51 y.o. 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 Date of Service: 07/12/2016 1:30 PM Medical Record Number: [id number] Patient Account Number: [id number] Date of Birth/Sex: 1965/09/08 (51 y.o. Male) Treating RN: Montey Hora Primary Care Provider: Greig Right Other Clinician: Referring Provider: Greig Right Treating Provider/Extender: Frann Rider in Treatment: 9 Subjective Chief Complaint Information obtained from Patient 05/04/16; Patient is here for review of a post surgical wound on the left medial ankle History of Present Illness (HPI) 05/04/16; this is a 51 year old nondiabetic non-smoking man who is here for review of a wound on the left medial ankle. He has severe hypertension, severe obstructive sleep apnea on CPAP and morbid obesity. Is a problem apparently began in December with small veins that ruptured causing copious amounts of bleeding. He followed with Dr. [name] of [facility] underwent laser ablation in the left leg in January and on the right leg later on. He tolerated both of these procedures well. However he was admitted from 04/24/16 through 04/27/16 with cellulitis involving the left ankle. CT scan showed circumferential severe edema with posterior medial ankle fluid collection. He was given vancomycin and Rocephin and was seen by Dr. [name] and underwent an IandD of the area. Aspiration of the left ankle was negative. Culture showed MRSA and he was discharged on doxycycline which she is still taking and will remain on total next week. He was discharged with iodoform packing to the surgical wound and a Ace wrap that is being applied by his wife. He was treated with  vancomycin in the hospital. The patient does not have a prior history of lower extremity wounds although he has chronic venous insufficiency with inflammation and secondary lymphedema. Echocardiogram done in the hospital showed an EF of 50-55%. Grade 2 diastolic dysfunction ABIs in this clinic were 1.03 on the right 0.9 on the left 05/11/16; patient arrives back in clinic today for review of a surgical area on the left medial ankle. He has completed his antibiotics there is no evidence of surrounding infection and no pain. We started silver alginate last week in place of iodoform packing. 05/18/16; the patient's wound is smaller in terms of the orifice and now as 1.3 cm depth versus 1.7 cm last week. He has completed  his antibiotics. His insurance did not approve a Snap Vac. The wound is now too small for a standard [employer] electrical Vac. 05/25/16 continues to improve in term of depth. 0.7x0.3x0.8. Ddp down by 0.5cm. original surgical wound 06/01/16; measuring slightly smaller. 0.7x0.3x0.6. still using collagen 06/08/16; filling in slowly. 0.7x0.3x0.4v still using collagen. Travelling to [place] next week 06/25/2016 -- the patient comes today as he has been traveling a lot and was unable to keep his previous appointments with Dr. Dellia Nims. He has not been wearing his compression stockings as advised and his dressing is not changed regularly as he has had a 4-layer compression applied. 07/05/2016 -- he has been traveling a lot but has been able to do his dual layer compression and he is using Santyl. He has developed a new superficial ulceration on the anterior part of his left ankle. SAMMUEL, BLICK. ([id number]) Objective Constitutional Pulse regular. Respirations normal and unlabored. Afebrile. Vitals Time Taken: 1:37 PM, Height: 71 in, Weight: 389 lbs, BMI: 54.2, Temperature: 98.4 F, Pulse: 79 bpm, Respiratory Rate: 18 breaths/min, Blood Pressure: 120/61 mmHg. Eyes Nonicteric. Reactive to  light. Ears, Nose, Mouth, and Throat Lips, teeth, and gums WNL.Marland Kitchen Moist mucosa without lesions. Neck supple and nontender. No palpable supraclavicular or cervical adenopathy. Normal sized without goiter. Respiratory WNL. No retractions.. Cardiovascular Pedal Pulses WNL. No clubbing, cyanosis or edema. Lymphatic No adneopathy. No adenopathy. No adenopathy. Musculoskeletal Adexa without tenderness or enlargement.. Digits and nails w/o clubbing, cyanosis, infection, petechiae, ischemia, or inflammatory conditions.Marland Kitchen Psychiatric Judgement and insight Intact.. No evidence of depression, anxiety, or agitation.. General Notes: the deeper wound was irrigated with saline and a Q-tip and gauze was used to clean out the depth. We will continue to use Santyl ointment with a packing strip here. The new superficial ulceration on the left anterior ankle has improved enough to stop using Santyl there and we will use some silver alginate over this. Integumentary (Hair, Skin) No suspicious lesions. No crepitus or fluctuance. No peri-wound warmth or erythema. No masses.. Wound #1 status is Open. Original cause of wound was Gradually Appeared. The wound is located on the [place]. ([id number]) Left,Medial Malleolus. The wound measures 0.7cm length x 0.4cm width x 0.5cm depth; 0.22cm^2 area and 0.11cm^3 volume. There is no tunneling or undermining noted. There is a large amount of serous drainage noted. The wound margin is epibole. There is large (67-100%) pink granulation within the wound bed. There is a small (1-33%) amount of necrotic tissue within the wound bed including Adherent Slough. The periwound skin appearance exhibited: Ecchymosis. The periwound skin appearance did not exhibit: Callus, Crepitus, Excoriation, Induration, Rash, Scarring, Dry/Scaly, Maceration, Atrophie Blanche, Cyanosis, Hemosiderin Staining, Mottled, Pallor, Rubor, Erythema. Periwound temperature was noted as  No Abnormality. Wound #2 status is Open. Original cause of wound was Trauma. The wound is located on the Left,Anterior Ankle. The wound measures 1cm length x 1cm width x 0.1cm depth; 0.785cm^2 area and 0.079cm^3 volume. There is no tunneling or undermining noted. There is a large amount of sanguinous drainage noted. The wound margin is flat and intact. There is medium (34-66%) red granulation within the wound bed. There is a medium (34-66%) amount of necrotic tissue within the wound bed including Adherent Slough. The periwound skin appearance did not exhibit: Callus, Crepitus, Excoriation, Induration, Rash, Scarring, Dry/Scaly, Maceration, Atrophie Blanche, Cyanosis, Ecchymosis, Hemosiderin Staining, Mottled, Pallor, Rubor, Erythema. Periwound temperature was noted as No Abnormality. The periwound has tenderness on palpation. 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 Wound #2 Left,Anterior Ankle: Cleanse wound with mild soap and water May Shower, gently pat wound dry prior to applying new dressing. Anesthetic: Wound #1 Left,Medial Malleolus: Topical Lidocaine 4% cream applied to wound bed prior to debridement Wound #2 Left,Anterior Ankle: DEMITRIUS, CRASS ([id number]) Topical Lidocaine 4% cream applied to wound bed prior to debridement Primary Wound Dressing: Wound #1 Left,Medial Malleolus: Santyl Ointment Plain packing gauze Wound #2 Left,Anterior Ankle: Santyl Ointment Secondary Dressing: Wound #1 Left,Medial Malleolus: Dry Gauze Boardered Foam  Dressing Wound #2 Left,Anterior Ankle: Dry Gauze Boardered Foam Dressing Dressing Change Frequency: Wound #1 Left,Medial Malleolus: Change dressing every day. Wound #2 Left,Anterior Ankle: Change dressing every day. Follow-up Appointments: Wound #1 Left,Medial Malleolus: Return Appointment in 1 week. Wound #2 Left,Anterior Ankle: Return Appointment in 1 week. Edema Control: Wound #1 Left,Medial Malleolus: Patient to wear own compression stockings - Please get 30-52mmHg compression socks from [place] and wear them daily from morning until bedtime Elevate legs to the level of the heart and pump ankles as often as possible Wound #2 Left,Anterior Ankle: Patient to wear own compression stockings - Please get 30-35mmHg compression socks from [place] and wear them daily from morning until bedtime Elevate legs to the level of the heart and pump ankles as often as possible Additional Orders / Instructions: Wound #1 Left,Medial Malleolus: Increase protein intake. Activity as tolerated Wound #2 Left,Anterior Ankle: Increase protein intake. Activity as tolerated Medications-please add to medication list.: Wound #1 Left,Medial Malleolus: Santyl Enzymatic Ointment Wound #2 Left,Anterior Ankle: Santyl Enzymatic Ointment SELDEN, NOTEBOOM ([id number]) The patient continues to travel a lot and is back to see me today for follow-up. 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 We'll continue with Santyl ointment locally to the deep wound and have a packing strip of plain gauze applied to this. Silver alginate will be used on the superficial wound. Electronic Signature(s) Signed: 07/12/2016 2:11:46 PM By: Christin Fudge MD, FACS Entered By: Christin Fudge on 07/12/2016 14:11:46 Jeffery Simon  ([id number]) -------------------------------------------------------------------------------- SuperBill Details Patient Name: Jeffery Simon. Date of Service: 07/12/2016 Medical Record Number: [id number] Patient Account Number: [id number] Date of Birth/Sex: 04/04/1965 (51 y.o. 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 Facility Procedures CPT4 Code: [id number] Description: 99213 - WOUND CARE VISIT-LEV 3 EST PT Modifier: Quantity: 1 Physician Procedures CPT4: Description Modifier Quantity Code [id number] 99213 - WC PHYS LEVEL 3 - EST PT 1 ICD-10 Description Diagnosis T81.31XD Disruption of external operation (surgical) wound, not elsewhere classified, subsequent encounter I87.332 Chronic venous  hypertension (idiopathic) with ulcer and inflammation of left lower extremity 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

## 2016-07-30 ENCOUNTER — Encounter: Payer: BLUE CROSS/BLUE SHIELD | Attending: Physician Assistant | Admitting: Physician Assistant

## 2016-07-30 DIAGNOSIS — Z6841 Body Mass Index (BMI) 40.0 and over, adult: Secondary | ICD-10-CM | POA: Diagnosis not present

## 2016-07-30 DIAGNOSIS — I87332 Chronic venous hypertension (idiopathic) with ulcer and inflammation of left lower extremity: Secondary | ICD-10-CM | POA: Insufficient documentation

## 2016-07-30 DIAGNOSIS — L97323 Non-pressure chronic ulcer of left ankle with necrosis of muscle: Secondary | ICD-10-CM | POA: Diagnosis not present

## 2016-07-30 DIAGNOSIS — T8131XD Disruption of external operation (surgical) wound, not elsewhere classified, subsequent encounter: Secondary | ICD-10-CM | POA: Diagnosis not present

## 2016-07-30 DIAGNOSIS — T8131XA Disruption of external operation (surgical) wound, not elsewhere classified, initial encounter: Secondary | ICD-10-CM | POA: Diagnosis not present

## 2016-07-30 DIAGNOSIS — G4733 Obstructive sleep apnea (adult) (pediatric): Secondary | ICD-10-CM | POA: Insufficient documentation

## 2016-07-30 DIAGNOSIS — I10 Essential (primary) hypertension: Secondary | ICD-10-CM | POA: Insufficient documentation

## 2016-07-30 DIAGNOSIS — Y839 Surgical procedure, unspecified as the cause of abnormal reaction of the patient, or of later complication, without mention of misadventure at the time of the procedure: Secondary | ICD-10-CM | POA: Insufficient documentation

## 2016-07-31 NOTE — Progress Notes (Signed)
TODRICK, SIEDSCHLAG ([id number]) Visit Report for 07/30/2016 Arrival Information Details Patient Name: Jeffery Simon, Jeffery Simon. Date of Service: 07/30/2016 11:00 AM Medical Record Number: [id number] Patient Account Number: [id number] Date of Birth/Sex: 08-05-1965 (51 y.o. Male) Treating RN: Montey Hora Primary Care Brendon Christoffel: Greig Right Other Clinician: Referring Akshaya Toepfer: Greig Right Treating Natalie Leclaire/Extender: Melburn Hake, HOYT Weeks in Treatment: 12 Visit Information History Since Last Visit Added or deleted any medications: No Patient Arrived: Ambulatory Any new allergies or adverse reactions: No Arrival Time: 10:59 Had a fall or experienced change in No Accompanied By: neice activities of daily living that may affect Transfer Assistance: None risk of falls: Patient Identification Verified: Yes Signs or symptoms of abuse/neglect since last No Secondary Verification Process Yes visito Completed: Hospitalized since last visit: No Patient Requires Transmission-Based No Has Dressing in Place as Prescribed: Yes Precautions: Has Compression in Place as Prescribed: Yes Patient Has Alerts: No Pain Present Now: No Electronic Signature(s) Signed: 07/30/2016 4:43:55 PM By: Montey Hora Entered By: Montey Hora on 07/30/2016 11:00:38 Jeffery Simon ([id number]) -------------------------------------------------------------------------------- Encounter Discharge Information Details Patient Name: Jeffery Simon. Date of Service: 07/30/2016 11:00 AM Medical Record Number: [id number] Patient Account Number: [id number] Date of Birth/Sex: January 22, 1966 (51 y.o. Male) Treating RN: Montey Hora Primary Care Ibn Stief: Greig Right Other Clinician: Referring Dunya Meiners: Greig Right Treating Marifer Hurd/Extender: Melburn Hake, HOYT Weeks in Treatment: 12 Encounter Discharge Information Items Discharge Pain Level: 0 Discharge Condition: Stable Ambulatory Status: Ambulatory Discharge  Destination: Home Private Transportation: Auto Accompanied By: neice Schedule Follow-up Appointment: Yes Medication Reconciliation completed and No provided to Patient/Care Vennessa Affinito: Clinical Summary of Care: Electronic Signature(s) Signed: 07/30/2016 4:43:55 PM By: Montey Hora Entered By: Montey Hora on 07/30/2016 11:29:05 Jeffery Simon ([id number]) -------------------------------------------------------------------------------- Lower Extremity Assessment Details Patient Name: Jeffery Simon. Date of Service: 07/30/2016 11:00 AM Medical Record Number: [id number] Patient Account Number: [id number] Date of Birth/Sex: 1965/11/20 (51 y.o. Male) Treating RN: Montey Hora Primary Care Zethan Alfieri: Greig Right Other Clinician: Referring Darnell Jeschke: Greig Right Treating Murle Otting/Extender: Melburn Hake, HOYT Weeks in Treatment: 12 Edema Assessment Assessed: [Left: No] [Right: No] E[Left: dema] [Right: :] Calf Left: Right: Point of Measurement: 38 cm From Medial Instep cm cm Ankle Left: Right: Point of Measurement: 12 cm From Medial Instep cm cm Vascular Assessment Pulses: Dorsalis Pedis Palpable: [Left:Yes] Posterior Tibial Extremity colors, hair growth, and conditions: Extremity Color: [Left:Hyperpigmented] Hair Growth on Extremity: [Left:No] Temperature of Extremity: [Left:Warm] Capillary Refill: [Left:< 3 seconds] Electronic Signature(s) Signed: 07/30/2016 4:43:55 PM By: Montey Hora Entered By: Montey Hora on 07/30/2016 11:09:54 Jeffery Simon ([id number]) -------------------------------------------------------------------------------- Multi Wound Chart Details Patient Name: Jeffery Simon. Date of Service: 07/30/2016 11:00 AM Medical Record Number: [id number] Patient Account Number: [id number] Date of Birth/Sex: 1965/02/17 (51 y.o. Male) Treating RN: Montey Hora Primary Care Reisha Wos: Greig Right Other Clinician: Referring Gladyce Mcray: Greig Right Treating Valarie Farace/Extender: Melburn Hake, HOYT Weeks in Treatment: 12 Vital Signs Height(in): 71 Pulse(bpm): 84 Weight(lbs): 389 Blood Pressure 129/67 (mmHg): Body Mass Index(BMI): 54 Temperature(F): 97.8 Respiratory Rate 18 (breaths/min): Photos: [1:No Photos] [2:No Photos] [N/A:N/A] Wound Location: [1:Left Malleolus - Medial] [2:Left, Anterior Ankle] [N/A:N/A] Wounding Event: [1:Gradually Appeared] [2:Trauma] [N/A:N/A] Primary Etiology: [1:Dehisced Wound] [2:Skin Tear] [N/A:N/A] Comorbid History: [1:Sleep Apnea, Hypertension] [2:N/A] [N/A:N/A] Date Acquired: [1:04/25/2016] [2:07/01/2016] [N/A:N/A] Weeks of Treatment: [1:12] [2:3] [N/A:N/A] Wound Status: [1:Open] [2:Healed - Epithelialized] [N/A:N/A] Measurements L x W x D 0.7x0.4x0.2 [2:0x0x0] [N/A:N/A] (cm) Area (cm) : [1:0.22] [2:0] [N/A:N/A] Volume (cm) : [1:0.044] [2:0] [N/A:N/A] % Reduction in Area: [1:53.30%] [2:100.00%] [  N/A:N/A] % Reduction in Volume: 92.20% [2:100.00%] [N/A:N/A] Classification: [1:Full Thickness Without Exposed Support Structures] [2:Partial Thickness] [N/A:N/A] Exudate Amount: [1:Large] [2:N/A] [N/A:N/A] Exudate Type: [1:Serous] [2:N/A] [N/A:N/A] Exudate Color: [1:amber] [2:N/A] [N/A:N/A] Wound Margin: [1:Epibole] [2:N/A] [N/A:N/A] Granulation Amount: [1:Large (67-100%)] [2:N/A] [N/A:N/A] Granulation Quality: [1:Pink] [2:N/A] [N/A:N/A] Necrotic Amount: [1:Small (1-33%)] [2:N/A] [N/A:N/A] Exposed Structures: [1:Fascia: No Fat Layer (Subcutaneous Tissue) Exposed: No Tendon: No Muscle: No] [2:N/A] [N/A:N/A] Joint: No Bone: No Epithelialization: None N/A N/A Periwound Skin Texture: Excoriation: No No Abnormalities Noted N/A Induration: No Callus: No Crepitus: No Rash: No Scarring: No Periwound Skin Maceration: No No Abnormalities Noted N/A Moisture: Dry/Scaly: No Periwound Skin Color: Ecchymosis: Yes No Abnormalities Noted N/A Atrophie Blanche: No Cyanosis: No Erythema:  No Hemosiderin Staining: No Mottled: No Pallor: No Rubor: No Temperature: No Abnormality N/A N/A Tenderness on No No N/A Palpation: Wound Preparation: Ulcer Cleansing: N/A N/A Rinsed/Irrigated with Saline Topical Anesthetic Applied: Other: lidocaine 4% Treatment Notes Electronic Signature(s) Signed: 07/30/2016 4:43:55 PM By: Montey Hora Entered By: Montey Hora on 07/30/2016 11:10:08 Jeffery Simon ([id number]) -------------------------------------------------------------------------------- [place] Details Patient Name: Jeffery Simon. Date of Service: 07/30/2016 11:00 AM Medical Record Number: [id number] Patient Account Number: [id number] Date of Birth/Sex: 04/07/1965 (51 y.o. Male) Treating RN: Montey Hora Primary Care Jovane Foutz: Greig Right Other Clinician: Referring Rachael Ferrie: Greig Right Treating Fabio Wah/Extender: Melburn Hake, HOYT Weeks in Treatment: 12 Active Inactive ` Orientation to the Wound Care Program Nursing Diagnoses: Knowledge deficit related to the wound healing center program Goals: Patient/caregiver will verbalize understanding of the [facility] Date Initiated: 05/04/2016 Target Resolution Date: 09/03/2016 Goal Status: Active Interventions: Provide education on orientation to the wound center Notes: ` Venous Leg Ulcer Nursing Diagnoses: Actual venous Insuffiency (use after diagnosis is confirmed) Potential for venous Insuffiency (use before diagnosis confirmed) Goals: Non-invasive venous studies are completed as ordered Date Initiated: 05/04/2016 Target Resolution Date: 09/03/2016 Goal Status: Active Patient will maintain optimal edema control Date Initiated: 05/04/2016 Target Resolution Date: 09/03/2016 Goal Status: Active Patient/caregiver will verbalize understanding of disease process and disease management Date Initiated: 05/04/2016 Target Resolution Date: 09/03/2016 Goal Status:  Active Verify adequate tissue perfusion prior to therapeutic compression application Date Initiated: 05/04/2016 Target Resolution Date: 09/03/2016 Goal Status: Active FRANKIE, SCIPIO. ([id number]) Interventions: Assess peripheral edema status every visit. Compression as ordered Provide education on venous insufficiency Treatment Activities: Therapeutic compression applied : 05/04/2016 Notes: ` Wound/Skin Impairment Nursing Diagnoses: Impaired tissue integrity Knowledge deficit related to ulceration/compromised skin integrity Goals: Patient/caregiver will verbalize understanding of skin care regimen Date Initiated: 05/04/2016 Target Resolution Date: 09/03/2016 Goal Status: Active Ulcer/skin breakdown will have a volume reduction of 30% by week 4 Date Initiated: 05/04/2016 Target Resolution Date: 09/03/2016 Goal Status: Active Ulcer/skin breakdown will have a volume reduction of 50% by week 8 Date Initiated: 05/04/2016 Target Resolution Date: 09/03/2016 Goal Status: Active Ulcer/skin breakdown will have a volume reduction of 80% by week 12 Date Initiated: 05/04/2016 Target Resolution Date: 09/03/2016 Goal Status: Active Ulcer/skin breakdown will heal within 14 weeks Date Initiated: 05/04/2016 Target Resolution Date: 09/03/2016 Goal Status: Active Interventions: Assess patient/caregiver ability to obtain necessary supplies Assess patient/caregiver ability to perform ulcer/skin care regimen upon admission and as needed Assess ulceration(s) every visit Provide education on ulcer and skin care Treatment Activities: Referred to DME Ikesha Siller for dressing supplies : 05/04/2016 Skin care regimen initiated : 05/04/2016 Topical wound management initiated : 05/04/2016 EMRICK, HENSCH ([id number]) Notes: Electronic Signature(s) Signed: 07/30/2016 4:43:55 PM By: Montey Hora Entered By:  Montey Hora on 07/30/2016 11:10:00 FINNIS, COLEE  ([id number]) -------------------------------------------------------------------------------- Pain Assessment Details Patient Name: TREYLON, HENARD. Date of Service: 07/30/2016 11:00 AM Medical Record Number: [id number] Patient Account Number: [id number] Date of Birth/Sex: July 23, 1965 (51 y.o. Male) Treating RN: Montey Hora Primary Care Lateria Alderman: Greig Right Other Clinician: Referring Brylyn Novakovich: Greig Right Treating Lexys Milliner/Extender: Melburn Hake, HOYT Weeks in Treatment: 12 Active Problems Location of Pain Severity and Description of Pain Patient Has Paino No Site Locations Pain Management and Medication Current Pain Management: Notes Topical or injectable lidocaine is offered to patient for acute pain when surgical debridement is performed. If needed, Patient is instructed to use over the counter pain medication for the following 24-48 hours after debridement. Wound care MDs do not prescribed pain medications. Patient has chronic pain or uncontrolled pain. Patient has been instructed to make an appointment with their Primary Care Physician for pain management. Electronic Signature(s) Signed: 07/30/2016 4:43:55 PM By: Montey Hora Entered By: Montey Hora on 07/30/2016 11:00:50 Jeffery Simon ([id number]) -------------------------------------------------------------------------------- Patient/Caregiver Education Details Patient Name: Jeffery Simon. Date of Service: 07/30/2016 11:00 AM Medical Record Number: [id number] Patient Account Number: [id number] Date of Birth/Gender: [birth year] (51 y.o. Male) Treating RN: Montey Hora Primary Care Physician: Greig Right Other Clinician: Referring Physician: Greig Right Treating Physician/Extender: Sharalyn Ink in Treatment: 12 Education Assessment Education Provided To: Patient Education Topics Provided Wound/Skin Impairment: Handouts: Other: wund care as ordered Methods: Demonstration,  Explain/Verbal Responses: State content correctly Electronic Signature(s) Signed: 07/30/2016 4:43:55 PM By: Montey Hora Entered By: Montey Hora on 07/30/2016 11:29:36 Jeffery Simon ([id number]) -------------------------------------------------------------------------------- Wound Assessment Details Patient Name: Jeffery Simon. Date of Service: 07/30/2016 11:00 AM Medical Record Number: [id number] Patient Account Number: [id number] Date of Birth/Sex: 06-Jan-1966 (51 y.o. Male) Treating RN: Montey Hora Primary Care Eldora Napp: Greig Right Other Clinician: Referring Vania Rosero: Greig Right Treating Farren Nelles/Extender: Melburn Hake, HOYT Weeks in Treatment: 12 Wound Status Wound Number: 1 Primary Etiology: Dehisced Wound Wound Location: Left Malleolus - Medial Wound Status: Open Wounding Event: Gradually Appeared Comorbid History: Sleep Apnea, Hypertension Date Acquired: 04/25/2016 Weeks Of Treatment: 12 Clustered Wound: No Photos Photo Uploaded By: Montey Hora on 07/30/2016 11:58:15 Wound Measurements Length: (cm) 0.7 Width: (cm) 0.4 Depth: (cm) 0.2 Area: (cm) 0.22 Volume: (cm) 0.044 % Reduction in Area: 53.3% % Reduction in Volume: 92.2% Epithelialization: None Tunneling: No Undermining: No Wound Description Full Thickness Without Exposed Classification: Support Structures Wound Margin: Epibole Exudate Large Amount: Exudate Type: Serous Exudate Color: amber Foul Odor After Cleansing: No Slough/Fibrino Yes Wound Bed Granulation Amount: Large (67-100%) Exposed Structure Granulation Quality: Pink Fascia Exposed: No Necrotic Amount: Small (1-33%) Fat Layer (Subcutaneous Tissue) Exposed: No Schubach, Raymon MMarland Kitchen ([id number]) Necrotic Quality: Adherent Slough Tendon Exposed: No Muscle Exposed: No Joint Exposed: No Bone Exposed: No Periwound Skin Texture Texture Color No Abnormalities Noted: No No Abnormalities Noted: No Callus: No Atrophie  Blanche: No Crepitus: No Cyanosis: No Excoriation: No Ecchymosis: Yes Induration: No Erythema: No Rash: No Hemosiderin Staining: No Scarring: No Mottled: No Pallor: No Moisture Rubor: No No Abnormalities Noted: No Dry / Scaly: No Temperature / Pain Maceration: No Temperature: No Abnormality Wound Preparation Ulcer Cleansing: Rinsed/Irrigated with Saline Topical Anesthetic Applied: Other: lidocaine 4%, Treatment Notes Wound #1 (Left, Medial Malleolus) 1. Cleansed with: Clean wound with Normal Saline 2. Anesthetic Topical Lidocaine 4% cream to wound bed prior to debridement 4. Dressing Applied: Prisma Ag 5. Secondary Dressing Applied Bordered Foam Dressing Dry Gauze Electronic Signature(s) Signed: 07/30/2016 4:43:55 PM By:  Dorthy, Di Kindle Entered By: Montey Hora on 07/30/2016 11:08:09 Jeffery Simon ([id number]) -------------------------------------------------------------------------------- Wound Assessment Details Patient Name: LEIAM, HOPWOOD. Date of Service: 07/30/2016 11:00 AM Medical Record Number: [id number] Patient Account Number: [id number] Date of Birth/Sex: 1965-10-14 (51 y.o. Male) Treating RN: Montey Hora Primary Care Amyr Sluder: Greig Right Other Clinician: Referring Launa Goedken: Greig Right Treating Machelle Raybon/Extender: Melburn Hake, HOYT Weeks in Treatment: 12 Wound Status Wound Number: 2 Primary Etiology: Skin Tear Wound Location: Left, Anterior Ankle Wound Status: Healed - Epithelialized Wounding Event: Trauma Date Acquired: 07/01/2016 Weeks Of Treatment: 3 Clustered Wound: No Photos Photo Uploaded By: Montey Hora on 07/30/2016 11:58:16 Wound Measurements Length: (cm) 0 % Reduction Width: (cm) 0 % Reduction Depth: (cm) 0 Area: (cm) 0 Volume: (cm) 0 in Area: 100% in Volume: 100% Wound Description Classification: Partial Thickness Periwound Skin Texture Texture Color No Abnormalities Noted: No No Abnormalities Noted:  No Moisture No Abnormalities Noted: No Electronic Signature(s) Signed: 07/30/2016 4:43:55 PM By: Carter Kitten ([id number]) Entered By: Montey Hora on 07/30/2016 11:07:20 Jeffery Simon ([id number]) -------------------------------------------------------------------------------- [place] Details Patient Name: Jeffery Simon. Date of Service: 07/30/2016 11:00 AM Medical Record Number: [id number] Patient Account Number: [id number] Date of Birth/Sex: 06-05-1965 (51 y.o. Male) Treating RN: Montey Hora Primary Care Adalind Weitz: Greig Right Other Clinician: Referring Marcanthony Sleight: Greig Right Treating Zyonna Vardaman/Extender: Melburn Hake, HOYT Weeks in Treatment: 12 Vital Signs Time Taken: 11:00 Temperature (F): 97.8 Height (in): 71 Pulse (bpm): 84 Weight (lbs): 389 Respiratory Rate (breaths/min): 18 Body Mass Index (BMI): 54.2 Blood Pressure (mmHg): 129/67 Reference Range: 80 - 120 mg / dl Electronic Signature(s) Signed: 07/30/2016 4:43:55 PM By: Montey Hora Entered By: Montey Hora on 07/30/2016 11:03:10

## 2016-08-02 NOTE — Progress Notes (Signed)
Jeffery Simon, Jeffery Simon ([id number]) Visit Report for 07/30/2016 Chief Complaint Document Details Patient Name: Jeffery Simon, Jeffery Simon. Date of Service: 07/30/2016 11:00 AM Medical Record Number: [id number] Patient Account Number: [id number] Date of Birth/Sex: 12-19-1965 (51 y.o. Male) Treating RN: Montey Hora Primary Care Provider: Greig Right Other Clinician: Referring Provider: Greig Right Treating Provider/Extender: Melburn Hake, HOYT Weeks in Treatment: 12 Information Obtained from: Patient Chief Complaint Patient is here for review of a post surgical wound on the left medial ankle Electronic Signature(s) Signed: 07/30/2016 4:47:40 PM By: Worthy Keeler PA-C Entered By: Worthy Keeler on 07/30/2016 11:27:23 Jeffery Simon ([id number]) -------------------------------------------------------------------------------- Debridement Details Patient Name: Jeffery Simon. Date of Service: 07/30/2016 11:00 AM Medical Record Number: [id number] Patient Account Number: [id number] Date of Birth/Sex: August 09, 1965 (51 y.o. Male) Treating RN: Montey Hora Primary Care Provider: Greig Right Other Clinician: Referring Provider: Greig Right Treating Provider/Extender: Melburn Hake, HOYT Weeks in Treatment: 12 Debridement Performed for Wound #1 Left,Medial Malleolus Assessment: Performed By: Physician STONE III, HOYT E., PA-C Debridement: Debridement Pre-procedure Verification/Time Out Yes - 11:30 Taken: Start Time: 11:30 Pain Control: Lidocaine 4% Topical Solution Level: Skin/Subcutaneous Tissue Total Area Debrided (L x 0.7 (cm) x 0.4 (cm) = 0.28 (cm) W): Tissue and other Viable, Non-Viable, Fibrin/Slough, Subcutaneous material debrided: Instrument: Curette Bleeding: Minimum Hemostasis Achieved: Pressure End Time: 11:32 Procedural Pain: 0 Post Procedural Pain: 0 Response to Treatment: Procedure was tolerated well Post Debridement Measurements of Total Wound Length: (cm)  0.7 Width: (cm) 0.4 Depth: (cm) 0.3 Volume: (cm) 0.066 Character of Wound/Ulcer Post Improved Debridement: Post Procedure Diagnosis Same as Pre-procedure Electronic Signature(s) Signed: 07/30/2016 4:43:55 PM By: Montey Hora Signed: 07/30/2016 4:47:40 PM By: Worthy Keeler PA-C Entered By: Montey Hora on 07/30/2016 11:32:25 Jeffery Simon ([id number]) -------------------------------------------------------------------------------- HPI Details Patient Name: Jeffery Simon. Date of Service: 07/30/2016 11:00 AM Medical Record Number: [id number] Patient Account Number: [id number] Date of Birth/Sex: [birth year] (51 y.o. Male) Treating RN: Montey Hora Primary Care Provider: Greig Right Other Clinician: Referring Provider: Greig Right Treating Provider/Extender: Melburn Hake, HOYT Weeks in Treatment: 12 History of Present Illness HPI Description: 05/04/16; this is a 51 year old nondiabetic non-smoking man who is here for review of a wound on the left medial ankle. He has severe hypertension, severe obstructive sleep apnea on CPAP and morbid obesity. Is a problem apparently began in December with small veins that ruptured causing copious amounts of bleeding. He followed with Dr. [name] of [facility] underwent laser ablation in the left leg in January and on the right leg later on. He tolerated both of these procedures well. However he was admitted from 04/24/16 through 04/27/16 with cellulitis involving the left ankle. CT scan showed circumferential severe edema with posterior medial ankle fluid collection. He was given vancomycin and Rocephin and was seen by Dr. [name] and underwent an IandD of the area. Aspiration of the left ankle was negative. Culture showed MRSA and he was discharged on doxycycline which she is still taking and will remain on total next week. He was discharged with iodoform packing to the surgical wound and a Ace wrap that is being applied by his wife. He was  treated with vancomycin in the hospital. The patient does not have a prior history of lower extremity wounds although he has chronic venous insufficiency with inflammation and secondary lymphedema. Echocardiogram done in the hospital showed an EF of 50-55%. Grade 2 diastolic dysfunction ABIs in this clinic were 1.03 on the right 0.9 on the left 05/11/16; patient arrives back  in clinic today for review of a surgical area on the left medial ankle. He has completed his antibiotics there is no evidence of surrounding infection and no pain. We started silver alginate last week in place of iodoform packing. 05/18/16; the patient's wound is smaller in terms of the orifice and now as 1.3 cm depth versus 1.7 cm last week. He has completed his antibiotics. His insurance did not approve a Snap Vac. The wound is now too small for a standard [employer] electrical Vac. 05/25/16 continues to improve in term of depth. 0.7x0.3x0.8. Ddp down by 0.5cm. original surgical wound 06/01/16; measuring slightly smaller. 0.7x0.3x0.6. still using collagen 06/08/16; filling in slowly. 0.7x0.3x0.4v still using collagen. Travelling to [place] next week 06/25/2016 -- the patient comes today as he has been traveling a lot and was unable to keep his previous appointments with Dr. Dellia Nims. He has not been wearing his compression stockings as advised and his dressing is not changed regularly as he has had a 4-layer compression applied. 07/05/2016 -- he has been traveling a lot but has been able to do his dual layer compression and he is using Santyl. He has developed a new superficial ulceration on the anterior part of his left ankle. 07/30/16 on evaluation today patient's wound appears to be doing well. There is no evidence of infection and he has only minimal discomfort. Electronic Signature(s) Jeffery Simon, Jeffery Simon ([id number]) Signed: 07/30/2016 4:55:59 PM By: Worthy Keeler PA-C Entered By: Worthy Keeler on 07/30/2016 16:53:48 Jeffery Simon ([id number]) -------------------------------------------------------------------------------- Physical Exam Details Patient Name: Jeffery Simon. Date of Service: 07/30/2016 11:00 AM Medical Record Number: [id number] Patient Account Number: [id number] Date of Birth/Sex: 05/15/1965 (51 y.o. Male) Treating RN: Montey Hora Primary Care Provider: Greig Right Other Clinician: Referring Provider: Greig Right Treating Provider/Extender: STONE III, HOYT Weeks in Treatment: 12 Constitutional Obese and well-hydrated in no acute distress. Respiratory normal breathing without difficulty. Psychiatric this patient is able to make decisions and demonstrates good insight into disease process. Alert and Oriented x 3. pleasant and cooperative. Notes Patient does have slough covering about 50% of the wound on evaluation today. However with sharp debridement there was a dramatic improvement in the quality of the wound bed overall. Electronic Signature(s) Signed: 07/30/2016 4:55:59 PM By: Worthy Keeler PA-C Entered By: Worthy Keeler on 07/30/2016 16:54:52 Jeffery Simon ([id number]) -------------------------------------------------------------------------------- Physician Orders Details Patient Name: Jeffery Simon. Date of Service: 07/30/2016 11:00 AM Medical Record Number: [id number] Patient Account Number: [id number] Date of Birth/Sex: Jul 01, 1965 (51 y.o. 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 Anesthetic Wound #1 Left,Medial Malleolus o Topical Lidocaine 4% cream applied to wound bed prior to debridement Primary Wound Dressing Wound #1 Left,Medial Malleolus o Prisma Ag Secondary Dressing Wound #1 Left,Medial Malleolus o Dry Gauze o Boardered Foam Dressing Dressing Change Frequency Wound #1 Left,Medial Malleolus o Change dressing every day. Follow-up Appointments Wound #1 Left,Medial Malleolus o Return Appointment in 1 week. Edema Control  Jeffery Simon, Jeffery Simon. ([id number]) Wound #1 Left,Medial Malleolus o Patient to wear own compression stockings - Please get 30-35mmHg compression socks from [place] and wear them daily from morning until bedtime o Elevate legs to the level of the heart and pump ankles as often as possible Additional Orders / Instructions Wound #1 Left,Medial Malleolus o Increase protein intake. o Activity as tolerated Notes We will continue with Prisma and Allevyn for the next week. We will then see him for follow-up visit about time to see were things stand. If anything worsens in the interim he will contact our office for additional recommendations. Electronic Signature(s) Signed: 07/30/2016 4:55:59 PM By: Worthy Keeler PA-C Previous Signature: 07/30/2016 4:43:55 PM Version By: Montey Hora Previous Signature: 07/30/2016 4:47:40 PM Version By: Worthy Keeler PA-C Entered By: Worthy Keeler on 07/30/2016 16:55:24 Jeffery Simon ([id number]) -------------------------------------------------------------------------------- Problem List Details Patient Name: Jeffery Simon. Date of Service: 07/30/2016 11:00 AM Medical Record Number: [id number] Patient Account Number: [id number] Date of Birth/Sex: [birth year] (51 y.o. 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 Date of Service: 07/30/2016 11:00 AM Medical Record Number: [id number] Patient Account Number: [id number] Date of Birth/Sex: 31-Dec-1965 (51 y.o. Male) Treating RN: Montey Hora Primary Care Provider: Greig Right Other Clinician: Referring Provider: Greig Right Treating Provider/Extender: Melburn Hake, HOYT Weeks in Treatment: 12 Subjective Chief Complaint Information obtained from Patient Patient is here for review of a post surgical wound on the left medial ankle History of Present Illness (HPI) 05/04/16; this is a 51 year old nondiabetic non-smoking man who is here for review of a wound on the left medial ankle. He has severe hypertension, severe obstructive sleep apnea on CPAP and morbid obesity. Is a problem apparently began in December with small veins that ruptured causing copious amounts of bleeding. He followed with Dr. [name] of [facility] underwent laser ablation in the left leg in January and on the right leg later on. He tolerated both of these procedures well. However he was admitted from  04/24/16 through 04/27/16 with cellulitis involving the left ankle. CT scan showed circumferential severe edema with posterior medial ankle fluid collection. He was given vancomycin and Rocephin and was seen by Dr. [name] and underwent an IandD of the area. Aspiration of the left ankle was negative. Culture showed MRSA and he was discharged on doxycycline which she is still taking and will remain on total next week. He was discharged with iodoform packing to the surgical wound and a Ace wrap that is being applied by his wife. He was treated with vancomycin in the hospital. The patient does not have a prior history of lower extremity wounds although he has chronic venous insufficiency with inflammation and secondary lymphedema. Echocardiogram done in the hospital showed an EF of 50-55%. Grade 2 diastolic dysfunction ABIs in this clinic were 1.03 on the right 0.9 on the left 05/11/16; patient arrives back in  clinic today for review of a surgical area on the left medial ankle. He has completed his antibiotics there is no evidence of surrounding infection and no pain. We started silver alginate last week in place of iodoform packing. 05/18/16; the patient's wound is smaller in terms of the orifice and now as 1.3 cm depth versus 1.7 cm last week. He has completed his antibiotics. His insurance did not approve a Snap Vac. The wound is now too small for a standard [employer] electrical Vac. 05/25/16 continues to improve in term of depth. 0.7x0.3x0.8. Ddp down by 0.5cm. original surgical wound 06/01/16; measuring slightly smaller. 0.7x0.3x0.6. still using collagen 06/08/16; filling in slowly. 0.7x0.3x0.4v still using collagen. Travelling to [place] next week 06/25/2016 -- the patient comes today as he has been traveling a lot and was unable to keep his previous appointments with Dr. Dellia Nims. He has not been wearing his compression stockings as advised and his dressing is not changed regularly as he has had a 4-layer  compression applied. 07/05/2016 -- he has been traveling a lot but has been able to do his dual layer compression and he is using Santyl. He has developed a new superficial ulceration on the anterior part of his left ankle. Jeffery Simon, Jeffery Simon ([id number]) 07/30/16 on evaluation today patient's wound appears to be doing well. There is no evidence of infection and he has only minimal discomfort. Objective Constitutional Obese and well-hydrated in no acute distress. Vitals Time Taken: 11:00 AM, Height: 71 in, Weight: 389 lbs, BMI: 54.2, Temperature: 97.8 F, Pulse: 84 bpm, Respiratory Rate: 18 breaths/min, Blood Pressure: 129/67 mmHg. Respiratory normal breathing without difficulty. Psychiatric this patient is able to make decisions and demonstrates good insight into disease process. Alert and Oriented x 3. pleasant and cooperative. General Notes: Patient does have slough covering about 50% of the wound on evaluation today. However with sharp debridement there was a dramatic improvement in the quality of the wound bed overall. Integumentary (Hair, Skin) Wound #1 status is Open. Original cause of wound was Gradually Appeared. The wound is located on the Left,Medial Malleolus. The wound measures 0.7cm length x 0.4cm width x 0.2cm depth; 0.22cm^2 area and 0.044cm^3 volume. There is no tunneling or undermining noted. There is a large amount of serous drainage noted. The wound margin is epibole. There is large (67-100%) pink granulation within the wound bed. There is a small (1-33%) amount of necrotic tissue within the wound bed including Adherent Slough. The periwound skin appearance exhibited: Ecchymosis. The periwound skin appearance did not exhibit: Callus, Crepitus, Excoriation, Induration, Rash, Scarring, Dry/Scaly, Maceration, Atrophie Blanche, Cyanosis, Hemosiderin Staining, Mottled, Pallor, Rubor, Erythema. Periwound temperature was noted as No Abnormality. Wound #2 status is Healed -  Epithelialized. Original cause of wound was Trauma. The wound is located on the Left,Anterior Ankle. The wound measures 0cm length x 0cm width x 0cm depth; 0cm^2 area and 0cm^3 volume. Jeffery Simon, Jeffery Simon. 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 There was a Skin/Subcutaneous Tissue Debridement ([id number]) debridement with total area of 0.28 sq cm performed by STONE III, HOYT E., PA-C. with the following instrument(s): Curette to remove Viable and Non-Viable tissue/material including Fibrin/Slough and Subcutaneous after achieving pain  control using Lidocaine 4% Topical Solution. A time out was conducted at 11:30, prior to the start of the procedure. A Minimum amount of bleeding was controlled with Pressure. The procedure was tolerated well with a pain level of 0 throughout and a pain level of 0 following the procedure. Post Debridement Measurements: 0.7cm length x 0.4cm width x 0.3cm depth; 0.066cm^3 volume. Character of Wound/Ulcer Post Debridement is improved. Post procedure Diagnosis Wound #1: Same as Pre-Procedure Plan Wound Cleansing: Wound #1 Left,Medial Malleolus: Cleanse wound with mild soap and water May Shower, gently pat wound dry prior to applying new dressing. Anesthetic: Wound #1 Left,Medial Malleolus: Topical Lidocaine 4% cream applied to wound bed prior to debridement Primary Wound Dressing: Wound #1 Left,Medial Malleolus: Prisma Ag Secondary Dressing: Wound #1 Left,Medial Malleolus: Dry Gauze Boardered Foam Dressing Jeffery Simon, Jeffery Simon ([id number]) Dressing Change  Frequency: Wound #1 Left,Medial Malleolus: Change dressing every day. Follow-up Appointments: Wound #1 Left,Medial Malleolus: Return Appointment in 1 week. Edema Control: Wound #1 Left,Medial Malleolus: Patient to wear own compression stockings - Please get 30-79mmHg compression socks from [place] and wear them daily from morning until bedtime Elevate legs to the level of the heart and pump ankles as often as possible Additional Orders / Instructions: Wound #1 Left,Medial Malleolus: Increase protein intake. Activity as tolerated General Notes: We will continue with Prisma and Allevyn for the next week. We will then see him for follow- up visit about time to see were things stand. If anything worsens in the interim he will contact our office for additional recommendations. Electronic Signature(s) Signed: 07/30/2016 4:55:59 PM By: Worthy Keeler PA-C Entered By: Worthy Keeler on 07/30/2016 16:55:31 Jeffery Simon ([id number]) -------------------------------------------------------------------------------- SuperBill Details Patient Name: Jeffery Simon. Date of Service: 07/30/2016 Medical Record Number: [id number] Patient Account Number: [id number] Date of Birth/Sex: May 03, 1965 (51 y.o. 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

## 2016-08-06 ENCOUNTER — Encounter: Payer: BLUE CROSS/BLUE SHIELD | Admitting: Physician Assistant

## 2016-08-06 DIAGNOSIS — L97322 Non-pressure chronic ulcer of left ankle with fat layer exposed: Secondary | ICD-10-CM | POA: Diagnosis not present

## 2016-08-06 DIAGNOSIS — L97323 Non-pressure chronic ulcer of left ankle with necrosis of muscle: Secondary | ICD-10-CM | POA: Diagnosis not present

## 2016-08-06 DIAGNOSIS — I87332 Chronic venous hypertension (idiopathic) with ulcer and inflammation of left lower extremity: Secondary | ICD-10-CM | POA: Diagnosis not present

## 2016-08-06 DIAGNOSIS — G4733 Obstructive sleep apnea (adult) (pediatric): Secondary | ICD-10-CM | POA: Diagnosis not present

## 2016-08-06 DIAGNOSIS — Z6841 Body Mass Index (BMI) 40.0 and over, adult: Secondary | ICD-10-CM | POA: Diagnosis not present

## 2016-08-06 DIAGNOSIS — I10 Essential (primary) hypertension: Secondary | ICD-10-CM | POA: Diagnosis not present

## 2016-08-06 DIAGNOSIS — T8131XD Disruption of external operation (surgical) wound, not elsewhere classified, subsequent encounter: Secondary | ICD-10-CM | POA: Diagnosis not present

## 2016-08-08 NOTE — Progress Notes (Signed)
Jeffery Simon, Jeffery Simon ([id number]) Visit Report for 08/06/2016 Chief Complaint Document Details Patient Name: Jeffery Simon, Jeffery Simon. Date of Service: 08/06/2016 2:30 PM Medical Record Number: [id number] Patient Account Number: [id number] Date of Birth/Sex: Apr 29, 1965 (51 y.o. Male) Treating RN: Montey Hora Primary Care Provider: Greig Right Other Clinician: Referring Provider: Greig Right Treating Provider/Extender: Melburn Hake, Donie Lemelin Weeks in Treatment: 13 Information Obtained from: Patient Chief Complaint Patient is here for review of a post surgical wound on the left medial ankle Electronic Signature(s) Signed: 08/06/2016 6:13:19 PM By: Worthy Keeler PA-C Entered By: Worthy Keeler on 08/06/2016 15:07:47 Jeffery Simon ([id number]) -------------------------------------------------------------------------------- HPI Details Patient Name: Jeffery Simon. Date of Service: 08/06/2016 2:30 PM Medical Record Number: [id number] Patient Account Number: [id number] Date of Birth/Sex: 1965/01/27 (51 y.o. Male) Treating RN: Montey Hora Primary Care Provider: Greig Right Other Clinician: Referring Provider: Greig Right Treating Provider/Extender: Melburn Hake, Amilia Vandenbrink Weeks in Treatment: 13 History of Present Illness HPI Description: 05/04/16; this is a 51 year old nondiabetic non-smoking man who is here for review of a wound on the left medial ankle. He has severe hypertension, severe obstructive sleep apnea on CPAP and morbid obesity. Is a problem apparently began in December with small veins that ruptured causing copious amounts of bleeding. He followed with Dr. [name] of [facility] underwent laser ablation in the left leg in January and on the right leg later on. He tolerated both of these procedures well. However he was admitted from 04/24/16 through 04/27/16 with cellulitis involving the left ankle. CT scan showed circumferential severe edema with posterior medial ankle fluid collection. He  was given vancomycin and Rocephin and was seen by Dr. [name] and underwent an IandD of the area. Aspiration of the left ankle was negative. Culture showed MRSA and he was discharged on doxycycline which she is still taking and will remain on total next week. He was discharged with iodoform packing to the surgical wound and a Ace wrap that is being applied by his wife. He was treated with vancomycin in the hospital. The patient does not have a prior history of lower extremity wounds although he has chronic venous insufficiency with inflammation and secondary lymphedema. Echocardiogram done in the hospital showed an EF of 50-55%. Grade 2 diastolic dysfunction ABIs in this clinic were 1.03 on the right 0.9 on the left 05/11/16; patient arrives back in clinic today for review of a surgical area on the left medial ankle. He has completed his antibiotics there is no evidence of surrounding infection and no pain. We started silver alginate last week in place of iodoform packing. 05/18/16; the patient's wound is smaller in terms of the orifice and now as 1.3 cm depth versus 1.7 cm last week. He has completed his antibiotics. His insurance did not approve a Snap Vac. The wound is now too small for a standard [employer] electrical Vac. 05/25/16 continues to improve in term of depth. 0.7x0.3x0.8. Ddp down by 0.5cm. original surgical wound 06/01/16; measuring slightly smaller. 0.7x0.3x0.6. still using collagen 06/08/16; filling in slowly. 0.7x0.3x0.4v still using collagen. Travelling to [place] next week 06/25/2016 -- the patient comes today as he has been traveling a lot and was unable to keep his previous appointments with Dr. Dellia Nims. He has not been wearing his compression stockings as advised and his dressing is not changed regularly as he has had a 4-layer compression applied. 07/05/2016 -- he has been traveling a lot but has been able to do his dual layer compression and he is using Santyl. He  has developed a new  superficial ulceration on the anterior part of his left ankle. 07/30/16 on evaluation today patient's wound appears to be doing well. There is no evidence of infection and he has only minimal discomfort. 08/06/16 patient's left medial ankle wound appears to be doing well on evaluation today. He has been tolerating the dressing without complication currently he is utilizing the Prisma dressing. Jeffery Simon, Jeffery Simon ([id number]) Electronic Signature(s) Signed: 08/06/2016 6:13:19 PM By: Worthy Keeler PA-C Entered By: Worthy Keeler on 08/06/2016 15:12:32 Jeffery Simon ([id number]) -------------------------------------------------------------------------------- Physical Exam Details Patient Name: Jeffery Simon. Date of Service: 08/06/2016 2:30 PM Medical Record Number: [id number] Patient Account Number: [id number] Date of Birth/Sex: July 23, 1965 (51 y.o. Male) Treating RN: Montey Hora Primary Care Provider: Greig Right Other Clinician: Referring Provider: Greig Right Treating Provider/Extender: STONE III, Jenee Spaugh Weeks in Treatment: 13 Constitutional Obese and well-hydrated in no acute distress. Respiratory normal breathing without difficulty. clear to auscultation bilaterally. Cardiovascular regular rate and rhythm with normal S1, S2. trace pitting edema of the bilateral lower extremities. Psychiatric this patient is able to make decisions and demonstrates good insight into disease process. Alert and Oriented x 3. pleasant and cooperative. Notes After cleansing the wound it appears to be in excellent shape with a good granular bed. No sharp debridement was required today. Electronic Signature(s) Signed: 08/06/2016 6:13:19 PM By: Worthy Keeler PA-C Entered By: Worthy Keeler on 08/06/2016 15:14:39 Jeffery Simon ([id number]) -------------------------------------------------------------------------------- Physician Orders Details Patient Name: Jeffery Simon. Date of  Service: 08/06/2016 2:30 PM Medical Record Number: [id number] Patient Account Number: [id number] Date of Birth/Sex: September 14, 1965 (51 y.o. 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 Anesthetic Wound #1 Left,Medial Malleolus o Topical Lidocaine 4% cream applied to wound bed prior to debridement Primary Wound Dressing Wound #1 Left,Medial Malleolus o Prisma Ag Secondary Dressing Wound #1 Left,Medial Malleolus o Dry Gauze o Boardered Foam Dressing Dressing Change Frequency Wound #1 Left,Medial Malleolus o Change dressing every day. Follow-up Appointments Wound #1 Left,Medial Malleolus o Return Appointment in 1 week. Edema Control Jeffery Simon, Jeffery Simon ([id number]) Wound #1 Left,Medial Malleolus o Patient to wear own compression stockings - Please get 30-50mmHg compression socks from [employer] and wear them daily from morning until bedtime o Elevate legs to the level of the heart and pump ankles as often as possible Additional Orders / Instructions Wound #1 Left,Medial Malleolus o Increase protein intake. o Activity as tolerated Notes Hi am going to recommend that we continue with the Current wound care measures per above for the next week. We will see patient for reevaluation at that point. If anything worsens  in the interim he will contact our office for additional recommendations. Electronic Signature(s) Signed: 08/06/2016 6:13:19 PM By: Worthy Keeler PA-C Entered By: Worthy Keeler on 08/06/2016 15:15:03 Jeffery Simon ([id number]) -------------------------------------------------------------------------------- Problem List Details Patient Name: Jeffery Simon. Date of Service: 08/06/2016 2:30 PM Medical Record Number: [id number] Patient Account Number: [id number] Date  of Birth/Sex: January 20, 1966 (51 y.o. Male) Treating RN: Montey Hora Primary Care Provider: Greig Right Other Clinician: Referring Provider: Greig Right Treating Provider/Extender: Melburn Hake, Willman Cuny Weeks in Treatment: 13 Active Problems ICD-10 Encounter Code Description Active Date Diagnosis T81.31XD Disruption of external operation (surgical) wound, not 05/04/2016 Yes elsewhere classified, subsequent encounter I87.332 Chronic venous hypertension (idiopathic) with ulcer and 05/04/2016 Yes inflammation of left lower extremity L97.323 Non-pressure chronic ulcer of left ankle with necrosis of 05/04/2016 Yes muscle Inactive Problems Resolved Problems Electronic Signature(s) Signed: 08/06/2016 6:13:19 PM By: Worthy Keeler PA-C Entered By: Worthy Keeler on 08/06/2016 15:07:35 Jeffery Simon ([id number]) -------------------------------------------------------------------------------- Progress Note Details Patient Name: Jeffery Simon. Date of Service: 08/06/2016 2:30 PM Medical Record Number: [id number] Patient Account Number: [id number] Date of Birth/Sex: 1965/10/28 (51 y.o. Male) Treating RN: Montey Hora Primary Care Provider: Greig Right Other Clinician: Referring Provider: Greig Right Treating Provider/Extender: Melburn Hake, Annalicia Renfrew Weeks in Treatment: 13 Subjective Chief Complaint Information obtained from Patient Patient is here for review of a post surgical wound on the left medial  ankle History of Present Illness (HPI) 05/04/16; this is a 51 year old nondiabetic non-smoking man who is here for review of a wound on the left medial ankle. He has severe hypertension, severe obstructive sleep apnea on CPAP and morbid obesity. Is a problem apparently began in December with small veins that ruptured causing copious amounts of bleeding. He followed with Dr. [name] of [facility] underwent laser ablation in the left leg in January and on the right leg later on. He tolerated both of these procedures well. However he was admitted from 04/24/16 through 04/27/16 with cellulitis involving the left ankle. CT scan showed circumferential severe edema with posterior medial ankle fluid collection. He was given vancomycin and Rocephin and was seen by Dr. [name] and underwent an IandD of the area. Aspiration of the left ankle was negative. Culture showed MRSA and he was discharged on doxycycline which she is still taking and will remain on total next week. He was discharged with iodoform packing to the surgical wound and a Ace wrap that is being applied by his wife. He was treated with vancomycin in the hospital. The patient does not have a prior history of lower extremity wounds although he has chronic venous insufficiency with inflammation and secondary lymphedema. Echocardiogram done in the hospital showed an EF of 50-55%. Grade 2 diastolic dysfunction ABIs in this clinic were 1.03 on the right 0.9 on the left 05/11/16; patient arrives back in clinic today for review of a surgical area on the left medial ankle. He has completed his antibiotics there is no evidence of surrounding infection and no pain. We started silver alginate last week in place of iodoform packing. 05/18/16; the patient's wound is smaller in terms of the orifice and now as 1.3 cm depth versus 1.7 cm last week. He has completed his antibiotics. His insurance did not approve a Snap Vac. The wound is now too small for a standard [employer]  electrical Vac. 05/25/16 continues to improve in term of depth. 0.7x0.3x0.8. Ddp down by 0.5cm. original surgical wound 06/01/16; measuring slightly smaller. 0.7x0.3x0.6. still using collagen 06/08/16; filling in slowly. 0.7x0.3x0.4v still using collagen. Travelling to [place] next week 06/25/2016 -- the patient comes today as he has been traveling a lot and was unable to keep his previous appointments with Dr. Dellia Nims. He has not been wearing his compression stockings as advised and his dressing is not changed regularly as he has had a 4-layer compression  applied. 07/05/2016 -- he has been traveling a lot but has been able to do his dual layer compression and he is using Santyl. He has developed a new superficial ulceration on the anterior part of his left ankle. Jeffery Simon, Jeffery Simon ([id number]) 07/30/16 on evaluation today patient's wound appears to be doing well. There is no evidence of infection and he has only minimal discomfort. 08/06/16 patient's left medial ankle wound appears to be doing well on evaluation today. He has been tolerating the dressing without complication currently he is utilizing the Prisma dressing. Objective Constitutional Obese and well-hydrated in no acute distress. Vitals Time Taken: 2:31 PM, Height: 71 in, Weight: 389 lbs, BMI: 54.2, Temperature: 98.1 F, Pulse: 86 bpm, Respiratory Rate: 18 breaths/min, Blood Pressure: 134/78 mmHg. Respiratory normal breathing without difficulty. clear to auscultation bilaterally. Cardiovascular regular rate and rhythm with normal S1, S2. trace pitting edema of the bilateral lower extremities. Psychiatric this patient is able to make decisions and demonstrates good insight into disease process. Alert and Oriented x 3. pleasant and cooperative. General Notes: After cleansing the wound it appears to be in excellent shape with a good granular bed. No sharp debridement was required today. Integumentary (Hair, Skin) Wound #1 status is Open.  Original cause of wound was Gradually Appeared. The wound is located on the Left,Medial Malleolus. The wound measures 0.4cm length x 0.3cm width x 0.2cm depth; 0.094cm^2 area and 0.019cm^3 volume. There is no tunneling or undermining noted. There is a large amount of serous drainage noted. The wound margin is epibole. There is large (67-100%) pink granulation within the wound bed. There is a small (1-33%) amount of necrotic tissue within the wound bed including Adherent Slough. The periwound skin appearance exhibited: Ecchymosis. The periwound skin appearance did not exhibit: Callus, Crepitus, Excoriation, Induration, Rash, Scarring, Dry/Scaly, Maceration, Atrophie Blanche, Cyanosis, Hemosiderin Staining, Mottled, Pallor, Rubor, Erythema. Periwound temperature was noted as No Abnormality. Jeffery Simon, Jeffery Simon. 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 Anesthetic: Wound #1 Left,Medial Malleolus: Topical Lidocaine 4% cream applied to wound bed prior to debridement Primary Wound Dressing: Wound #1 Left,Medial Malleolus: Prisma Ag Secondary Dressing: Wound #1 Left,Medial Malleolus: Dry Gauze Boardered Foam Dressing Dressing Change Frequency: Wound #1 Left,Medial Malleolus: Change dressing every day. Follow-up Appointments: Wound #1 Left,Medial Malleolus: Return Appointment in 1 week. Edema Control: Wound #1 Left,Medial Malleolus: Patient to wear own compression stockings - Please get 30-76mmHg compression socks from [place] and wear them  daily from morning until bedtime Elevate legs to the level of the heart and pump ankles as often as possible Additional Orders / Instructions: Wound #1 Left,Medial Malleolus: Increase protein intake. Activity as tolerated General Notes: Hi am going to recommend that we continue with the Current wound care measures per Jeffery Simon, Jeffery Simon. ([id number]) above for the next week. We will see patient for reevaluation at that point. If anything worsens in the interim he will contact our office for additional recommendations. Electronic Signature(s) Signed: 08/06/2016 6:13:19 PM By: Worthy Keeler PA-C Entered By: Worthy Keeler on 08/06/2016 15:15:13 Jeffery Simon ([id number]) -------------------------------------------------------------------------------- SuperBill Details Patient Name: Jeffery Simon. Date of Service: 08/06/2016  Medical Record Number: [id number] Patient Account Number: [id number] Date of Birth/Sex: 06/28/1965 (51 y.o. 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 Facility Procedures CPT4 Code: [id number] Description: 99213 - WOUND CARE VISIT-LEV 3 EST PT Modifier: Quantity: 1 Physician Procedures CPT4: Description Modifier Quantity Code [id number] 99213 - WC PHYS LEVEL 3 - EST PT 1 ICD-10 Description Diagnosis T81.31XD Disruption of external operation (surgical) wound, not elsewhere classified, subsequent encounter I87.332 Chronic venous  hypertension (idiopathic) with ulcer and inflammation of left  lower extremity 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

## 2016-08-08 NOTE — Progress Notes (Signed)
Jeffery Simon, Jeffery Simon ([id number]) Visit Report for 08/06/2016 Arrival Information Details Patient Name: Jeffery Simon, Jeffery Simon. Date of Service: 08/06/2016 2:30 PM Medical Record Number: [id number] Patient Account Number: [id number] Date of Birth/Sex: 1965/02/18 (51 y.o. Male) Treating RN: Montey Hora Primary Care Hilmer Aliberti: Greig Right Other Clinician: Referring Natasha Paulson: Greig Right Treating Jamion Carter/Extender: Melburn Hake, HOYT Weeks in Treatment: 13 Visit Information History Since Last Visit Added or deleted any medications: No Patient Arrived: Ambulatory Any new allergies or adverse reactions: No Arrival Time: 14:31 Had a fall or experienced change in No Accompanied By: self activities of daily living that may affect Transfer Assistance: None risk of falls: Patient Identification Verified: Yes Signs or symptoms of abuse/neglect since last No Secondary Verification Process Yes visito Completed: Hospitalized since last visit: No Patient Requires Transmission-Based No Has Dressing in Place as Prescribed: Yes Precautions: Has Compression in Place as Prescribed: Yes Patient Has Alerts: No Pain Present Now: No Electronic Signature(s) Signed: 08/06/2016 4:22:27 PM By: Montey Hora Entered By: Montey Hora on 08/06/2016 14:31:17 Jeffery Simon ([id number]) -------------------------------------------------------------------------------- Clinic Level of Care Assessment Details Patient Name: Jeffery Simon. Date of Service: 08/06/2016 2:30 PM Medical Record Number: [id number] Patient Account Number: [id number] Date of Birth/Sex: 05/08/1965 (51 y.o. Male) Treating RN: Montey Hora Primary Care Baruc Tugwell: Greig Right Other Clinician: Referring Bee Hammerschmidt: Greig Right Treating Caleigha Zale/Extender: Melburn Hake, HOYT Weeks in Treatment: 13 Clinic Level of Care Assessment Items TOOL 4 Quantity Score []  - Use when only an EandM is performed on FOLLOW-UP visit 0 ASSESSMENTS -  Nursing Assessment / Reassessment X - Reassessment of Co-morbidities (includes updates in patient status) 1 10 X - Reassessment of Adherence to Treatment Plan 1 5 ASSESSMENTS - Wound and Skin Assessment / Reassessment X - Simple Wound Assessment / Reassessment - one wound 1 5 []  - Complex Wound Assessment / Reassessment - multiple wounds 0 []  - Dermatologic / Skin Assessment (not related to wound area) 0 ASSESSMENTS - Focused Assessment []  - Circumferential Edema Measurements - multi extremities 0 []  - Nutritional Assessment / Counseling / Intervention 0 X - Lower Extremity Assessment (monofilament, tuning fork, pulses) 1 5 []  - Peripheral Arterial Disease Assessment (using hand held doppler) 0 ASSESSMENTS - Ostomy and/or Continence Assessment and Care []  - Incontinence Assessment and Management 0 []  - Ostomy Care Assessment and Management (repouching, etc.) 0 PROCESS - Coordination of Care X - Simple Patient / Family Education for ongoing care 1 15 []  - Complex (extensive) Patient / Family Education for ongoing care 0 []  - Staff obtains Programmer, systems, Records, Test Results / Process Orders 0 []  - Staff telephones HHA, Nursing Homes / Clarify orders / etc 0 []  - Routine Transfer to another Facility (non-emergent condition) 0 Jeffery Simon, Jeffery Simon ([id number]) []  - Routine Hospital Admission (non-emergent condition) 0 []  - New Admissions / Biomedical engineer / Ordering NPWT, Apligraf, etc. 0 []  - Emergency Hospital Admission (emergent condition) 0 X - Simple Discharge Coordination 1 10 []  - Complex (extensive) Discharge Coordination 0 PROCESS - Special Needs []  - Pediatric / Minor Patient Management 0 []  - Isolation Patient Management 0 []  - Hearing / Language / Visual special needs 0 []  - Assessment of Community assistance (transportation, D/C planning, etc.) 0 []  - Additional assistance / Altered mentation 0 []  - Support Surface(s) Assessment (bed, cushion, seat, etc.) 0 INTERVENTIONS  - Wound Cleansing / Measurement X - Simple Wound Cleansing - one wound 1 5 []  - Complex Wound Cleansing - multiple wounds 0 X - Wound Imaging (photographs -  any number of wounds) 1 5 []  - Wound Tracing (instead of photographs) 0 X - Simple Wound Measurement - one wound 1 5 []  - Complex Wound Measurement - multiple wounds 0 INTERVENTIONS - Wound Dressings X - Small Wound Dressing one or multiple wounds 1 10 []  - Medium Wound Dressing one or multiple wounds 0 []  - Large Wound Dressing one or multiple wounds 0 []  - Application of Medications - topical 0 []  - Application of Medications - injection 0 INTERVENTIONS - Miscellaneous []  - External ear exam 0 Jeffery Simon, Jeffery Simon. ([id number]) []  - Specimen Collection (cultures, biopsies, blood, body fluids, etc.) 0 []  - Specimen(s) / Culture(s) sent or taken to Lab for analysis 0 []  - Patient Transfer (multiple staff / Harrel Lemon Lift / Similar devices) 0 []  - Simple Staple / Suture removal (25 or less) 0 []  - Complex Staple / Suture removal (26 or more) 0 []  - Hypo / Hyperglycemic Management (close monitor of Blood Glucose) 0 []  - Ankle / Brachial Index (ABI) - do not check if billed separately 0 X - Vital Signs 1 5 Has the patient been seen at the hospital within the last three years: Yes Total Score: 80 Level Of Care: New/Established - Level 3 Electronic Signature(s) Signed: 08/06/2016 4:22:27 PM By: Montey Hora Entered By: Montey Hora on 08/06/2016 15:35:16 Jeffery Simon ([id number]) -------------------------------------------------------------------------------- Encounter Discharge Information Details Patient Name: Jeffery Simon. Date of Service: 08/06/2016 2:30 PM Medical Record Number: [id number] Patient Account Number: [id number] Date of Birth/Sex: 03/19/1965 (51 y.o. Male) Treating RN: Montey Hora Primary Care Tanairy Payeur: Greig Right Other Clinician: Referring Harun Brumley: Greig Right Treating Hisham Provence/Extender: Melburn Hake, HOYT Weeks in Treatment: 13 Encounter Discharge Information Items Discharge Pain Level: 0 Discharge Condition: Stable Ambulatory Status: Ambulatory Discharge Destination: Home Private Transportation: Auto Accompanied By: self Schedule Follow-up Appointment: Yes Medication Reconciliation completed and No provided to Patient/Care Britaney Espaillat: Clinical Summary of Care: Electronic Signature(s) Signed: 08/06/2016 4:22:27 PM By: Montey Hora Entered By: Montey Hora on 08/06/2016 15:09:19 Jeffery Simon ([id number]) -------------------------------------------------------------------------------- Lower Extremity Assessment Details Patient Name: Jeffery Simon. Date of Service: 08/06/2016 2:30 PM Medical Record Number: [id number] Patient Account Number: [id number] Date of Birth/Sex: Sep 23, 1965 (51 y.o. Male) Treating RN: Montey Hora Primary Care Jersey Espinoza: Greig Right Other Clinician: Referring Ivannah Zody: Greig Right Treating Antowan Samford/Extender: Melburn Hake, HOYT Weeks in Treatment: 13 Edema Assessment Assessed: [Left: No] [Right: No] E[Left: dema] [Right: :] Calf Left: Right: Point of Measurement: 38 cm From Medial Instep cm cm Ankle Left: Right: Point of Measurement: 12 cm From Medial Instep cm cm Vascular Assessment Pulses: Dorsalis Pedis Palpable: [Left:Yes] Posterior Tibial Extremity colors, hair growth, and conditions: Extremity Color: [Left:Normal] Hair Growth on Extremity: [Left:No] Temperature of Extremity: [Left:Warm] Capillary Refill: [Left:< 3 seconds] Electronic Signature(s) Signed: 08/06/2016 4:22:27 PM By: Montey Hora Entered By: Montey Hora on 08/06/2016 15:08:35 Jeffery Simon ([id number]) -------------------------------------------------------------------------------- Multi Wound Chart Details Patient Name: Jeffery Simon. Date of Service: 08/06/2016 2:30 PM Medical Record Number: [id number] Patient Account Number:  [id number] Date of Birth/Sex: [birth year] (51 y.o. Male) Treating RN: Montey Hora Primary Care Karl Knarr: Greig Right Other Clinician: Referring Hennessy Bartel: Greig Right Treating Anya Murphey/Extender: Melburn Hake, HOYT Weeks in Treatment: 13 Vital Signs Height(in): 71 Pulse(bpm): 86 Weight(lbs): 389 Blood Pressure 134/78 (mmHg): Body Mass Index(BMI): 54 Temperature(F): 98.1 Respiratory Rate 18 (breaths/min): Photos: [1:No Photos] [N/A:N/A] Wound Location: [1:Left Malleolus - Medial] [N/A:N/A] Wounding Event: [1:Gradually Appeared] [N/A:N/A] Primary Etiology: [1:Dehisced Wound] [N/A:N/A] Comorbid History: [1:Sleep Apnea, Hypertension] [N/A:N/A] Date Acquired: [  1:04/25/2016] [N/A:N/A] Weeks of Treatment: [1:13] [N/A:N/A] Wound Status: [1:Open] [N/A:N/A] Measurements L x W x D 0.6x0.4x0.2 [N/A:N/A] (cm) Area (cm) : [1:0.188] [N/A:N/A] Volume (cm) : [1:0.038] [N/A:N/A] % Reduction in Area: [1:60.10%] [N/A:N/A] % Reduction in Volume: 93.30% [N/A:N/A] Classification: [1:Full Thickness Without Exposed Support Structures] [N/A:N/A] Exudate Amount: [1:Large] [N/A:N/A] Exudate Type: [1:Serous] [N/A:N/A] Exudate Color: [1:amber] [N/A:N/A] Wound Margin: [1:Epibole] [N/A:N/A] Granulation Amount: [1:Large (67-100%)] [N/A:N/A] Granulation Quality: [1:Pink] [N/A:N/A] Necrotic Amount: [1:Small (1-33%)] [N/A:N/A] Exposed Structures: [1:Fascia: No Fat Layer (Subcutaneous Tissue) Exposed: No Tendon: No Muscle: No] [N/A:N/A] Joint: No Bone: No Epithelialization: None N/A N/A Periwound Skin Texture: Excoriation: No N/A N/A Induration: No Callus: No Crepitus: No Rash: No Scarring: No Periwound Skin Maceration: No N/A N/A Moisture: Dry/Scaly: No Periwound Skin Color: Ecchymosis: Yes N/A N/A Atrophie Blanche: No Cyanosis: No Erythema: No Hemosiderin Staining: No Mottled: No Pallor: No Rubor: No Temperature: No Abnormality N/A N/A Tenderness on No N/A N/A Palpation: Wound  Preparation: Ulcer Cleansing: N/A N/A Rinsed/Irrigated with Saline Topical Anesthetic Applied: Other: lidocaine 4% Treatment Notes Electronic Signature(s) Signed: 08/06/2016 4:22:27 PM By: Montey Hora Entered By: Montey Hora on 08/06/2016 15:08:58 Jeffery Simon ([id number]) -------------------------------------------------------------------------------- [facility] Details Patient Name: Jeffery Simon. Date of Service: 08/06/2016 2:30 PM Medical Record Number: [id number] Patient Account Number: [id number] Date of Birth/Sex: November 03, 1965 (51 y.o. Male) Treating RN: Montey Hora Primary Care Berthold Glace: Greig Right Other Clinician: Referring Trevor Duty: Greig Right Treating Ivan Lacher/Extender: Melburn Hake, HOYT Weeks in Treatment: 13 Active Inactive ` Orientation to the Wound Care Program Nursing Diagnoses: Knowledge deficit related to the wound healing center program Goals: Patient/caregiver will verbalize understanding of the [facility] Program Date Initiated: 05/04/2016 Target Resolution Date: 09/03/2016 Goal Status: Active Interventions: Provide education on orientation to the wound center Notes: ` Venous Leg Ulcer Nursing Diagnoses: Actual venous Insuffiency (use after diagnosis is confirmed) Potential for venous Insuffiency (use before diagnosis confirmed) Goals: Non-invasive venous studies are completed as ordered Date Initiated: 05/04/2016 Target Resolution Date: 09/03/2016 Goal Status: Active Patient will maintain optimal edema control Date Initiated: 05/04/2016 Target Resolution Date: 09/03/2016 Goal Status: Active Patient/caregiver will verbalize understanding of disease process and disease management Date Initiated: 05/04/2016 Target Resolution Date: 09/03/2016 Goal Status: Active Verify adequate tissue perfusion prior to therapeutic compression application Date Initiated: 05/04/2016 Target Resolution Date: 09/03/2016 Goal  Status: Active Jeffery Simon, Jeffery Simon. ([id number]) Interventions: Assess peripheral edema status every visit. Compression as ordered Provide education on venous insufficiency Treatment Activities: Therapeutic compression applied : 05/04/2016 Notes: ` Wound/Skin Impairment Nursing Diagnoses: Impaired tissue integrity Knowledge deficit related to ulceration/compromised skin integrity Goals: Patient/caregiver will verbalize understanding of skin care regimen Date Initiated: 05/04/2016 Target Resolution Date: 09/03/2016 Goal Status: Active Ulcer/skin breakdown will have a volume reduction of 30% by week 4 Date Initiated: 05/04/2016 Target Resolution Date: 09/03/2016 Goal Status: Active Ulcer/skin breakdown will have a volume reduction of 50% by week 8 Date Initiated: 05/04/2016 Target Resolution Date: 09/03/2016 Goal Status: Active Ulcer/skin breakdown will have a volume reduction of 80% by week 12 Date Initiated: 05/04/2016 Target Resolution Date: 09/03/2016 Goal Status: Active Ulcer/skin breakdown will heal within 14 weeks Date Initiated: 05/04/2016 Target Resolution Date: 09/03/2016 Goal Status: Active Interventions: Assess patient/caregiver ability to obtain necessary supplies Assess patient/caregiver ability to perform ulcer/skin care regimen upon admission and as needed Assess ulceration(s) every visit Provide education on ulcer and skin care Treatment Activities: Referred to DME Ricketta Colantonio for dressing supplies : 05/04/2016 Skin care regimen initiated : 05/04/2016 Topical wound management initiated :  05/04/2016 Jeffery Simon ([id number]) Notes: Electronic Signature(s) Signed: 08/06/2016 4:22:27 PM By: Montey Hora Entered By: Montey Hora on 08/06/2016 15:08:43 Jeffery Simon ([id number]) -------------------------------------------------------------------------------- Pain Assessment Details Patient Name: Jeffery Simon. Date of Service: 08/06/2016 2:30 PM Medical  Record Number: [id number] Patient Account Number: [id number] Date of Birth/Sex: [birth year] (51 y.o. Male) Treating RN: Montey Hora Primary Care Jahmiyah Dullea: Greig Right Other Clinician: Referring Elizeo Rodriques: Greig Right Treating Pallavi Clifton/Extender: Melburn Hake, HOYT Weeks in Treatment: 13 Active Problems Location of Pain Severity and Description of Pain Patient Has Paino No Site Locations Pain Management and Medication Current Pain Management: Notes Topical or injectable lidocaine is offered to patient for acute pain when surgical debridement is performed. If needed, Patient is instructed to use over the counter pain medication for the following 24-48 hours after debridement. Wound care MDs do not prescribed pain medications. Patient has chronic pain or uncontrolled pain. Patient has been instructed to make an appointment with their Primary Care Physician for pain management. Electronic Signature(s) Signed: 08/06/2016 4:22:27 PM By: Montey Hora Entered By: Montey Hora on 08/06/2016 14:31:25 Jeffery Simon ([id number]) -------------------------------------------------------------------------------- Patient/Caregiver Education Details Patient Name: Jeffery Simon. Date of Service: 08/06/2016 2:30 PM Medical Record Number: [id number] Patient Account Number: [id number] Date of Birth/Gender: January 06, 1966 (51 y.o. Male) Treating RN: Montey Hora Primary Care Physician: Greig Right Other Clinician: Referring Physician: Greig Right Treating Physician/Extender: Sharalyn Ink in Treatment: 13 Education Assessment Education Provided To: Patient Education Topics Provided Wound/Skin Impairment: Handouts: Other: wound care as ordered Methods: Demonstration, Explain/Verbal Responses: State content correctly Electronic Signature(s) Signed: 08/06/2016 4:22:27 PM By: Montey Hora Entered By: Montey Hora on 08/06/2016 15:09:35 Jeffery Simon  ([id number]) -------------------------------------------------------------------------------- Wound Assessment Details Patient Name: Jeffery Simon. Date of Service: 08/06/2016 2:30 PM Medical Record Number: [id number] Patient Account Number: [id number] Date of Birth/Sex: 01/10/1966 (51 y.o. Male) Treating RN: Montey Hora Primary Care Deniece Rankin: Greig Right Other Clinician: Referring Zyniah Ferraiolo: Greig Right Treating Sumie Remsen/Extender: Melburn Hake, HOYT Weeks in Treatment: 13 Wound Status Wound Number: 1 Primary Etiology: Dehisced Wound Wound Location: Left, Medial Malleolus Wound Status: Open Wounding Event: Gradually Appeared Comorbid History: Sleep Apnea, Hypertension Date Acquired: 04/25/2016 Weeks Of Treatment: 13 Clustered Wound: No Photos Photo Uploaded By: Montey Hora on 08/06/2016 16:14:49 Wound Measurements Length: (cm) 0.4 Width: (cm) 0.3 Depth: (cm) 0.2 Area: (cm) 0.094 Volume: (cm) 0.019 % Reduction in Area: 80% % Reduction in Volume: 96.6% Epithelialization: None Tunneling: No Undermining: No Wound Description Full Thickness Without Exposed Classification: Support Structures Wound Margin: Epibole Exudate Large Amount: Exudate Type: Serous Exudate Color: amber Foul Odor After Cleansing: No Slough/Fibrino Yes Wound Bed Granulation Amount: Large (67-100%) Exposed Structure Granulation Quality: Pink Fascia Exposed: No Necrotic Amount: Small (1-33%) Fat Layer (Subcutaneous Tissue) Exposed: No Jeffery Simon, Jeffery Simon Kitchen ([id number]) Necrotic Quality: Adherent Slough Tendon Exposed: No Muscle Exposed: No Joint Exposed: No Bone Exposed: No Periwound Skin Texture Texture Color No Abnormalities Noted: No No Abnormalities Noted: No Callus: No Atrophie Blanche: No Crepitus: No Cyanosis: No Excoriation: No Ecchymosis: Yes Induration: No Erythema: No Rash: No Hemosiderin Staining: No Scarring: No Mottled: No Pallor: No Moisture Rubor: No No  Abnormalities Noted: No Dry / Scaly: No Temperature / Pain Maceration: No Temperature: No Abnormality Wound Preparation Ulcer Cleansing: Rinsed/Irrigated with Saline Topical Anesthetic Applied: Other: lidocaine 4%, Treatment Notes Wound #1 (Left, Medial Malleolus) 1. Cleansed with: Clean wound with Normal Saline 2. Anesthetic Topical Lidocaine 4% cream to wound bed prior to debridement 4. Dressing Applied: Prisma Ag  5. Secondary Dressing Applied Bordered Foam Dressing Electronic Signature(s) Signed: 08/06/2016 4:22:27 PM By: Montey Hora Entered By: Montey Hora on 08/06/2016 15:10:24 Jeffery Simon ([id number]) -------------------------------------------------------------------------------- [place] Details Patient Name: Jeffery Simon. Date of Service: 08/06/2016 2:30 PM Medical Record Number: [id number] Patient Account Number: [id number] Date of Birth/Sex: 1965/09/26 (51 y.o. Male) Treating RN: Montey Hora Primary Care Alawna Graybeal: Greig Right Other Clinician: Referring Elchonon Maxson: Greig Right Treating Lathyn Griggs/Extender: Melburn Hake, HOYT Weeks in Treatment: 13 Vital Signs Time Taken: 14:31 Temperature (F): 98.1 Height (in): 71 Pulse (bpm): 86 Weight (lbs): 389 Respiratory Rate (breaths/min): 18 Body Mass Index (BMI): 54.2 Blood Pressure (mmHg): 134/78 Reference Range: 80 - 120 mg / dl Electronic Signature(s) Signed: 08/06/2016 4:22:27 PM By: Montey Hora Entered By: Montey Hora on 08/06/2016 14:33:34

## 2016-08-13 ENCOUNTER — Encounter: Payer: BLUE CROSS/BLUE SHIELD | Admitting: Surgery

## 2016-08-13 DIAGNOSIS — T8131XD Disruption of external operation (surgical) wound, not elsewhere classified, subsequent encounter: Secondary | ICD-10-CM | POA: Diagnosis not present

## 2016-08-13 DIAGNOSIS — I87332 Chronic venous hypertension (idiopathic) with ulcer and inflammation of left lower extremity: Secondary | ICD-10-CM | POA: Diagnosis not present

## 2016-08-13 DIAGNOSIS — Z6841 Body Mass Index (BMI) 40.0 and over, adult: Secondary | ICD-10-CM | POA: Diagnosis not present

## 2016-08-13 DIAGNOSIS — I10 Essential (primary) hypertension: Secondary | ICD-10-CM | POA: Diagnosis not present

## 2016-08-13 DIAGNOSIS — L97323 Non-pressure chronic ulcer of left ankle with necrosis of muscle: Secondary | ICD-10-CM | POA: Diagnosis not present

## 2016-08-13 DIAGNOSIS — G4733 Obstructive sleep apnea (adult) (pediatric): Secondary | ICD-10-CM | POA: Diagnosis not present

## 2016-08-13 DIAGNOSIS — L97322 Non-pressure chronic ulcer of left ankle with fat layer exposed: Secondary | ICD-10-CM | POA: Diagnosis not present

## 2016-08-15 NOTE — Progress Notes (Signed)
Jeffery Simon, Jeffery Simon ([id number]) Visit Report for 08/13/2016 Chief Complaint Document Details Patient Name: Jeffery Simon, Jeffery Simon. Date of Service: 08/13/2016 2:30 PM Medical Record Number: [id number] Patient Account Number: [id number] Date of Birth/Sex: 1965-06-16 (51 y.o. Male) Treating RN: Montey Hora Primary Care Provider: Greig Right Other Clinician: Referring Provider: Greig Right Treating Provider/Extender: Frann Rider in Treatment: 14 Information Obtained from: Patient Chief Complaint Patient is here for review of a post surgical wound on the left medial ankle Electronic Signature(s) Signed: 08/13/2016 2:52:45 PM By: Christin Fudge MD, FACS Entered By: Christin Fudge on 08/13/2016 14:52:44 Jeffery Simon ([id number]) -------------------------------------------------------------------------------- HPI Details Patient Name: Jeffery Simon. Date of Service: 08/13/2016 2:30 PM Medical Record Number: [id number] Patient Account Number: [id number] Date of Birth/Sex: [birth year] (51 y.o. Male) Treating RN: Montey Hora Primary Care Provider: Greig Right Other Clinician: Referring Provider: Greig Right Treating Provider/Extender: Frann Rider in Treatment: 14 History of Present Illness HPI Description: 05/04/16; this is a 51 year old nondiabetic non-smoking man who is here for review of a wound on the left medial ankle. He has severe hypertension, severe obstructive sleep apnea on CPAP and morbid obesity. Is a problem apparently began in December with small veins that ruptured causing copious amounts of bleeding. He followed with Dr. [name] of [facility] underwent laser ablation in the left leg in January and on the right leg later on. He tolerated both of these procedures well. However he was admitted from 04/24/16 through 04/27/16 with cellulitis involving the left ankle. CT scan showed circumferential severe edema with posterior medial ankle fluid collection. He was  given vancomycin and Rocephin and was seen by Dr. [name] and underwent an IandD of the area. Aspiration of the left ankle was negative. Culture showed MRSA and he was discharged on doxycycline which she is still taking and will remain on total next week. He was discharged with iodoform packing to the surgical wound and a Ace wrap that is being applied by his wife. He was treated with vancomycin in the hospital. The patient does not have a prior history of lower extremity wounds although he has chronic venous insufficiency with inflammation and secondary lymphedema. Echocardiogram done in the hospital showed an EF of 50-55%. Grade 2 diastolic dysfunction ABIs in this clinic were 1.03 on the right 0.9 on the left 05/11/16; patient arrives back in clinic today for review of a surgical area on the left medial ankle. He has completed his antibiotics there is no evidence of surrounding infection and no pain. We started silver alginate last week in place of iodoform packing. 05/18/16; the patient's wound is smaller in terms of the orifice and now as 1.3 cm depth versus 1.7 cm last week. He has completed his antibiotics. His insurance did not approve a Snap Vac. The wound is now too small for a standard [employer] electrical Vac. 05/25/16 continues to improve in term of depth. 0.7x0.3x0.8. Ddp down by 0.5cm. original surgical wound 06/01/16; measuring slightly smaller. 0.7x0.3x0.6. still using collagen 06/08/16; filling in slowly. 0.7x0.3x0.4v still using collagen. Travelling to [place] next week 06/25/2016 -- the patient comes today as he has been traveling a lot and was unable to keep his previous appointments with Dr. Dellia Nims. He has not been wearing his compression stockings as advised and his dressing is not changed regularly as he has had a 4-layer compression applied. 07/05/2016 -- he has been traveling a lot but has been able to do his dual layer compression and he is using Santyl. He has developed a  new  superficial ulceration on the anterior part of his left ankle. 07/30/16 on evaluation today patient's wound appears to be doing well. There is no evidence of infection and he has only minimal discomfort. 08/06/16 patient's left medial ankle wound appears to be doing well on evaluation today. He has been tolerating the dressing without complication currently he is utilizing the Prisma dressing. Jeffery Simon, Jeffery Simon ([id number]) Electronic Signature(s) Signed: 08/13/2016 2:52:58 PM By: Christin Fudge MD, FACS Entered By: Christin Fudge on 08/13/2016 14:52:57 Jeffery Simon ([id number]) -------------------------------------------------------------------------------- Physical Exam Details Patient Name: Jeffery Simon. Date of Service: 08/13/2016 2:30 PM Medical Record Number: [id number] Patient Account Number: [id number] Date of Birth/Sex: 11-25-1965 (51 y.o. Male) Treating RN: Montey Hora Primary Care Provider: Greig Right Other Clinician: Referring Provider: Greig Right Treating Provider/Extender: Frann Rider in Treatment: 14 Constitutional . Pulse regular. Respirations normal and unlabored. Afebrile. . Eyes Nonicteric. Reactive to light. Ears, Nose, Mouth, and Throat Lips, teeth, and gums WNL.Marland Kitchen Moist mucosa without lesions. Neck supple and nontender. No palpable supraclavicular or cervical adenopathy. Normal sized without goiter. Respiratory WNL. No retractions.. Breath sounds WNL, No rubs, rales, rhonchi, or wheeze.. Cardiovascular Heart rhythm and rate regular, no murmur or gallop.. Pedal Pulses WNL. No clubbing, cyanosis or edema. Lymphatic No adneopathy. No adenopathy. No adenopathy. Musculoskeletal Adexa without tenderness or enlargement.. Digits and nails w/o clubbing, cyanosis, infection, petechiae, ischemia, or inflammatory conditions.. Integumentary (Hair, Skin) No suspicious lesions. No crepitus or fluctuance. No peri-wound warmth or erythema. No  masses.Marland Kitchen Psychiatric Judgement and insight Intact.. No evidence of depression, anxiety, or agitation.. Notes the wound was irrigated and cleansed with a Q-tip and the base is looking very clean and at this stage no debridement was required Electronic Signature(s) Signed: 08/13/2016 2:53:23 PM By: Christin Fudge MD, FACS Entered By: Christin Fudge on 08/13/2016 14:53:22 Jeffery Simon ([id number]) -------------------------------------------------------------------------------- Physician Orders Details Patient Name: Jeffery Simon. Date of Service: 08/13/2016 2:30 PM Medical Record Number: [id number] Patient Account Number: [id number] Date of Birth/Sex: [birth year] (51 y.o. Male) Treating RN: Montey Hora Primary Care Provider: Greig Right Other Clinician: Referring Provider: Greig Right Treating Provider/Extender: Frann Rider in Treatment: 57 Verbal / Phone Orders: No Diagnosis Coding Wound Cleansing Wound #1 Left,Medial Malleolus o Cleanse wound with mild soap and water o May Shower, gently pat wound dry prior to applying new dressing. Anesthetic Wound #1 Left,Medial Malleolus o Topical Lidocaine 4% cream applied to wound bed prior to debridement Primary Wound Dressing Wound #1 Left,Medial Malleolus o Hydrogel o Other: - sorbact Secondary Dressing Wound #1 Left,Medial Malleolus o Dry Gauze o Boardered Foam Dressing Dressing Change Frequency Wound #1 Left,Medial Malleolus o Change dressing every day. Follow-up Appointments Wound #1 Left,Medial Malleolus o Return Appointment in 1 week. Edema Control Wound #1 Left,Medial Malleolus o Patient to wear own compression stockings - Please get 30-84mmHg compression socks from [place] and wear them daily from morning until bedtime o Elevate legs to the level of the heart and pump ankles as often as possible Additional Orders / Instructions Wound #1 Left,Medial Malleolus Jeffery Simon, Jeffery Simon.  ([id number]) o Increase protein intake. o Activity as tolerated Electronic Signature(s) Signed: 08/13/2016 4:13:09 PM By: Christin Fudge MD, FACS Signed: 08/13/2016 4:19:15 PM By: Montey Hora Entered By: Montey Hora on 08/13/2016 14:49:51 Jeffery Simon ([id number]) -------------------------------------------------------------------------------- Problem List Details Patient Name: JADIE, COMAS. Date of Service: 08/13/2016 2:30 PM Medical Record Number: [id number] Patient Account Number: [id number] Date of Birth/Sex: 04-23-1965 (51 y.o. 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 Date of Service: 08/13/2016 2:30 PM Medical Record Number: [id number] Patient Account Number: [id number] Date of Birth/Sex: [birth year] (51 y.o. Male) Treating RN: Montey Hora Primary Care Provider: Greig Right Other Clinician: Referring Provider: Greig Right Treating Provider/Extender: Frann Rider in Treatment: 14 Subjective Chief Complaint Information obtained from Patient Patient is here for review of a post  surgical wound on the left medial ankle History of Present Illness (HPI) 05/04/16; this is a 51 year old nondiabetic non-smoking man who is here for review of a wound on the left medial ankle. He has severe hypertension, severe obstructive sleep apnea on CPAP and morbid obesity. Is a problem apparently began in December with small veins that ruptured causing copious amounts of bleeding. He followed with Dr. [name] of [facility] underwent laser ablation in the left leg in January and on the right leg later on. He tolerated both of these procedures well. However he was admitted from 04/24/16 through 04/27/16 with cellulitis involving the left ankle. CT scan showed circumferential severe edema with posterior medial ankle fluid collection. He was given vancomycin and Rocephin and was seen by Dr. [name] and underwent an IandD of the area. Aspiration of the left ankle was negative. Culture showed MRSA and he was discharged on doxycycline which she is still taking and will remain on total next week. He was discharged with iodoform packing to the surgical wound and a Ace wrap that is being applied by his wife. He was treated with vancomycin in the hospital. The patient does not have a prior history of lower extremity wounds although he has chronic venous insufficiency with inflammation and secondary lymphedema. Echocardiogram done in the hospital showed an EF of 50-55%. Grade 2 diastolic dysfunction ABIs in this clinic were 1.03 on the right 0.9 on the left 05/11/16; patient arrives back in clinic today for review of a surgical area on the left medial ankle. He has completed his antibiotics there is no evidence of surrounding infection and no pain. We started silver alginate last week in place of iodoform packing. 05/18/16; the patient's wound is smaller in terms of the orifice and now as 1.3 cm depth versus 1.7 cm last week. He has completed his antibiotics. His insurance did not approve a Snap Vac. The wound is  now too small for a standard [employer] electrical Vac. 05/25/16 continues to improve in term of depth. 0.7x0.3x0.8. Ddp down by 0.5cm. original surgical wound 06/01/16; measuring slightly smaller. 0.7x0.3x0.6. still using collagen 06/08/16; filling in slowly. 0.7x0.3x0.4v still using collagen. Travelling to [place] next week 06/25/2016 -- the patient comes today as he has been traveling a lot and was unable to keep his previous appointments with Dr. Dellia Nims. He has not been wearing his compression stockings as advised and his dressing is not changed regularly as he has had a 4-layer compression applied. 07/05/2016 -- he has been traveling a lot but has been  able to do his dual layer compression and he is using Santyl. He has developed a new superficial ulceration on the anterior part of his left ankle. Jeffery Simon, Jeffery Simon ([id number]) 07/30/16 on evaluation today patient's wound appears to be doing well. There is no evidence of infection and he has only minimal discomfort. 08/06/16 patient's left medial ankle wound appears to be doing well on evaluation today. He has been tolerating the dressing without complication currently he is utilizing the Prisma dressing. Objective Constitutional Pulse regular. Respirations normal and unlabored. Afebrile. Vitals Time Taken: 2:35 PM, Height: 71 in, Weight: 389 lbs, BMI: 54.2, Temperature: 98.4 F, Pulse: 75 bpm, Respiratory Rate: 18 breaths/min, Blood Pressure: 125/60 mmHg. Eyes Nonicteric. Reactive to light. Ears, Nose, Mouth, and Throat Lips, teeth, and gums WNL.Marland Kitchen Moist mucosa without lesions. Neck supple and nontender. No palpable supraclavicular or cervical adenopathy. Normal sized without goiter. Respiratory WNL. No retractions.. Breath sounds WNL, No rubs, rales, rhonchi, or wheeze.. Cardiovascular Heart rhythm and rate regular, no murmur or gallop.. Pedal Pulses WNL. No clubbing, cyanosis or edema. Lymphatic No adneopathy. No adenopathy. No  adenopathy. Musculoskeletal Adexa without tenderness or enlargement.. Digits and nails w/o clubbing, cyanosis, infection, petechiae, ischemia, or inflammatory conditions.Marland Kitchen Psychiatric Judgement and insight Intact.. No evidence of depression, anxiety, or agitation.. General Notes: the wound was irrigated and cleansed with a Q-tip and the base is looking very clean and at Jeffery Simon, Jeffery Simon. ([id number]) this stage no debridement was required Integumentary (Hair, Skin) No suspicious lesions. No crepitus or fluctuance. No peri-wound warmth or erythema. No masses.. Wound #1 status is Open. Original cause of wound was Gradually Appeared. The wound is located on the Left,Medial Malleolus. The wound measures 0.4cm length x 0.2cm width x 0.1cm depth; 0.063cm^2 area and 0.006cm^3 volume. There is no tunneling or undermining noted. There is a large amount of serous drainage noted. The wound margin is epibole. There is large (67-100%) pink granulation within the wound bed. There is no necrotic tissue within the wound bed. The periwound skin appearance exhibited: Ecchymosis. The periwound skin appearance did not exhibit: Callus, Crepitus, Excoriation, Induration, Rash, Scarring, Dry/Scaly, Maceration, Atrophie Blanche, Cyanosis, Hemosiderin Staining, Mottled, Pallor, Rubor, Erythema. Periwound temperature was noted as No Abnormality. 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 Anesthetic: Wound #1 Left,Medial Malleolus: Topical Lidocaine 4% cream applied to wound bed  prior to debridement Primary Wound Dressing: Wound #1 Left,Medial Malleolus: Hydrogel Other: - sorbact Secondary Dressing: Wound #1 Left,Medial Malleolus: Dry Gauze Boardered Foam Dressing Dressing Change FrequencyEYDEN, Jeffery Simon ([id number]) Wound #1 Left,Medial Malleolus: Change dressing every day. Follow-up Appointments: Wound #1 Left,Medial Malleolus: Return Appointment in 1 week. Edema Control: Wound #1 Left,Medial Malleolus: Patient to wear own compression stockings - Please get 30-46mmHg compression socks from [place] and wear them daily from morning until bedtime Elevate legs to the level of the heart and pump ankles as often as possible Additional Orders / Instructions: Wound #1 Left,Medial Malleolus: Increase protein intake. 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  We'll use Sorbact with hydrogel over the small superficial wound. I anticipate discharge from the wound clinic soon Electronic Signature(s) Signed: 08/13/2016 2:54:37 PM By: Christin Fudge MD, FACS Entered By: Christin Fudge on 08/13/2016 14:54:36 Jeffery Simon ([id number]) -------------------------------------------------------------------------------- SuperBill Details Patient Name: Jeffery Simon. Date of Service: 08/13/2016 Medical Record Number: [id number] Patient Account Number: [id number] Date of Birth/Sex: 1965/02/13 (51 y.o. 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 Facility Procedures CPT4 Code: [id number] Description: 99213 - WOUND CARE VISIT-LEV 3 EST PT Modifier: Quantity: 1 Physician Procedures CPT4: Description Modifier Quantity Code [id number] 99213 - WC PHYS LEVEL 3 - EST PT 1 ICD-10 Description Diagnosis T81.31XD Disruption of external operation (surgical) wound, not elsewhere classified, subsequent encounter I87.332 Chronic venous  hypertension (idiopathic) with ulcer and inflammation of left lower extremity 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

## 2016-08-16 NOTE — Progress Notes (Signed)
ANTIONE, OBAR ([id number]) Visit Report for 08/13/2016 Arrival Information Details Patient Name: Jeffery Simon, Jeffery Simon. Date of Service: 08/13/2016 2:30 PM Medical Record Number: [id number] Patient Account Number: [id number] Date of Birth/Sex: 07/26/1965 (51 y.o. Male) Treating RN: Montey Hora Primary Care Seab Axel: Greig Right Other Clinician: Referring Genie Mirabal: Greig Right Treating Emmaclaire Switala/Extender: Frann Rider in Treatment: 14 Visit Information History Since Last Visit Added or deleted any medications: No Patient Arrived: Ambulatory Any new allergies or adverse reactions: No Arrival Time: 14:32 Had a fall or experienced change in No Accompanied By: spouse activities of daily living that may affect Transfer Assistance: None risk of falls: Patient Identification Verified: Yes Signs or symptoms of abuse/neglect since last No Secondary Verification Process Yes visito Completed: Hospitalized since last visit: No Patient Requires Transmission-Based No Has Dressing in Place as Prescribed: Yes Precautions: Has Compression in Place as Prescribed: Yes Patient Has Alerts: No Pain Present Now: No Electronic Signature(s) Signed: 08/13/2016 4:19:15 PM By: Montey Hora Entered By: Montey Hora on 08/13/2016 14:33:01 Jeffery Simon ([id number]) -------------------------------------------------------------------------------- Clinic Level of Care Assessment Details Patient Name: Jeffery Simon. Date of Service: 08/13/2016 2:30 PM Medical Record Number: [id number] Patient Account Number: [id number] Date of Birth/Sex: 11/16/1965 (51 y.o. Male) Treating RN: Montey Hora Primary Care Brooke Payes: Greig Right Other Clinician: Referring Sherby Moncayo: Greig Right Treating Luisangel Wainright/Extender: Frann Rider in Treatment: 14 Clinic Level of Care Assessment Items TOOL 4 Quantity Score []  - Use when only an EandM is performed on FOLLOW-UP visit 0 ASSESSMENTS -  Nursing Assessment / Reassessment X - Reassessment of Co-morbidities (includes updates in patient status) 1 10 X - Reassessment of Adherence to Treatment Plan 1 5 ASSESSMENTS - Wound and Skin Assessment / Reassessment X - Simple Wound Assessment / Reassessment - one wound 1 5 []  - Complex Wound Assessment / Reassessment - multiple wounds 0 []  - Dermatologic / Skin Assessment (not related to wound area) 0 ASSESSMENTS - Focused Assessment []  - Circumferential Edema Measurements - multi extremities 0 []  - Nutritional Assessment / Counseling / Intervention 0 X - Lower Extremity Assessment (monofilament, tuning fork, pulses) 1 5 []  - Peripheral Arterial Disease Assessment (using hand held doppler) 0 ASSESSMENTS - Ostomy and/or Continence Assessment and Care []  - Incontinence Assessment and Management 0 []  - Ostomy Care Assessment and Management (repouching, etc.) 0 PROCESS - Coordination of Care X - Simple Patient / Family Education for ongoing care 1 15 []  - Complex (extensive) Patient / Family Education for ongoing care 0 []  - Staff obtains Programmer, systems, Records, Test Results / Process Orders 0 []  - Staff telephones HHA, Nursing Homes / Clarify orders / etc 0 []  - Routine Transfer to another Facility (non-emergent condition) 0 FORD, PEDDIE. ([id number]) []  - Routine Hospital Admission (non-emergent condition) 0 []  - New Admissions / Biomedical engineer / Ordering NPWT, Apligraf, etc. 0 []  - Emergency Hospital Admission (emergent condition) 0 X - Simple Discharge Coordination 1 10 []  - Complex (extensive) Discharge Coordination 0 PROCESS - Special Needs []  - Pediatric / Minor Patient Management 0 []  - Isolation Patient Management 0 []  - Hearing / Language / Visual special needs 0 []  - Assessment of Community assistance (transportation, D/C planning, etc.) 0 []  - Additional assistance / Altered mentation 0 []  - Support Surface(s) Assessment (bed, cushion, seat, etc.) 0 INTERVENTIONS  - Wound Cleansing / Measurement X - Simple Wound Cleansing - one wound 1 5 []  - Complex Wound Cleansing - multiple wounds 0 X - Wound Imaging (photographs - any  number of wounds) 1 5 []  - Wound Tracing (instead of photographs) 0 X - Simple Wound Measurement - one wound 1 5 []  - Complex Wound Measurement - multiple wounds 0 INTERVENTIONS - Wound Dressings X - Small Wound Dressing one or multiple wounds 1 10 []  - Medium Wound Dressing one or multiple wounds 0 []  - Large Wound Dressing one or multiple wounds 0 []  - Application of Medications - topical 0 []  - Application of Medications - injection 0 INTERVENTIONS - Miscellaneous []  - External ear exam 0 RASHED, EDLER. ([id number]) []  - Specimen Collection (cultures, biopsies, blood, body fluids, etc.) 0 []  - Specimen(s) / Culture(s) sent or taken to Lab for analysis 0 []  - Patient Transfer (multiple staff / Harrel Lemon Lift / Similar devices) 0 []  - Simple Staple / Suture removal (25 or less) 0 []  - Complex Staple / Suture removal (26 or more) 0 []  - Hypo / Hyperglycemic Management (close monitor of Blood Glucose) 0 []  - Ankle / Brachial Index (ABI) - do not check if billed separately 0 X - Vital Signs 1 5 Has the patient been seen at the hospital within the last three years: Yes Total Score: 80 Level Of Care: New/Established - Level 3 Electronic Signature(s) Signed: 08/13/2016 4:19:15 PM By: Montey Hora Entered By: Montey Hora on 08/13/2016 15:48:38 Jeffery Simon ([id number]) -------------------------------------------------------------------------------- Encounter Discharge Information Details Patient Name: Jeffery Simon. Date of Service: 08/13/2016 2:30 PM Medical Record Number: [id number] Patient Account Number: [id number] Date of Birth/Sex: 08-24-1965 (51 y.o. Male) Treating RN: Montey Hora Primary Care Riaz Onorato: Greig Right Other Clinician: Referring Chekesha Behlke: Greig Right Treating Lilyannah Zuelke/Extender: Frann Rider in Treatment: 14 Encounter Discharge Information Items Discharge Pain Level: 0 Discharge Condition: Stable Ambulatory Status: Ambulatory Discharge Destination: Home Transportation: Private Auto Accompanied By: spouse Schedule Follow-up Appointment: Yes Medication Reconciliation completed No and provided to Patient/Care Sharita Bienaime: Patient Clinical Summary of Care: Declined Electronic Signature(s) Signed: 08/13/2016 2:59:48 PM By: Ruthine Dose Entered By: Ruthine Dose on 08/13/2016 14:59:48 Jeffery Simon ([id number]) -------------------------------------------------------------------------------- Lower Extremity Assessment Details Patient Name: Jeffery Simon. Date of Service: 08/13/2016 2:30 PM Medical Record Number: [id number] Patient Account Number: [id number] Date of Birth/Sex: Oct 03, 1965 (51 y.o. Male) Treating RN: Montey Hora Primary Care Anaysha Andre: Greig Right Other Clinician: Referring Jeffery Ragin: Greig Right Treating Shauntia Levengood/Extender: Frann Rider in Treatment: 14 Edema Assessment Assessed: [Left: No] [Right: No] E[Left: dema] [Right: :] Calf Left: Right: Point of Measurement: 38 cm From Medial Instep cm cm Ankle Left: Right: Point of Measurement: 12 cm From Medial Instep cm cm Vascular Assessment Pulses: Dorsalis Pedis Palpable: [Left:Yes] Posterior Tibial Extremity colors, hair growth, and conditions: Extremity Color: [Left:Hyperpigmented] Hair Growth on Extremity: [Left:No] Temperature of Extremity: [Left:Warm] Capillary Refill: [Left:< 3 seconds] Electronic Signature(s) Signed: 08/13/2016 4:19:15 PM By: Montey Hora Entered By: Montey Hora on 08/13/2016 14:39:23 Jeffery Simon ([id number]) -------------------------------------------------------------------------------- Multi Wound Chart Details Patient Name: Jeffery Simon. Date of Service: 08/13/2016 2:30 PM Medical Record Number: [id number] Patient Account  Number: [id number] Date of Birth/Sex: December 20, 1965 (51 y.o. Male) Treating RN: Montey Hora Primary Care Edrei Norgaard: Greig Right Other Clinician: Referring Retta Pitcher: Greig Right Treating Lynze Reddy/Extender: Frann Rider in Treatment: 14 Vital Signs Height(in): 71 Pulse(bpm): 75 Weight(lbs): 389 Blood Pressure 125/60 (mmHg): Body Mass Index(BMI): 54 Temperature(F): 98.4 Respiratory Rate 18 (breaths/min): Photos: [1:No Photos] [N/A:N/A] Wound Location: [1:Left Malleolus - Medial] [N/A:N/A] Wounding Event: [1:Gradually Appeared] [N/A:N/A] Primary Etiology: [1:Dehisced Wound] [N/A:N/A] Comorbid History: [1:Sleep Apnea, Hypertension] [N/A:N/A] Date Acquired: [1:04/25/2016] [N/A:N/A]  Weeks of Treatment: [1:14] [N/A:N/A] Wound Status: [1:Open] [N/A:N/A] Measurements L x W x D 0.4x0.2x0.1 [N/A:N/A] (cm) Area (cm) : [1:0.063] [N/A:N/A] Volume (cm) : [1:0.006] [N/A:N/A] % Reduction in Area: [1:86.60%] [N/A:N/A] % Reduction in Volume: 98.90% [N/A:N/A] Classification: [1:Full Thickness Without Exposed Support Structures] [N/A:N/A] Exudate Amount: [1:Large] [N/A:N/A] Exudate Type: [1:Serous] [N/A:N/A] Exudate Color: [1:amber] [N/A:N/A] Wound Margin: [1:Epibole] [N/A:N/A] Granulation Amount: [1:Large (67-100%)] [N/A:N/A] Granulation Quality: [1:Pink] [N/A:N/A] Necrotic Amount: [1:None Present (0%)] [N/A:N/A] Exposed Structures: [1:Fascia: No Fat Layer (Subcutaneous Tissue) Exposed: No Tendon: No Muscle: No] [N/A:N/A] Joint: No Bone: No Epithelialization: Medium (34-66%) N/A N/A Periwound Skin Texture: Excoriation: No N/A N/A Induration: No Callus: No Crepitus: No Rash: No Scarring: No Periwound Skin Maceration: No N/A N/A Moisture: Dry/Scaly: No Periwound Skin Color: Ecchymosis: Yes N/A N/A Atrophie Blanche: No Cyanosis: No Erythema: No Hemosiderin Staining: No Mottled: No Pallor: No Rubor: No Temperature: No Abnormality N/A N/A Tenderness on No N/A  N/A Palpation: Wound Preparation: Ulcer Cleansing: N/A N/A Rinsed/Irrigated with Saline Topical Anesthetic Applied: Other: lidocaine 4% Treatment Notes Electronic Signature(s) Signed: 08/13/2016 2:52:37 PM By: Christin Fudge MD, FACS Entered By: Christin Fudge on 08/13/2016 14:52:37 Jeffery Simon ([id number]) -------------------------------------------------------------------------------- [place] Details Patient Name: Jeffery Simon. Date of Service: 08/13/2016 2:30 PM Medical Record Number: [id number] Patient Account Number: [id number] Date of Birth/Sex: 04-08-1965 (51 y.o. Male) Treating RN: Montey Hora Primary Care Prakriti Carignan: Greig Right Other Clinician: Referring Najeh Credit: Greig Right Treating Shep Porter/Extender: Frann Rider in Treatment: 14 Active Inactive ` Orientation to the Wound Care Program Nursing Diagnoses: Knowledge deficit related to the wound healing center program Goals: Patient/caregiver will verbalize understanding of the [place] Program Date Initiated: 05/04/2016 Target Resolution Date: 09/03/2016 Goal Status: Active Interventions: Provide education on orientation to the wound center Notes: ` Venous Leg Ulcer Nursing Diagnoses: Actual venous Insuffiency (use after diagnosis is confirmed) Potential for venous Insuffiency (use before diagnosis confirmed) Goals: Non-invasive venous studies are completed as ordered Date Initiated: 05/04/2016 Target Resolution Date: 09/03/2016 Goal Status: Active Patient will maintain optimal edema control Date Initiated: 05/04/2016 Target Resolution Date: 09/03/2016 Goal Status: Active Patient/caregiver will verbalize understanding of disease process and disease management Date Initiated: 05/04/2016 Target Resolution Date: 09/03/2016 Goal Status: Active Verify adequate tissue perfusion prior to therapeutic compression application Date Initiated: 05/04/2016 Target  Resolution Date: 09/03/2016 Goal Status: Active CURTIS, CAIN. ([id number]) Interventions: Assess peripheral edema status every visit. Compression as ordered Provide education on venous insufficiency Treatment Activities: Therapeutic compression applied : 05/04/2016 Notes: ` Wound/Skin Impairment Nursing Diagnoses: Impaired tissue integrity Knowledge deficit related to ulceration/compromised skin integrity Goals: Patient/caregiver will verbalize understanding of skin care regimen Date Initiated: 05/04/2016 Target Resolution Date: 09/03/2016 Goal Status: Active Ulcer/skin breakdown will have a volume reduction of 30% by week 4 Date Initiated: 05/04/2016 Target Resolution Date: 09/03/2016 Goal Status: Active Ulcer/skin breakdown will have a volume reduction of 50% by week 8 Date Initiated: 05/04/2016 Target Resolution Date: 09/03/2016 Goal Status: Active Ulcer/skin breakdown will have a volume reduction of 80% by week 12 Date Initiated: 05/04/2016 Target Resolution Date: 09/03/2016 Goal Status: Active Ulcer/skin breakdown will heal within 14 weeks Date Initiated: 05/04/2016 Target Resolution Date: 09/03/2016 Goal Status: Active Interventions: Assess patient/caregiver ability to obtain necessary supplies Assess patient/caregiver ability to perform ulcer/skin care regimen upon admission and as needed Assess ulceration(s) every visit Provide education on ulcer and skin care Treatment Activities: Referred to DME Candy Ziegler for dressing supplies : 05/04/2016 Skin care regimen initiated : 05/04/2016 Topical wound management  initiated : 05/04/2016 LAYTHAN, HAYTER ([id number]) Notes: Electronic Signature(s) Signed: 08/13/2016 4:19:15 PM By: Montey Hora Entered By: Montey Hora on 08/13/2016 14:46:48 Jeffery Simon ([id number]) -------------------------------------------------------------------------------- Pain Assessment Details Patient Name: Jeffery Simon. Date of  Service: 08/13/2016 2:30 PM Medical Record Number: [id number] Patient Account Number: [id number] Date of Birth/Sex: 07/18/1965 (51 y.o. Male) Treating RN: Montey Hora Primary Care Mars Scheaffer: Greig Right Other Clinician: Referring Alyx Mcguirk: Greig Right Treating Sowmya Partridge/Extender: Frann Rider in Treatment: 14 Active Problems Location of Pain Severity and Description of Pain Patient Has Paino No Site Locations Pain Management and Medication Current Pain Management: Notes Topical or injectable lidocaine is offered to patient for acute pain when surgical debridement is performed. If needed, Patient is instructed to use over the counter pain medication for the following 24-48 hours after debridement. Wound care MDs do not prescribed pain medications. Patient has chronic pain or uncontrolled pain. Patient has been instructed to make an appointment with their Primary Care Physician for pain management. Electronic Signature(s) Signed: 08/13/2016 4:19:15 PM By: Montey Hora Entered By: Montey Hora on 08/13/2016 14:34:08 Jeffery Simon ([id number]) -------------------------------------------------------------------------------- Patient/Caregiver Education Details Patient Name: Jeffery Simon. Date of Service: 08/13/2016 2:30 PM Medical Record Number: [id number] Patient Account Number: [id number] Date of Birth/Gender: 08-Nov-1965 (51 y.o. Male) Treating RN: Montey Hora Primary Care Physician: Greig Right Other Clinician: Referring Physician: Greig Right Treating Physician/Extender: Frann Rider in Treatment: 14 Education Assessment Education Provided To: Patient and Caregiver Education Topics Provided Wound/Skin Impairment: Handouts: Other: wound care as ordered Methods: Demonstration, Explain/Verbal Responses: State content correctly Electronic Signature(s) Signed: 08/13/2016 4:19:15 PM By: Montey Hora Entered By: Montey Hora on 08/13/2016  14:48:47 Jeffery Simon ([id number]) -------------------------------------------------------------------------------- Wound Assessment Details Patient Name: Jeffery Simon. Date of Service: 08/13/2016 2:30 PM Medical Record Number: [id number] Patient Account Number: [id number] Date of Birth/Sex: 01/02/1966 (51 y.o. Male) Treating RN: Montey Hora Primary Care Ziasia Lenoir: Greig Right Other Clinician: Referring Arvella Massingale: Greig Right Treating Suly Vukelich/Extender: Frann Rider in Treatment: 14 Wound Status Wound Number: 1 Primary Etiology: Dehisced Wound Wound Location: Left Malleolus - Medial Wound Status: Open Wounding Event: Gradually Appeared Comorbid History: Sleep Apnea, Hypertension Date Acquired: 04/25/2016 Weeks Of Treatment: 14 Clustered Wound: No Photos Photo Uploaded By: Montey Hora on 08/13/2016 16:15:08 Wound Measurements Length: (cm) 0.4 Width: (cm) 0.2 Depth: (cm) 0.1 Area: (cm) 0.063 Volume: (cm) 0.006 % Reduction in Area: 86.6% % Reduction in Volume: 98.9% Epithelialization: Medium (34-66%) Tunneling: No Undermining: No Wound Description Full Thickness Without Exposed Classification: Support Structures Wound Margin: Epibole Exudate Large Amount: Exudate Type: Serous Exudate Color: amber Foul Odor After Cleansing: No Slough/Fibrino Yes Wound Bed Granulation Amount: Large (67-100%) Exposed Structure Granulation Quality: Pink Fascia Exposed: No Necrotic Amount: None Present (0%) Fat Layer (Subcutaneous Tissue) Exposed: No Edmondson, Julion M. ([id number]) Tendon Exposed: No Muscle Exposed: No Joint Exposed: No Bone Exposed: No Periwound Skin Texture Texture Color No Abnormalities Noted: No No Abnormalities Noted: No Callus: No Atrophie Blanche: No Crepitus: No Cyanosis: No Excoriation: No Ecchymosis: Yes Induration: No Erythema: No Rash: No Hemosiderin Staining: No Scarring: No Mottled: No Pallor:  No Moisture Rubor: No No Abnormalities Noted: No Dry / Scaly: No Temperature / Pain Maceration: No Temperature: No Abnormality Wound Preparation Ulcer Cleansing: Rinsed/Irrigated with Saline Topical Anesthetic Applied: Other: lidocaine 4%, Treatment Notes Wound #1 (Left, Medial Malleolus) 1. Cleansed with: Clean wound with Normal Saline 2. Anesthetic Topical Lidocaine 4% cream to wound bed prior to debridement 4. Dressing Applied: Hydrogel Other  dressing (specify in notes) 5. Secondary Dressing Applied Dry Gauze Notes sorbact and coverlet Electronic Signature(s) Signed: 08/13/2016 4:19:15 PM By: Montey Hora Entered By: Montey Hora on 08/13/2016 14:39:01 Jeffery Simon ([id number]) -------------------------------------------------------------------------------- [place] Details Patient Name: Jeffery Simon. Date of Service: 08/13/2016 2:30 PM Medical Record Number: [id number] Patient Account Number: [id number] Date of Birth/Sex: 12-30-1965 (51 y.o. Male) Treating RN: Montey Hora Primary Care Bonney Berres: Greig Right Other Clinician: Referring Amayrany Cafaro: Greig Right Treating Lovie Zarling/Extender: Frann Rider in Treatment: 14 Vital Signs Time Taken: 14:35 Temperature (F): 98.4 Height (in): 71 Pulse (bpm): 75 Weight (lbs): 389 Respiratory Rate (breaths/min): 18 Body Mass Index (BMI): 54.2 Blood Pressure (mmHg): 125/60 Reference Range: 80 - 120 mg / dl Electronic Signature(s) Signed: 08/13/2016 4:19:15 PM By: Montey Hora Entered By: Montey Hora on 08/13/2016 14:35:55

## 2016-08-23 ENCOUNTER — Encounter: Payer: BLUE CROSS/BLUE SHIELD | Admitting: Surgery

## 2016-08-23 DIAGNOSIS — G4733 Obstructive sleep apnea (adult) (pediatric): Secondary | ICD-10-CM | POA: Diagnosis not present

## 2016-08-23 DIAGNOSIS — L97323 Non-pressure chronic ulcer of left ankle with necrosis of muscle: Secondary | ICD-10-CM | POA: Diagnosis not present

## 2016-08-23 DIAGNOSIS — I87332 Chronic venous hypertension (idiopathic) with ulcer and inflammation of left lower extremity: Secondary | ICD-10-CM | POA: Diagnosis not present

## 2016-08-23 DIAGNOSIS — I10 Essential (primary) hypertension: Secondary | ICD-10-CM | POA: Diagnosis not present

## 2016-08-23 DIAGNOSIS — Z6841 Body Mass Index (BMI) 40.0 and over, adult: Secondary | ICD-10-CM | POA: Diagnosis not present

## 2016-08-23 DIAGNOSIS — T8131XD Disruption of external operation (surgical) wound, not elsewhere classified, subsequent encounter: Secondary | ICD-10-CM | POA: Diagnosis not present

## 2016-08-23 DIAGNOSIS — B070 Plantar wart: Secondary | ICD-10-CM | POA: Diagnosis not present

## 2016-08-23 DIAGNOSIS — L97329 Non-pressure chronic ulcer of left ankle with unspecified severity: Secondary | ICD-10-CM | POA: Diagnosis not present

## 2016-08-24 NOTE — Progress Notes (Signed)
ORIEN, MAYHALL ([id number]) Visit Report for 08/23/2016 Arrival Information Details Patient Name: Jeffery Simon, Jeffery Simon. Date of Service: 08/23/2016 3:30 PM Medical Record Number: [id number] Patient Account Number: [id number] Date of Birth/Sex: 11/07/1965 (51 y.o. Male) Treating RN: Montey Hora Primary Care Layza Summa: Greig Right Other Clinician: Referring Wakeelah Solan: Greig Right Treating Saranne Crislip/Extender: Frann Rider in Treatment: 15 Visit Information History Since Last Visit Added or deleted any medications: No Patient Arrived: Ambulatory Any new allergies or adverse reactions: No Arrival Time: 15:19 Had a fall or experienced change in No Accompanied By: self activities of daily living that may affect Transfer Assistance: None risk of falls: Patient Identification Verified: Yes Signs or symptoms of abuse/neglect since last No Secondary Verification Process Yes visito Completed: Hospitalized since last visit: No Patient Requires Transmission-Based No Has Dressing in Place as Prescribed: Yes Precautions: Has Compression in Place as Prescribed: Yes Patient Has Alerts: No Pain Present Now: No Electronic Signature(s) Signed: 08/23/2016 4:55:34 PM By: Montey Hora Entered By: Montey Hora on 08/23/2016 15:19:41 Jeffery Simon ([id number]) -------------------------------------------------------------------------------- Clinic Level of Care Assessment Details Patient Name: Jeffery Simon. Date of Service: 08/23/2016 3:30 PM Medical Record Number: [id number] Patient Account Number: [id number] Date of Birth/Sex: November 17, 1965 (51 y.o. Male) Treating RN: Montey Hora Primary Care Marie Chow: Greig Right Other Clinician: Referring Matej Sappenfield: Greig Right Treating Hindy Perrault/Extender: Frann Rider in Treatment: 15 Clinic Level of Care Assessment Items TOOL 4 Quantity Score []  - Use when only an EandM is performed on FOLLOW-UP visit 0 ASSESSMENTS -  Nursing Assessment / Reassessment X - Reassessment of Co-morbidities (includes updates in patient status) 1 10 X - Reassessment of Adherence to Treatment Plan 1 5 ASSESSMENTS - Wound and Skin Assessment / Reassessment X - Simple Wound Assessment / Reassessment - one wound 1 5 []  - Complex Wound Assessment / Reassessment - multiple wounds 0 []  - Dermatologic / Skin Assessment (not related to wound area) 0 ASSESSMENTS - Focused Assessment []  - Circumferential Edema Measurements - multi extremities 0 []  - Nutritional Assessment / Counseling / Intervention 0 X - Lower Extremity Assessment (monofilament, tuning fork, pulses) 1 5 []  - Peripheral Arterial Disease Assessment (using hand held doppler) 0 ASSESSMENTS - Ostomy and/or Continence Assessment and Care []  - Incontinence Assessment and Management 0 []  - Ostomy Care Assessment and Management (repouching, etc.) 0 PROCESS - Coordination of Care X - Simple Patient / Family Education for ongoing care 1 15 []  - Complex (extensive) Patient / Family Education for ongoing care 0 []  - Staff obtains Programmer, systems, Records, Test Results / Process Orders 0 []  - Staff telephones HHA, Nursing Homes / Clarify orders / etc 0 []  - Routine Transfer to another Facility (non-emergent condition) 0 Jeffery Simon, Jeffery Simon ([id number]) []  - Routine Hospital Admission (non-emergent condition) 0 []  - New Admissions / Biomedical engineer / Ordering NPWT, Apligraf, etc. 0 []  - Emergency Hospital Admission (emergent condition) 0 X - Simple Discharge Coordination 1 10 []  - Complex (extensive) Discharge Coordination 0 PROCESS - Special Needs []  - Pediatric / Minor Patient Management 0 []  - Isolation Patient Management 0 []  - Hearing / Language / Visual special needs 0 []  - Assessment of Community assistance (transportation, D/C planning, etc.) 0 []  - Additional assistance / Altered mentation 0 []  - Support Surface(s) Assessment (bed, cushion, seat, etc.) 0 INTERVENTIONS  - Wound Cleansing / Measurement X - Simple Wound Cleansing - one wound 1 5 []  - Complex Wound Cleansing - multiple wounds 0 X - Wound Imaging (photographs - any  number of wounds) 1 5 []  - Wound Tracing (instead of photographs) 0 X - Simple Wound Measurement - one wound 1 5 []  - Complex Wound Measurement - multiple wounds 0 INTERVENTIONS - Wound Dressings []  - Small Wound Dressing one or multiple wounds 0 []  - Medium Wound Dressing one or multiple wounds 0 []  - Large Wound Dressing one or multiple wounds 0 []  - Application of Medications - topical 0 []  - Application of Medications - injection 0 INTERVENTIONS - Miscellaneous []  - External ear exam 0 Jeffery Simon, Jeffery Simon. ([id number]) []  - Specimen Collection (cultures, biopsies, blood, body fluids, etc.) 0 []  - Specimen(s) / Culture(s) sent or taken to Lab for analysis 0 []  - Patient Transfer (multiple staff / Harrel Lemon Lift / Similar devices) 0 []  - Simple Staple / Suture removal (25 or less) 0 []  - Complex Staple / Suture removal (26 or more) 0 []  - Hypo / Hyperglycemic Management (close monitor of Blood Glucose) 0 []  - Ankle / Brachial Index (ABI) - do not check if billed separately 0 X - Vital Signs 1 5 Has the patient been seen at the hospital within the last three years: Yes Total Score: 70 Level Of Care: New/Established - Level 2 Electronic Signature(s) Signed: 08/23/2016 4:55:34 PM By: Montey Hora Entered By: Montey Hora on 08/23/2016 16:13:42 Jeffery Simon ([id number]) -------------------------------------------------------------------------------- Encounter Discharge Information Details Patient Name: Jeffery Simon. Date of Service: 08/23/2016 3:30 PM Medical Record Number: [id number] Patient Account Number: [id number] Date of Birth/Sex: [birth year] (51 y.o. Male) Treating RN: Montey Hora Primary Care Valary Manahan: Greig Right Other Clinician: Referring Ezra Denne: Greig Right Treating Dyrell Tuccillo/Extender: Frann Rider in Treatment: 15 Encounter Discharge Information Items Discharge Pain Level: 0 Discharge Condition: Stable Ambulatory Status: Ambulatory Discharge Destination: Home Transportation: Private Auto Accompanied By: self Schedule Follow-up Appointment: No Medication Reconciliation completed No and provided to Patient/Care Nyemah Watton: Patient Clinical Summary of Care: Declined Electronic Signature(s) Signed: 08/23/2016 4:14:21 PM By: Montey Hora Previous Signature: 08/23/2016 3:31:32 PM Version By: Ruthine Dose Entered By: Montey Hora on 08/23/2016 16:14:21 Jeffery Simon ([id number]) -------------------------------------------------------------------------------- Lower Extremity Assessment Details Patient Name: Jeffery Simon. Date of Service: 08/23/2016 3:30 PM Medical Record Number: [id number] Patient Account Number: [id number] Date of Birth/Sex: [birth year] (51 y.o. Male) Treating RN: Montey Hora Primary Care Candiss Galeana: Greig Right Other Clinician: Referring Gini Caputo: Greig Right Treating Bayden Gil/Extender: Frann Rider in Treatment: 15 Vascular Assessment Pulses: Dorsalis Pedis Palpable: [Left:Yes] Posterior Tibial Extremity colors, hair growth, and conditions: Extremity Color: [Left:Normal] Hair Growth on Extremity: [Left:Yes] Temperature of Extremity: [Left:Warm] Capillary Refill: [Left:< 3 seconds] Electronic Signature(s) Signed: 08/23/2016 4:55:34 PM By: Montey Hora Entered By: Montey Hora on 08/23/2016 15:28:04 Jeffery Simon ([id number]) -------------------------------------------------------------------------------- Multi Wound Chart Details Patient Name: Jeffery Simon. Date of Service: 08/23/2016 3:30 PM Medical Record Number: [id number] Patient Account Number: [id number] Date of Birth/Sex: 1965-08-22 (51 y.o. Male) Treating RN: Montey Hora Primary Care Arnita Koons: Greig Right Other Clinician: Referring  Kaiser Belluomini: Greig Right Treating Jaxen Samples/Extender: Frann Rider in Treatment: 15 Vital Signs Height(in): 71 Pulse(bpm): 72 Weight(lbs): 389 Blood Pressure 135/68 (mmHg): Body Mass Index(BMI): 54 Temperature(F): 98.2 Respiratory Rate 18 (breaths/min): Photos: [1:No Photos] [N/A:N/A] Wound Location: [1:Left, Medial Malleolus] [N/A:N/A] Wounding Event: [1:Gradually Appeared] [N/A:N/A] Primary Etiology: [1:Dehisced Wound] [N/A:N/A] Date Acquired: [1:04/25/2016] [N/A:N/A] Weeks of Treatment: [1:15] [N/A:N/A] Wound Status: [1:Healed - Epithelialized] [N/A:N/A] Measurements L x W x D 0x0x0 [N/A:N/A] (cm) Area (cm) : [1:0] [N/A:N/A] Volume (cm) : [1:0] [N/A:N/A] % Reduction in Area: [1:100.00%] [N/A:N/A] %  Reduction in Volume: 100.00% [N/A:N/A] Classification: [1:Full Thickness Without Exposed Support Structures] [N/A:N/A] Periwound Skin Texture: No Abnormalities Noted [N/A:N/A] Periwound Skin [1:No Abnormalities Noted] [N/A:N/A] Moisture: Periwound Skin Color: No Abnormalities Noted [N/A:N/A] Tenderness on [1:No] [N/A:N/A] Treatment Notes Electronic Signature(s) Signed: 08/23/2016 3:34:18 PM By: Christin Fudge MD, FACS Entered By: Christin Fudge on 08/23/2016 15:34:18 Jeffery Simon ([id number] Simon ([id number]) -------------------------------------------------------------------------------- [place] Details Patient Name: Jeffery Simon. Date of Service: 08/23/2016 3:30 PM Medical Record Number: [id number] Patient Account Number: [id number] Date of Birth/Sex: 08-Aug-1965 (51 y.o. Male) Treating RN: Montey Hora Primary Care Davied Nocito: Greig Right Other Clinician: Referring Allean Montfort: Greig Right Treating Mayetta Castleman/Extender: Frann Rider in Treatment: 15 Active Inactive Electronic Signature(s) Signed: 08/23/2016 4:11:48 PM By: Montey Hora Entered By: Montey Hora on 08/23/2016 16:11:47 Jeffery Simon  ([id number]) -------------------------------------------------------------------------------- Pain Assessment Details Patient Name: Jeffery Simon. Date of Service: 08/23/2016 3:30 PM Medical Record Number: [id number] Patient Account Number: [id number] Date of Birth/Sex: Jan 09, 1966 (51 y.o. Male) Treating RN: Montey Hora Primary Care Jowan Skillin: Greig Right Other Clinician: Referring Nycere Presley: Greig Right Treating Ejay Lashley/Extender: Frann Rider in Treatment: 15 Active Problems Location of Pain Severity and Description of Pain Patient Has Paino No Site Locations Pain Management and Medication Current Pain Management: Notes Topical or injectable lidocaine is offered to patient for acute pain when surgical debridement is performed. If needed, Patient is instructed to use over the counter pain medication for the following 24-48 hours after debridement. Wound care MDs do not prescribed pain medications. Patient has chronic pain or uncontrolled pain. Patient has been instructed to make an appointment with their Primary Care Physician for pain management. Electronic Signature(s) Signed: 08/23/2016 4:55:34 PM By: Montey Hora Entered By: Montey Hora on 08/23/2016 15:19:50 Jeffery Simon ([id number]) -------------------------------------------------------------------------------- Patient/Caregiver Education Details Patient Name: Jeffery Simon. Date of Service: 08/23/2016 3:30 PM Medical Record Number: [id number] Patient Account Number: [id number] Date of Birth/Gender: April 03, 1965 (51 y.o. Male) Treating RN: Montey Hora Primary Care Physician: Greig Right Other Clinician: Referring Physician: Greig Right Treating Physician/Extender: Frann Rider in Treatment: 15 Education Assessment Education Provided To: Patient Education Topics Provided Venous: Handouts: Other: continue compression daily Methods: Explain/Verbal Responses: State content  correctly Electronic Signature(s) Signed: 08/23/2016 4:55:34 PM By: Montey Hora Entered By: Montey Hora on 08/23/2016 16:14:48 Jeffery Simon ([id number]) -------------------------------------------------------------------------------- Wound Assessment Details Patient Name: Jeffery Simon. Date of Service: 08/23/2016 3:30 PM Medical Record Number: [id number] Patient Account Number: [id number] Date of Birth/Sex: 03/29/1965 (51 y.o. Male) Treating RN: Montey Hora Primary Care Jasiyah Paulding: Greig Right Other Clinician: Referring Pleasant Britz: Greig Right Treating Jamiee Milholland/Extender: Frann Rider in Treatment: 15 Wound Status Wound Number: 1 Primary Etiology: Dehisced Wound Wound Location: Left, Medial Malleolus Wound Status: Healed - Epithelialized Wounding Event: Gradually Appeared Date Acquired: 04/25/2016 Weeks Of Treatment: 15 Clustered Wound: No Wound Measurements Length: (cm) 0 % Reducti Width: (cm) 0 % Reducti Depth: (cm) 0 Area: (cm) 0 Volume: (cm) 0 on in Area: 100% on in Volume: 100% Wound Description Full Thickness Without Exposed Classification: Support Structures Periwound Skin Texture Texture Color No Abnormalities Noted: No No Abnormalities Noted: No Moisture No Abnormalities Noted: No Electronic Signature(s) Signed: 08/23/2016 4:55:34 PM By: Montey Hora Entered By: Montey Hora on 08/23/2016 15:27:45 Jeffery Simon ([id number]) -------------------------------------------------------------------------------- [place] Details Patient Name: Jeffery Simon. Date of Service: 08/23/2016 3:30 PM Medical Record Number: [id number] Patient Account Number: [id number] Date of Birth/Sex: 1965-03-05 (51 y.o. Male) Treating RN: Montey Hora Primary Care Akyah Lagrange: Burnett Sheng,  PAMELA Other Clinician: Referring Kron Everton: Greig Right Treating Ellanor Feuerstein/Extender: Frann Rider in Treatment: 15 Vital Signs Time Taken: 15:20 Temperature  (F): 98.2 Height (in): 71 Pulse (bpm): 72 Weight (lbs): 389 Respiratory Rate (breaths/min): 18 Body Mass Index (BMI): 54.2 Blood Pressure (mmHg): 135/68 Reference Range: 80 - 120 mg / dl Electronic Signature(s) Signed: 08/23/2016 4:55:34 PM By: Montey Hora Entered By: Montey Hora on 08/23/2016 15:20:59

## 2016-08-24 NOTE — Progress Notes (Signed)
Jeffery, Simon ([id number]) Visit Report for 08/23/2016 Chief Complaint Document Details Patient Name: Jeffery Simon, Jeffery Simon. Date of Service: 08/23/2016 3:30 PM Medical Record Number: [id number] Patient Account Number: [id number] Date of Birth/Sex: March 21, 1965 (51 y.o. Male) Treating RN: Montey Hora Primary Care Provider: Greig Right Other Clinician: Referring Provider: Greig Right Treating Provider/Extender: Frann Rider in Treatment: 15 Information Obtained from: Patient Chief Complaint Patient is here for review of a post surgical wound on the left medial ankle Electronic Signature(s) Signed: 08/23/2016 3:34:25 PM By: Christin Fudge MD, FACS Entered By: Christin Fudge on 08/23/2016 15:34:25 Jeffery Simon ([id number]) -------------------------------------------------------------------------------- HPI Details Patient Name: Jeffery Simon. Date of Service: 08/23/2016 3:30 PM Medical Record Number: [id number] Patient Account Number: [id number] Date of Birth/Sex: 04-20-1965 (51 y.o. Male) Treating RN: Montey Hora Primary Care Provider: Greig Right Other Clinician: Referring Provider: Greig Right Treating Provider/Extender: Frann Rider in Treatment: 15 History of Present Illness HPI Description: 05/04/16; this is a 51 year old nondiabetic non-smoking man who is here for review of a wound on the left medial ankle. He has severe hypertension, severe obstructive sleep apnea on CPAP and morbid obesity. Is a problem apparently began in December with small veins that ruptured causing copious amounts of bleeding. He followed with Dr. [name] of [facility] underwent laser ablation in the left leg in January and on the right leg later on. He tolerated both of these procedures well. However he was admitted from 04/24/16 through 04/27/16 with cellulitis involving the left ankle. CT scan showed circumferential severe edema with posterior medial ankle fluid collection. He was  given vancomycin and Rocephin and was seen by Dr. [name] and underwent an IandD of the area. Aspiration of the left ankle was negative. Culture showed MRSA and he was discharged on doxycycline which she is still taking and will remain on total next week. He was discharged with iodoform packing to the surgical wound and a Ace wrap that is being applied by his wife. He was treated with vancomycin in the hospital. The patient does not have a prior history of lower extremity wounds although he has chronic venous insufficiency with inflammation and secondary lymphedema. Echocardiogram done in the hospital showed an EF of 50-55%. Grade 2 diastolic dysfunction ABIs in this clinic were 1.03 on the right 0.9 on the left 05/11/16; patient arrives back in clinic today for review of a surgical area on the left medial ankle. He has completed his antibiotics there is no evidence of surrounding infection and no pain. We started silver alginate last week in place of iodoform packing. 05/18/16; the patient's wound is smaller in terms of the orifice and now as 1.3 cm depth versus 1.7 cm last week. He has completed his antibiotics. His insurance did not approve a Snap Vac. The wound is now too small for a standard [employer] electrical Vac. 05/25/16 continues to improve in term of depth. 0.7x0.3x0.8. Ddp down by 0.5cm. original surgical wound 06/01/16; measuring slightly smaller. 0.7x0.3x0.6. still using collagen 06/08/16; filling in slowly. 0.7x0.3x0.4v still using collagen. Travelling to [place] next week 06/25/2016 -- the patient comes today as he has been traveling a lot and was unable to keep his previous appointments with Dr. Dellia Nims. He has not been wearing his compression stockings as advised and his dressing is not changed regularly as he has had a 4-layer compression applied. 07/05/2016 -- he has been traveling a lot but has been able to do his dual layer compression and he is using Santyl. He has developed a  new  superficial ulceration on the anterior part of his left ankle. 07/30/16 on evaluation today patient's wound appears to be doing well. There is no evidence of infection and he has only minimal discomfort. 08/06/16 patient's left medial ankle wound appears to be doing well on evaluation today. He has been tolerating the dressing without complication currently he is utilizing the Prisma dressing. ARIC, JOST ([id number]) Electronic Signature(s) Signed: 08/23/2016 3:34:30 PM By: Christin Fudge MD, FACS Entered By: Christin Fudge on 08/23/2016 15:34:30 Jeffery Simon ([id number]) -------------------------------------------------------------------------------- Physical Exam Details Patient Name: Jeffery Simon. Date of Service: 08/23/2016 3:30 PM Medical Record Number: [id number] Patient Account Number: [id number] Date of Birth/Sex: [birth year] (51 y.o. Male) Treating RN: Montey Hora Primary Care Provider: Greig Right Other Clinician: Referring Provider: Greig Right Treating Provider/Extender: Frann Rider in Treatment: 15 Constitutional . Pulse regular. Respirations normal and unlabored. Afebrile. . Eyes Nonicteric. Reactive to light. Ears, Nose, Mouth, and Throat Lips, teeth, and gums WNL.Marland Kitchen Moist mucosa without lesions. Neck supple and nontender. No palpable supraclavicular or cervical adenopathy. Normal sized without goiter. Respiratory WNL. No retractions.. Cardiovascular Pedal Pulses WNL. No clubbing, cyanosis or edema. Lymphatic No adneopathy. No adenopathy. No adenopathy. Musculoskeletal Adexa without tenderness or enlargement.. Digits and nails w/o clubbing, cyanosis, infection, petechiae, ischemia, or inflammatory conditions.. Integumentary (Hair, Skin) No suspicious lesions. No crepitus or fluctuance. No peri-wound warmth or erythema. No masses.Marland Kitchen Psychiatric Judgement and insight Intact.. No evidence of depression, anxiety, or agitation.. Notes wound  has healed. Electronic Signature(s) Signed: 08/23/2016 3:34:53 PM By: Christin Fudge MD, FACS Entered By: Christin Fudge on 08/23/2016 15:34:52 Jeffery Simon ([id number]) -------------------------------------------------------------------------------- Physician Orders Details Patient Name: Jeffery Simon. Date of Service: 08/23/2016 3:30 PM Medical Record Number: [id number] Patient Account Number: [id number] Date of Birth/Sex: 05-27-1965 (51 y.o. Male) Treating RN: Montey Hora Primary Care Provider: Greig Right Other Clinician: Referring Provider: Greig Right Treating Provider/Extender: Frann Rider in Treatment: 15 Verbal / Phone Orders: No Diagnosis Coding Discharge From [facility] o Discharge from [place] Signature(s) Signed: 08/23/2016 4:12:07 PM By: Christin Fudge MD, FACS Signed: 08/23/2016 4:55:34 PM By: Montey Hora Entered By: Montey Hora on 08/23/2016 15:28:22 Jeffery Simon ([id number]) -------------------------------------------------------------------------------- Problem List Details Patient Name: MAOR, MECKEL. Date of Service: 08/23/2016 3:30 PM Medical Record Number: [id number] Patient Account Number: [id number] Date of Birth/Sex: 1965-05-22 (51 y.o. 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 Date of Service: 08/23/2016 3:30 PM Medical Record Number: [id number] Patient Account Number: [id number] Date of Birth/Sex: 1965/12/26 (51 y.o. Male) Treating RN: Montey Hora Primary Care Provider: Greig Right Other Clinician: Referring Provider: Greig Right Treating Provider/Extender: Frann Rider in Treatment: 15 Subjective Chief Complaint Information obtained from Patient Patient is here for review of a post surgical wound on the left medial ankle History of Present Illness (HPI) 05/04/16; this is a 51 year old nondiabetic non-smoking man who is  here for review of a wound on the left medial ankle. He has severe hypertension, severe obstructive sleep apnea on CPAP and morbid obesity. Is a problem apparently began in December with small veins that ruptured causing copious amounts of bleeding. He followed with Dr. [name] of [facility] underwent laser ablation in the left leg in January and on the right leg later on. He tolerated both of these procedures well. However he was admitted from 04/24/16 through 04/27/16 with cellulitis involving the left ankle. CT scan showed circumferential severe edema with posterior medial ankle fluid collection. He was given vancomycin and Rocephin and was seen by Dr. [name] and underwent an IandD of the area. Aspiration of the left ankle was negative. Culture showed MRSA and he was discharged on doxycycline which she is still taking and will remain on total next week. He was discharged with iodoform packing to the surgical wound and a Ace wrap that is being applied by his wife. He was treated with vancomycin in the hospital. The patient does not have a prior history of lower extremity wounds although  he has chronic venous insufficiency with inflammation and secondary lymphedema. Echocardiogram done in the hospital showed an EF of 50-55%. Grade 2 diastolic dysfunction ABIs in this clinic were 1.03 on the right 0.9 on the left 05/11/16; patient arrives back in clinic today for review of a surgical area on the left medial ankle. He has completed his antibiotics there is no evidence of surrounding infection and no pain. We started silver alginate last week in place of iodoform packing. 05/18/16; the patient's wound is smaller in terms of the orifice and now as 1.3 cm depth versus 1.7 cm last week. He has completed his antibiotics. His insurance did not approve a Snap Vac. The wound is now too small for a standard [employer] electrical Vac. 05/25/16 continues to improve in term of depth. 0.7x0.3x0.8. Ddp down by 0.5cm. original surgical wound 06/01/16; measuring slightly smaller. 0.7x0.3x0.6. still using collagen 06/08/16; filling in slowly. 0.7x0.3x0.4v still using collagen. Travelling to [place] next week 06/25/2016 -- the patient comes today as he has been traveling a lot and was unable to keep his previous appointments with Dr. Dellia Nims. He has not been wearing his compression stockings as advised and his dressing is not changed regularly as he has had a 4-layer compression applied. 07/05/2016 -- he has been traveling a lot but has been able to do his dual layer compression and he is using Santyl. He has developed a new superficial ulceration on the anterior part of his left ankle. FREDERICH, MONTILLA ([id number]) 07/30/16 on evaluation today patient's wound appears to be doing well. There is no evidence of infection and he has only minimal discomfort. 08/06/16 patient's left medial ankle wound appears to be doing well on evaluation today. He has been tolerating the dressing without complication currently he is utilizing the Prisma dressing. Objective Constitutional Pulse regular. Respirations normal and  unlabored. Afebrile. Vitals Time Taken: 3:20 PM, Height: 71 in, Weight: 389 lbs, BMI: 54.2, Temperature: 98.2 F, Pulse: 72 bpm, Respiratory Rate: 18 breaths/min, Blood Pressure: 135/68 mmHg. Eyes Nonicteric. Reactive to light. Ears, Nose, Mouth, and Throat Lips, teeth, and gums WNL.Marland Kitchen Moist mucosa without lesions. Neck supple and nontender. No palpable supraclavicular or cervical adenopathy. Normal sized without goiter. Respiratory WNL. No retractions.. Cardiovascular Pedal Pulses WNL. No clubbing, cyanosis or edema. Lymphatic No adneopathy. No adenopathy. No adenopathy. Musculoskeletal Adexa without tenderness or enlargement.. Digits and nails w/o clubbing, cyanosis, infection, petechiae, ischemia,  or inflammatory conditions.Marland Kitchen Psychiatric Judgement and insight Intact.. No evidence of depression, anxiety, or agitation.. General Notes: wound has healed. ISIDORE, MARGRAF. ([id number]) Integumentary (Hair, Skin) No suspicious lesions. No crepitus or fluctuance. No peri-wound warmth or erythema. No masses.. Wound #1 status is Healed - Epithelialized. Original cause of wound was Gradually Appeared. The wound is located on the Left,Medial Malleolus. The wound measures 0cm length x 0cm width x 0cm depth; 0cm^2 area and 0cm^3 volume. 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 He will return to the wound clinic only if the need arises. Electronic Signature(s) Signed: 08/23/2016 3:35:45 PM By: Christin Fudge MD, FACS Entered By: Christin Fudge on 08/23/2016 15:35:45 Jeffery Simon ([id number] Simon ([id number]) -------------------------------------------------------------------------------- SuperBill Details Patient Name: Jeffery Simon. Date of Service: 08/23/2016 Medical Record Number: [id number] Patient Account Number: [id number] Date of Birth/Sex: 12-Jan-1966 (51 y.o. 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 Facility Procedures CPT4 Code: [id number] Description: 75300 - WOUND CARE VISIT-LEV 2 EST PT Modifier: Quantity: 1 Physician Procedures CPT4: Description Modifier Quantity Code 5110211 17356 - WC PHYS LEVEL 2 - EST PT 1 ICD-10 Description Diagnosis T81.31XD Disruption of external operation (surgical) wound, not elsewhere classified, subsequent encounter I87.332 Chronic venous  hypertension (idiopathic) with ulcer and inflammation of left lower extremity 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

## 2016-12-24 DIAGNOSIS — I10 Essential (primary) hypertension: Secondary | ICD-10-CM | POA: Diagnosis not present

## 2017-01-21 DIAGNOSIS — I10 Essential (primary) hypertension: Secondary | ICD-10-CM | POA: Diagnosis not present

## 2017-02-08 DIAGNOSIS — M21621 Bunionette of right foot: Secondary | ICD-10-CM | POA: Diagnosis not present

## 2017-02-08 DIAGNOSIS — M79671 Pain in right foot: Secondary | ICD-10-CM | POA: Diagnosis not present

## 2017-02-17 ENCOUNTER — Other Ambulatory Visit: Payer: Self-pay | Admitting: Orthopedic Surgery

## 2017-02-23 NOTE — Progress Notes (Signed)
Chart reviewed with Dr Ermalene Postin, due to pt hx BMI 55, OSA on CPAP, HTN, he will need to be done at main OR. Elmyra Ricks at Dr Jackalyn Lombard office notified.

## 2017-02-28 NOTE — Patient Instructions (Signed)
Jeffery Simon  02/28/2017   Your procedure is scheduled on: 03-02-17  Report to [facility] Main  Entrance  Report to admitting at      0930AM   Call this number if you have problems the morning of surgery [phone]    Remember: Do not eat food or drink liquids :After Midnight.     Take these medicines the morning of surgery with A SIP OF WATER: none                                You may not have any metal on your body including hair pins and              piercings  Do not wear jewelry,lotions, powders or perfumes, deodorant                    Men may shave face and neck.   Do not bring valuables to the hospital. [place].  Contacts, dentures or bridgework may not be worn into surgery.      Patients discharged the day of surgery will not be allowed to drive home.  Name and phone number of your driver:  Special Instructions: N/A              Please read over the following fact sheets you were given: _____________________________________________________________________           [facility] - Preparing for Surgery Before surgery, you can play an important role.  Because skin is not sterile, your skin needs to be as free of germs as possible.  You can reduce the number of germs on your skin by washing with CHG (chlorahexidine gluconate) soap before surgery.  CHG is an antiseptic cleaner which kills germs and bonds with the skin to continue killing germs even after washing. Please DO NOT use if you have an allergy to CHG or antibacterial soaps.  If your skin becomes reddened/irritated stop using the CHG and inform your nurse when you arrive at Short Stay. Do not shave (including legs and underarms) for at least 48 hours prior to the first CHG shower.  You may shave your face/neck. Please follow these instructions carefully:  1.  Shower with CHG Soap the night before surgery and the  morning of  Surgery.  2.  If you choose to wash your hair, wash your hair first as usual with your  normal  shampoo.  3.  After you shampoo, rinse your hair and body thoroughly to remove the  shampoo.                           4.  Use CHG as you would any other liquid soap.  You can apply chg directly  to the skin and wash                       Gently with a scrungie or clean washcloth.  5.  Apply the CHG Soap to your body ONLY FROM THE NECK DOWN.   Do not use on face/ open  Wound or open sores. Avoid contact with eyes, ears mouth and genitals (private parts).                       Wash face,  Genitals (private parts) with your normal soap.             6.  Wash thoroughly, paying special attention to the area where your surgery  will be performed.  7.  Thoroughly rinse your body with warm water from the neck down.  8.  DO NOT shower/wash with your normal soap after using and rinsing off  the CHG Soap.                9.  Pat yourself dry with a clean towel.            10.  Wear clean pajamas.            11.  Place clean sheets on your bed the night of your first shower and do not  sleep with pets. Day of Surgery : Do not apply any lotions/deodorants the morning of surgery.  Please wear clean clothes to the hospital/surgery center.  FAILURE TO FOLLOW THESE INSTRUCTIONS MAY RESULT IN THE CANCELLATION OF YOUR SURGERY PATIENT SIGNATURE_________________________________  NURSE SIGNATURE__________________________________  ________________________________________________________________________   Jeffery Simon  An incentive spirometer is a tool that can help keep your lungs clear and active. This tool measures how well you are filling your lungs with each breath. Taking long deep breaths may help reverse or decrease the chance of developing breathing (pulmonary) problems (especially infection) following:  A long period of time when you are unable to move or be active. BEFORE  THE PROCEDURE   If the spirometer includes an indicator to show your best effort, your nurse or respiratory therapist will set it to a desired goal.  If possible, sit up straight or lean slightly forward. Try not to slouch.  Hold the incentive spirometer in an upright position. INSTRUCTIONS FOR USE  1. Sit on the edge of your bed if possible, or sit up as far as you can in bed or on a chair. 2. Hold the incentive spirometer in an upright position. 3. Breathe out normally. 4. Place the mouthpiece in your mouth and seal your lips tightly around it. 5. Breathe in slowly and as deeply as possible, raising the piston or the ball toward the top of the column. 6. Hold your breath for 3-5 seconds or for as long as possible. Allow the piston or ball to fall to the bottom of the column. 7. Remove the mouthpiece from your mouth and breathe out normally. 8. Rest for a few seconds and repeat Steps 1 through 7 at least 10 times every 1-2 hours when you are awake. Take your time and take a few normal breaths between deep breaths. 9. The spirometer may include an indicator to show your best effort. Use the indicator as a goal to work toward during each repetition. 10. After each set of 10 deep breaths, practice coughing to be sure your lungs are clear. If you have an incision (the cut made at the time of surgery), support your incision when coughing by placing a pillow or rolled up towels firmly against it. Once you are able to get out of bed, walk around indoors and cough well. You may stop using the incentive spirometer when instructed by your caregiver.  RISKS AND COMPLICATIONS  Take your time so you do not get  dizzy or light-headed.  If you are in pain, you may need to take or ask for pain medication before doing incentive spirometry. It is harder to take a deep breath if you are having pain. AFTER USE  Rest and breathe slowly and easily.  It can be helpful to keep track of a log of your progress.  Your caregiver can provide you with a simple table to help with this. If you are using the spirometer at home, follow these instructions: [place] IF:   You are having difficultly using the spirometer.  You have trouble using the spirometer as often as instructed.  Your pain medication is not giving enough relief while using the spirometer.  You develop fever of 100.5 F (38.1 C) or higher. SEEK IMMEDIATE MEDICAL CARE IF:   You cough up bloody sputum that had not been present before.  You develop fever of 102 F (38.9 C) or greater.  You develop worsening pain at or near the incision site. MAKE SURE YOU:   Understand these instructions.  Will watch your condition.  Will get help right away if you are not doing well or get worse. Document Released: 05/24/2006 Document Revised: 04/05/2011 Document Reviewed: 07/25/2006 [facility] Patient Information 2014 [place], [place].   ________________________________________________________________________

## 2017-03-01 ENCOUNTER — Encounter (HOSPITAL_COMMUNITY): Payer: Self-pay

## 2017-03-01 ENCOUNTER — Other Ambulatory Visit: Payer: Self-pay

## 2017-03-01 ENCOUNTER — Encounter (HOSPITAL_COMMUNITY)
Admission: RE | Admit: 2017-03-01 | Discharge: 2017-03-01 | Disposition: A | Discharge status: Home / Self Care | Payer: BLUE CROSS/BLUE SHIELD | Source: Ambulatory Visit | Attending: Orthopedic Surgery | Admitting: Orthopedic Surgery

## 2017-03-01 DIAGNOSIS — Z9989 Dependence on other enabling machines and devices: Secondary | ICD-10-CM | POA: Diagnosis not present

## 2017-03-01 DIAGNOSIS — G4730 Sleep apnea, unspecified: Secondary | ICD-10-CM | POA: Diagnosis not present

## 2017-03-01 DIAGNOSIS — Z79899 Other long term (current) drug therapy: Secondary | ICD-10-CM | POA: Diagnosis not present

## 2017-03-01 DIAGNOSIS — I10 Essential (primary) hypertension: Secondary | ICD-10-CM | POA: Diagnosis not present

## 2017-03-01 DIAGNOSIS — Z791 Long term (current) use of non-steroidal anti-inflammatories (NSAID): Secondary | ICD-10-CM | POA: Diagnosis not present

## 2017-03-01 DIAGNOSIS — I739 Peripheral vascular disease, unspecified: Secondary | ICD-10-CM | POA: Diagnosis not present

## 2017-03-01 DIAGNOSIS — M79671 Pain in right foot: Secondary | ICD-10-CM | POA: Diagnosis present

## 2017-03-01 DIAGNOSIS — M21621 Bunionette of right foot: Secondary | ICD-10-CM | POA: Diagnosis not present

## 2017-03-01 HISTORY — DX: Unspecified osteoarthritis, unspecified site: M19.90

## 2017-03-01 LAB — BASIC METABOLIC PANEL
Anion gap: 7 (ref 5–15)
BUN: 13 mg/dL (ref 6–20)
CHLORIDE: 103 mmol/L (ref 101–111)
CO2: 30 mmol/L (ref 22–32)
Calcium: 8.7 mg/dL — ABNORMAL LOW (ref 8.9–10.3)
Creatinine, Ser: 0.78 mg/dL (ref 0.61–1.24)
Glucose, Bld: 100 mg/dL — ABNORMAL HIGH (ref 65–99)
POTASSIUM: 3.9 mmol/L (ref 3.5–5.1)
SODIUM: 140 mmol/L (ref 135–145)

## 2017-03-01 LAB — CBC
HCT: 37.3 % — ABNORMAL LOW (ref 39.0–52.0)
Hemoglobin: 12.3 g/dL — ABNORMAL LOW (ref 13.0–17.0)
MCH: 30.4 pg (ref 26.0–34.0)
MCHC: 33 g/dL (ref 30.0–36.0)
MCV: 92.3 fL (ref 78.0–100.0)
Platelets: 195 10*3/uL (ref 150–400)
RBC: 4.04 MIL/uL — AB (ref 4.22–5.81)
RDW: 13.3 % (ref 11.5–15.5)
WBC: 8.9 10*3/uL (ref 4.0–10.5)

## 2017-03-01 LAB — SURGICAL PCR SCREEN
MRSA, PCR: POSITIVE — AB
STAPHYLOCOCCUS AUREUS: POSITIVE — AB

## 2017-03-01 MED ORDER — DEXTROSE 5 % IV SOLN
3.0000 g | INTRAVENOUS | Status: AC
  Administered 2017-03-02: 3 g via INTRAVENOUS
  Filled 2017-03-01: qty 3000
  Filled 2017-03-01: qty 3

## 2017-03-01 NOTE — Progress Notes (Addendum)
FYI Pt. In for preop apt. For surgery tomorrow at [facility]. BP 187/105. Pt. Denies CP or any other symptoms . EKG done NSR. Pt. Stated he took his BP pill at 0730 this am.

## 2017-03-01 NOTE — Progress Notes (Signed)
[facility] bed requested for 03-02-17 for 930 am.

## 2017-03-01 NOTE — Progress Notes (Signed)
EKG 04-25-16 Epic  ECHO 04-27-16 EPIC

## 2017-03-02 ENCOUNTER — Ambulatory Visit (HOSPITAL_COMMUNITY): Payer: BLUE CROSS/BLUE SHIELD | Admitting: Certified Registered Nurse Anesthetist

## 2017-03-02 ENCOUNTER — Encounter (HOSPITAL_COMMUNITY): Payer: Self-pay | Admitting: *Deleted

## 2017-03-02 ENCOUNTER — Ambulatory Visit (HOSPITAL_COMMUNITY)
Admission: RE | Admit: 2017-03-02 | Discharge: 2017-03-02 | Disposition: A | Discharge status: Home / Self Care | Payer: BLUE CROSS/BLUE SHIELD | Source: Ambulatory Visit | Attending: Orthopedic Surgery | Admitting: Orthopedic Surgery

## 2017-03-02 ENCOUNTER — Encounter (HOSPITAL_COMMUNITY)
Admission: RE | Disposition: A | Discharge status: Home / Self Care | Payer: Self-pay | Source: Ambulatory Visit | Attending: Orthopedic Surgery

## 2017-03-02 DIAGNOSIS — M21621 Bunionette of right foot: Secondary | ICD-10-CM | POA: Diagnosis present

## 2017-03-02 DIAGNOSIS — Z791 Long term (current) use of non-steroidal anti-inflammatories (NSAID): Secondary | ICD-10-CM | POA: Insufficient documentation

## 2017-03-02 DIAGNOSIS — I10 Essential (primary) hypertension: Secondary | ICD-10-CM | POA: Insufficient documentation

## 2017-03-02 DIAGNOSIS — G4730 Sleep apnea, unspecified: Secondary | ICD-10-CM | POA: Diagnosis not present

## 2017-03-02 DIAGNOSIS — I739 Peripheral vascular disease, unspecified: Secondary | ICD-10-CM | POA: Diagnosis not present

## 2017-03-02 DIAGNOSIS — Z79899 Other long term (current) drug therapy: Secondary | ICD-10-CM | POA: Insufficient documentation

## 2017-03-02 DIAGNOSIS — Z9989 Dependence on other enabling machines and devices: Secondary | ICD-10-CM | POA: Insufficient documentation

## 2017-03-02 DIAGNOSIS — M898X7 Other specified disorders of bone, ankle and foot: Secondary | ICD-10-CM | POA: Diagnosis not present

## 2017-03-02 HISTORY — PX: BUNIONECTOMY: SHX129

## 2017-03-02 HISTORY — DX: Bunionette of right foot: M21.621

## 2017-03-02 SURGERY — BUNIONECTOMY
Anesthesia: General | Laterality: Right

## 2017-03-02 MED ORDER — CHLORHEXIDINE GLUCONATE 4 % EX LIQD: 60.0000 mL | Freq: Once | CUTANEOUS | Status: DC

## 2017-03-02 MED ORDER — DEXAMETHASONE SODIUM PHOSPHATE 4 MG/ML IJ SOLN
INTRAMUSCULAR | Status: DC | PRN
  Administered 2017-03-02: 10 mg via INTRAVENOUS

## 2017-03-02 MED ORDER — LACTATED RINGERS IV SOLN
INTRAVENOUS | Status: DC
  Administered 2017-03-02: 11:00:00 via INTRAVENOUS

## 2017-03-02 MED ORDER — FENTANYL CITRATE (PF) 100 MCG/2ML IJ SOLN
25.0000 ug | INTRAMUSCULAR | Status: DC | PRN
  Administered 2017-03-02 (×2): 50 ug via INTRAVENOUS

## 2017-03-02 MED ORDER — MEPERIDINE HCL 50 MG/ML IJ SOLN: 6.2500 mg | INTRAMUSCULAR | Status: DC | PRN

## 2017-03-02 MED ORDER — ONDANSETRON HCL 4 MG/2ML IJ SOLN
INTRAMUSCULAR | Status: AC
  Filled 2017-03-02: qty 2

## 2017-03-02 MED ORDER — LACTATED RINGERS IV SOLN: INTRAVENOUS | Status: DC

## 2017-03-02 MED ORDER — MIDAZOLAM HCL 2 MG/2ML IJ SOLN
INTRAMUSCULAR | Status: AC
  Filled 2017-03-02: qty 2

## 2017-03-02 MED ORDER — 0.9 % SODIUM CHLORIDE (POUR BTL) OPTIME
TOPICAL | Status: DC | PRN
  Administered 2017-03-02: 1000 mL

## 2017-03-02 MED ORDER — ONDANSETRON HCL 4 MG/2ML IJ SOLN
INTRAMUSCULAR | Status: DC | PRN
  Administered 2017-03-02: 4 mg via INTRAVENOUS

## 2017-03-02 MED ORDER — DEXAMETHASONE SODIUM PHOSPHATE 10 MG/ML IJ SOLN
INTRAMUSCULAR | Status: AC
  Filled 2017-03-02: qty 1

## 2017-03-02 MED ORDER — LIDOCAINE 2% (20 MG/ML) 5 ML SYRINGE
INTRAMUSCULAR | Status: DC | PRN
  Administered 2017-03-02: 80 mg via INTRAVENOUS

## 2017-03-02 MED ORDER — PROMETHAZINE HCL 25 MG/ML IJ SOLN: 6.2500 mg | INTRAMUSCULAR | Status: DC | PRN

## 2017-03-02 MED ORDER — OXYCODONE-ACETAMINOPHEN 5-325 MG PO TABS: 1.0000 | ORAL_TABLET | Freq: Four times a day (QID) | ORAL | 0 refills | Status: DC | PRN

## 2017-03-02 MED ORDER — SUGAMMADEX SODIUM 200 MG/2ML IV SOLN
INTRAVENOUS | Status: AC
  Filled 2017-03-02: qty 2

## 2017-03-02 MED ORDER — FENTANYL CITRATE (PF) 100 MCG/2ML IJ SOLN
INTRAMUSCULAR | Status: AC
  Filled 2017-03-02: qty 2

## 2017-03-02 MED ORDER — PROPOFOL 10 MG/ML IV BOLUS
INTRAVENOUS | Status: DC | PRN
  Administered 2017-03-02: 300 mg via INTRAVENOUS

## 2017-03-02 MED ORDER — LIDOCAINE 2% (20 MG/ML) 5 ML SYRINGE
INTRAMUSCULAR | Status: AC
  Filled 2017-03-02: qty 5

## 2017-03-02 MED ORDER — BUPIVACAINE HCL (PF) 0.5 % IJ SOLN
INTRAMUSCULAR | Status: DC | PRN
  Administered 2017-03-02: 10 mL

## 2017-03-02 MED ORDER — BUPIVACAINE HCL (PF) 0.5 % IJ SOLN
INTRAMUSCULAR | Status: AC
  Filled 2017-03-02: qty 30

## 2017-03-02 MED ORDER — FENTANYL CITRATE (PF) 100 MCG/2ML IJ SOLN
INTRAMUSCULAR | Status: DC | PRN
  Administered 2017-03-02 (×2): 25 ug via INTRAVENOUS
  Administered 2017-03-02: 50 ug via INTRAVENOUS

## 2017-03-02 MED ORDER — DOXYCYCLINE HYCLATE 100 MG PO TABS: 100.0000 mg | ORAL_TABLET | Freq: Two times a day (BID) | ORAL | 0 refills | Status: DC

## 2017-03-02 MED ORDER — POVIDONE-IODINE 10 % OINT PACKET: TOPICAL_OINTMENT | Freq: Once | CUTANEOUS | Status: DC

## 2017-03-02 MED ORDER — PROPOFOL 10 MG/ML IV BOLUS
INTRAVENOUS | Status: AC
  Filled 2017-03-02: qty 40

## 2017-03-02 MED ORDER — MIDAZOLAM HCL 2 MG/2ML IJ SOLN
INTRAMUSCULAR | Status: DC | PRN
  Administered 2017-03-02 (×2): 1 mg via INTRAVENOUS

## 2017-03-02 SURGICAL SUPPLY — 50 items
BAG SPEC THK2 15X12 ZIP CLS (MISCELLANEOUS)
BAG ZIPLOCK 12X15 (MISCELLANEOUS) ×1 IMPLANT
BALL CTTN STRL GZE (GAUZE/BANDAGES/DRESSINGS)
BANDAGE ACE 3X5.8 VEL STRL LF (GAUZE/BANDAGES/DRESSINGS) ×1 IMPLANT
BLADE OSCILLATING/SAGITTAL (BLADE) ×2
BLADE SURG 15 STRL LF DISP TIS (BLADE) ×2 IMPLANT
BLADE SURG 15 STRL SS (BLADE) ×4
BLADE SURG SZ10 CARB STEEL (BLADE) ×2 IMPLANT
BLADE SW THK.38XMED LNG THN (BLADE) ×1 IMPLANT
BNDG COHESIVE 3X5 TAN STRL LF (GAUZE/BANDAGES/DRESSINGS) ×1 IMPLANT
BNDG CONFORM 3 STRL LF (GAUZE/BANDAGES/DRESSINGS) ×1 IMPLANT
BNDG GAUZE ELAST 4 BULKY (GAUZE/BANDAGES/DRESSINGS) ×1 IMPLANT
COTTON BALL STERILE (GAUZE/BANDAGES/DRESSINGS)
COTTON BALL STERILE 2 PK (GAUZE/BANDAGES/DRESSINGS) ×1 IMPLANT
COVER SURGICAL LIGHT HANDLE (MISCELLANEOUS) ×2 IMPLANT
CUFF TOURN SGL QUICK 34 (TOURNIQUET CUFF) ×2
CUFF TRNQT CYL 34X4X40X1 (TOURNIQUET CUFF) ×1 IMPLANT
DRSG EMULSION OIL 3X3 NADH (GAUZE/BANDAGES/DRESSINGS) ×2 IMPLANT
DURAPREP 26ML APPLICATOR (WOUND CARE) ×2 IMPLANT
ELECT REM PT RETURN 15FT ADLT (MISCELLANEOUS) ×2 IMPLANT
GAUZE SPONGE 2X2 8PLY STRL LF (GAUZE/BANDAGES/DRESSINGS) ×1 IMPLANT
GAUZE SPONGE 4X4 12PLY STRL (GAUZE/BANDAGES/DRESSINGS) ×2 IMPLANT
GAUZE SPONGE 4X4 16PLY XRAY LF (GAUZE/BANDAGES/DRESSINGS) ×2 IMPLANT
GLOVE BIOGEL PI IND STRL 8.5 (GLOVE) ×1 IMPLANT
GLOVE BIOGEL PI INDICATOR 8.5 (GLOVE) ×1
GLOVE ECLIPSE 8.0 STRL XLNG CF (GLOVE) ×4 IMPLANT
GOWN STRL REUS W/TWL LRG LVL3 (GOWN DISPOSABLE) ×4 IMPLANT
GOWN STRL REUS W/TWL XL LVL3 (GOWN DISPOSABLE) ×3 IMPLANT
K-WIRE CAPS BLUE STERILE .035 (WIRE)
K-WIRE CAPS STERILE WHITE .045 (WIRE) IMPLANT
K-WIRE CAPS STERILE YELLOW .02 (WIRE)
K-WIRE SMTH SNGL TROCAR .028X4 (WIRE)
KIT BASIN OR (CUSTOM PROCEDURE TRAY) ×2 IMPLANT
KWIRE 4.0 X .045IN (WIRE) IMPLANT
KWIRE 4.0 X .062IN (WIRE) IMPLANT
KWIRE CAPS BLUE STRL .035 (WIRE) IMPLANT
KWIRE CAPS STRL YELLOW .02 (WIRE) IMPLANT
KWIRE SMTH SNGL TROCAR .028X4 (WIRE) IMPLANT
MANIFOLD NEPTUNE II (INSTRUMENTS) ×2 IMPLANT
NS IRRIG 1000ML POUR BTL (IV SOLUTION) ×2 IMPLANT
PACK ORTHO EXTREMITY (CUSTOM PROCEDURE TRAY) ×2 IMPLANT
PAD CAST 4YDX4 CTTN HI CHSV (CAST SUPPLIES) ×1 IMPLANT
PADDING CAST COTTON 4X4 STRL (CAST SUPPLIES)
PIN CAPS ORTHO GREEN .062 (PIN) IMPLANT
POSITIONER SURGICAL ARM (MISCELLANEOUS) ×2 IMPLANT
SPONGE GAUZE 2X2 STER 10/PKG (GAUZE/BANDAGES/DRESSINGS)
SUT ETHILON 4 0 PS 2 18 (SUTURE) ×2 IMPLANT
SUT VICRYL 0 27 CT2 27 ABS (SUTURE) ×4 IMPLANT
TOWEL OR 17X26 10 PK STRL BLUE (TOWEL DISPOSABLE) ×4 IMPLANT
WATER STERILE IRR 1000ML POUR (IV SOLUTION) ×2 IMPLANT

## 2017-03-02 NOTE — Discharge Instructions (Signed)
Bunionectomy, Care After Refer to this sheet in the next few weeks. These instructions provide you with information about caring for yourself after your procedure. Your health care provider may also give you more specific instructions. Your treatment has been planned according to current medical practices, but problems sometimes occur. Call your health care provider if you have any problems or questions after your procedure. What can I expect after the procedure? After your procedure, it is typical to have the following:  Pain.  Swelling.  Follow these instructions at home:  Take medicines only as directed by your health care provider.  Apply ice to the affected area: ? Put ice in a plastic bag. ? Place a towel between your skin and the bag. ? Leave the ice on for 20 minutes, 2-3 times a day.  There are many different ways to close and cover an incision, including stitches (sutures), skin glue, and adhesive strips. Follow your health care provider's instructions about: ? Incision care. ? Bandage (dressing) changes and removal. ? Incision closure removal.  Keep your foot raised (elevated) while you are resting.  You may need to wear a brace or a boot that keeps your foot in the proper position. Wear the boot or brace as directed by your health care provider.  Use crutches, canes, or walkers as directed by your health care provider.  Do not put weight on your foot until your health care provider approves.  Rest as needed. Follow your health care providers instructions on when you can return to your usual activities, like walking and driving.  Do not wear high heels or tight-fitting shoes.  Do physical therapy exercises to strengthen your foot as directed by your health care provider. Contact a health care provider if:  You have increased bleeding from the incision.  You have drainage, redness, swelling, or pain at your incision site.  You have a fever or chills.  You  notice a bad smell coming from the incision or the dressing.  Your dressing gets wet or falls off.  Your calf begins to swell.  Your toes feel numb or stiff. Get help right away if:  You have a rash.  You have difficulty breathing. This information is not intended to replace advice given to you by your health care provider. Make sure you discuss any questions you have with your health care provider. Document Released: 07/31/2004 Document Revised: 06/19/2015 Document Reviewed: 08/29/2013 Elsevier Interactive Patient Education  2018 [place] is to use crutches for ambulation ( has at home)  Pt is to use post op shoe   Post Anesthesia Home Care Instructions  Activity: Get plenty of rest for the remainder of the day. A responsible individual must stay with you for 24 hours following the procedure.  For the next 24 hours, DO NOT: -Drive a car -Paediatric nurse -Drink alcoholic beverages -Take any medication unless instructed by your physician -Make any legal decisions or sign important papers.  Meals: Start with liquid foods such as gelatin or soup. Progress to regular foods as tolerated. Avoid greasy, spicy, heavy foods. If nausea and/or vomiting occur, drink only clear liquids until the nausea and/or vomiting subsides. Call your physician if vomiting continues.  Special Instructions/Symptoms: Your throat may feel dry or sore from the anesthesia or the breathing tube placed in your throat during surgery. If this causes discomfort, gargle with warm salt water. The discomfort should disappear within 24 hours.  If you had a scopolamine patch placed behind your ear  for the management of post- operative nausea and/or vomiting:  1. The medication in the patch is effective for 72 hours, after which it should be removed.  Wrap patch in a tissue and discard in the trash. Wash hands thoroughly with soap and water. 2. You may remove the patch earlier than 72 hours if you experience  unpleasant side effects which may include dry mouth, dizziness or visual disturbances. 3. Avoid touching the patch. Wash your hands with soap and water after contact with the patch.

## 2017-03-02 NOTE — Transfer of Care (Signed)
Immediate Anesthesia Transfer of Care Note  Patient: CECIL VANDYKE  Procedure(s) Performed: RIGHT 5TH BUNIONETTE EXCISION (Right )  Patient Location: PACU  Anesthesia Type:General  Level of Consciousness: awake, alert , oriented and patient cooperative  Airway & Oxygen Therapy: Patient Spontanous Breathing and Patient connected to face mask  Post-op Assessment: Report given to RN and Post -op Vital signs reviewed and stable  Post vital signs: Reviewed and stable  Last Vitals:  Vitals:   03/02/17 0947 03/02/17 0950  BP:  (!) 182/106  Pulse: 80   Resp: 18   Temp: 36.9 C   SpO2: 95%     Last Pain:  Vitals:   03/02/17 0947  TempSrc: Oral         Complications: No apparent anesthesia complications

## 2017-03-02 NOTE — Anesthesia Preprocedure Evaluation (Addendum)
Anesthesia Evaluation  Patient identified by MRN, date of birth, ID band Patient awake    Reviewed: Allergy & Precautions, NPO status , Patient's Chart, lab work & pertinent test results  Airway Mallampati: III  TM Distance: >3 FB Neck ROM: Full    Dental  (+) Teeth Intact, Dental Advisory Given   Pulmonary sleep apnea and Continuous Positive Airway Pressure Ventilation ,    breath sounds clear to auscultation       Cardiovascular hypertension, Pt. on medications + Peripheral Vascular Disease   Rhythm:Regular Rate:Normal     Neuro/Psych    GI/Hepatic negative GI ROS, Neg liver ROS,   Endo/Other  negative endocrine ROS  Renal/GU negative Renal ROS     Musculoskeletal  (+) Arthritis , Osteoarthritis,    Abdominal (+) + obese,   Peds  Hematology   Anesthesia Other Findings   Reproductive/Obstetrics                            Lab Results  Component Value Date   WBC 8.9 03/01/2017   HGB 12.3 (L) 03/01/2017   HCT 37.3 (L) 03/01/2017   MCV 92.3 03/01/2017   PLT 195 03/01/2017   Lab Results  Component Value Date   CREATININE 0.78 03/01/2017   BUN 13 03/01/2017   NA 140 03/01/2017   K 3.9 03/01/2017   CL 103 03/01/2017   CO2 30 03/01/2017   No results found for: INR, PROTIME  EKG: normal sinus rhythm.  Echo: - Left ventricle: The cavity size was at the upper limits of   normal. Wall thickness was normal. The estimated ejection   fraction was in the range of 50% to 55%. Although no diagnostic   regional wall motion abnormality was identified, this possibility   cannot be completely excluded on the basis of this study.   Features are consistent with a pseudonormal left ventricular   filling pattern, with concomitant abnormal relaxation and   increased filling pressure (grade 2 diastolic dysfunction). - Left atrium: The atrium was mildly dilated. - Right ventricle: The cavity size  was mildly dilated. - Pulmonary arteries: Systolic pressure could not be accurately   estimated due to absence of TR jet.  Anesthesia Physical Anesthesia Plan  ASA: IV  Anesthesia Plan: General   Post-op Pain Management:    Induction: Intravenous  PONV Risk Score and Plan: 3 and Ondansetron, Dexamethasone and Midazolam  Airway Management Planned: LMA  Additional Equipment: None  Intra-op Plan:   Post-operative Plan: Extubation in OR  Informed Consent:   Plan Discussed with:   Anesthesia Plan Comments:         Anesthesia Quick Evaluation

## 2017-03-02 NOTE — H&P (Signed)
PREOPERATIVE H&P  Chief Complaint: r foot pain  HPI: Jeffery Simon is a 52 y.o. male who presents for evaluation of r foot pain over the 5th metatarsal head. It has been present for several months and has been worsening. He has failed conservative measures. Pain is rated as moderate.  Past Medical History:  Diagnosis Date  . Arthritis    feet and hands  . HTN (hypertension)   . Obesity   . Sleep apnea    cpap   Past Surgical History:  Procedure Laterality Date  . BUNIONECTOMY     03-02-17 Dr. Berenice Primas  . ENDOVENOUS ABLATION SAPHENOUS VEIN W/ LASER Right 04/06/2016   endovenous laser ablation right greater saphenous vein by Tinnie Gens MD   . I&D EXTREMITY Left 04/25/2016   Procedure: IRRIGATION AND DEBRIDEMENT EXTREMITY;  Surgeon: Rod Can, MD;  Location: [place];  Service: Orthopedics;  Laterality: Left;   Social History   Socioeconomic History  . Marital status: Married    Spouse name: None  . Number of children: None  . Years of education: None  . Highest education level: None  Social Needs  . Financial resource strain: None  . Food insecurity - worry: None  . Food insecurity - inability: None  . Transportation needs - medical: None  . Transportation needs - non-medical: None  Occupational History  . Occupation: Metallurgist  Tobacco Use  . Smoking status: Never Smoker  . Smokeless tobacco: Never Used  . Tobacco comment: cigars occ  Substance and Sexual Activity  . Alcohol use: No    Frequency: Never  . Drug use: No  . Sexual activity: Yes  Other Topics Concern  . None  Social History Narrative  . None   Family History  Problem Relation Age of Onset  . Hypertension Mother   . Hypertension Father    No Known Allergies Prior to Admission medications   Medication Sig Start Date End Date Taking? Authorizing Provider  diclofenac (VOLTAREN) 75 MG EC tablet Take 150 mg by mouth daily.    Yes [provider]  fish oil-omega-3 fatty acids 1000  MG capsule Take 1 g by mouth daily.   Yes [provider]  GARLIC PO Take 1 tablet by mouth daily.   Yes [provider]  Glucosamine-Chondroitin (GLUCOSAMINE CHONDR COMPLEX PO) Take 1 tablet by mouth daily.   Yes [provider]  ibuprofen (ADVIL,MOTRIN) 200 MG tablet Take 400 mg by mouth every 6 (six) hours as needed for headache or moderate pain.   Yes [provider]  losartan-hydrochlorothiazide (HYZAAR) 100-25 MG tablet Take 1 tablet by mouth daily.   Yes [provider]  Multiple Vitamins-Minerals (MULTIVITAMIN WITH MINERALS) tablet Take 1 tablet by mouth daily.   Yes [provider]     Positive ROS: none  All other systems have been reviewed and were otherwise negative with the exception of those mentioned in the HPI and as above.  Physical Exam: Vitals:   03/02/17 0947 03/02/17 0950  BP:  (!) 182/106  Pulse: 80   Resp: 18   Temp: 98.5 F (36.9 C)   SpO2: 95%     General: Alert, no acute distress Cardiovascular: No pedal edema Respiratory: No cyanosis, no use of accessory musculature GI: No organomegaly, abdomen is soft and non-tender Skin: No lesions in the area of chief complaint Neurologic: Sensation intact distally Psychiatric: Patient is competent for consent with normal mood and affect Lymphatic: No axillary or cervical lymphadenopathy  MUSCULOSKELETAL: r foot pain through rom and ttp over 5th head  Xray shows bunionette with some cystic change in the head.  Assessment/Plan: RIGHT FOOT PAINFUL 5TH BUNIONETTE Plan for Procedure(s): RIGHT 5TH BUNIONETTE EXCISION  The risks benefits and alternatives were discussed with the patient including but not limited to the risks of nonoperative treatment, versus surgical intervention including infection, bleeding, nerve injury, malunion, nonunion, hardware prominence, hardware failure, need for hardware removal, blood clots, cardiopulmonary complications, morbidity,  mortality, among others, and they were willing to proceed.  Predicted outcome is good, although there will be at least a six to nine month expected recovery.  Alta Corning, MD 03/02/2017 10:48 AM

## 2017-03-02 NOTE — Anesthesia Postprocedure Evaluation (Signed)
Anesthesia Post Note  Patient: Jeffery Simon  Procedure(s) Performed: RIGHT 5TH BUNIONETTE EXCISION (Right )     Patient location during evaluation: PACU Anesthesia Type: General Level of consciousness: awake Pain management: pain level controlled Vital Signs Assessment: post-procedure vital signs reviewed and stable Respiratory status: spontaneous breathing Cardiovascular status: stable Anesthetic complications: no    Last Vitals:  Vitals:   03/02/17 1410 03/02/17 1439  BP: (!) 173/107 (!) 167/101  Pulse: 71 72  Resp: 14 14  Temp: 36.6 C 36.7 C  SpO2: 94% 91%    Last Pain:  Vitals:   03/02/17 1500  TempSrc:   PainSc: 2                  Nevah Dalal

## 2017-03-02 NOTE — Anesthesia Procedure Notes (Signed)
Procedure Name: LMA Insertion Date/Time: 03/02/2017 12:11 PM Performed by: Claudia Desanctis, CRNA Pre-anesthesia Checklist: Emergency Drugs available, Patient identified, Suction available and Patient being monitored Patient Re-evaluated:Patient Re-evaluated prior to induction Oxygen Delivery Method: Circle system utilized Preoxygenation: Pre-oxygenation with 100% oxygen Induction Type: IV induction LMA: LMA inserted LMA Size: 5.0 Number of attempts: 1 Placement Confirmation: positive ETCO2 and breath sounds checked- equal and bilateral Tube secured with: Tape Dental Injury: Teeth and Oropharynx as per pre-operative assessment

## 2017-03-07 LAB — AEROBIC/ANAEROBIC CULTURE W GRAM STAIN (SURGICAL/DEEP WOUND): Culture: NO GROWTH

## 2017-03-07 LAB — AEROBIC/ANAEROBIC CULTURE (SURGICAL/DEEP WOUND)

## 2017-03-17 DIAGNOSIS — Z9889 Other specified postprocedural states: Secondary | ICD-10-CM | POA: Diagnosis not present

## 2017-03-17 NOTE — Brief Op Note (Signed)
03/02/2017  8:12 PM  PATIENT:  Jeffery Simon  52 y.o. male  PRE-OPERATIVE DIAGNOSIS:  RIGHT FOOT PAINFUL 5TH BUNIONETTE  POST-OPERATIVE DIAGNOSIS:  RIGHT FOOT PAINFUL 5TH BUNIONETTE  PROCEDURE:  Procedure(s) with comments: RIGHT 5TH BUNIONETTE EXCISION (Right) - Dywane Peruski NEED 1HR FOR PROCEDURE  SURGEON:  Surgeon(s) and Role:    Dorna Leitz, MD - Primary  PHYSICIAN ASSISTANT:   ASSISTANTS: bethune   ANESTHESIA:   general  EBL:  0 mL   BLOOD ADMINISTERED:none  DRAINS: none   LOCAL MEDICATIONS USED:  MARCAINE     SPECIMEN:  No Specimen  DISPOSITION OF SPECIMEN:  N/A  COUNTS:  yes  TOURNIQUET:   Total Tourniquet Time Documented: Thigh (Right) - -[id number] minutes Total: Thigh (Right) - -[id number] minutes   DICTATION: .Other Dictation: Dictation Number [id number]  PLAN OF CARE: Discharge to home after PACU  PATIENT DISPOSITION:  PACU - hemodynamically stable.   Delay start of Pharmacological VTE agent (>24hrs) due to surgical blood loss or risk of bleeding: no

## 2017-03-18 NOTE — Op Note (Signed)
NAMEBASILIO, Jeffery Simon NO.:  [id number]  MEDICAL RECORD NO.:  [id number]  LOCATION:                                 FACILITY:  PHYSICIAN:  Alta Corning, M.D.        DATE OF BIRTH:  DATE OF PROCEDURE:  03/02/2017 DATE OF DISCHARGE:                              OPERATIVE REPORT   PREOPERATIVE DIAGNOSIS:  Painful right bunionette.  POSTOPERATIVE DIAGNOSIS:  Painful right bunionette.  PROCEDURE:  Repair of right bunionette with excision of the metatarsal head.  SURGEON:  Alta Corning, MD.  Terrence DupontModena Slater.  ANESTHESIA:  General.  BRIEF HISTORY:  [name] is a 52 year old male with a long history of having significant complaints of right foot pain.  He had been treated conservatively for prolonged period of time.  After failure of all conservative care, he was taken to the operating room for excision of 5th metatarsal head and correction of 5th bunionette.  He was having significant pain, light activity pain and had a large prominent area on this side.  DESCRIPTION OF PROCEDURE:  The patient was taken to the operating room and after adequate anesthesia was obtained with general anesthetic, the patient was placed supine on the operating table.  The right leg was prepped and draped in usual sterile fashion.  Following this, an incision was made over the 5th metatarsal head and subcutaneous tissue was dissected down the level of the fifth metatarsophalangeal joint. The capsule was opened and the 5th metatarsal head was identified and the metatarsal head was resected.  Synovitis was removed.  The head was smoothed down.  Once this was done, the capsule was closed and the wounds were irrigated thoroughly and the skin was closed with nylon. Sterile compressive dressing was applied.  The patient was taken to the recovery room where he was noted to be in satisfactory condition. Estimated blood loss for the procedure was minimal.     Alta Corning,  M.D.     [name]  D:  03/17/2017  T:  03/17/2017  Job:  [id number]

## 2017-04-07 DIAGNOSIS — Z9889 Other specified postprocedural states: Secondary | ICD-10-CM | POA: Diagnosis not present

## 2017-04-28 DIAGNOSIS — I10 Essential (primary) hypertension: Secondary | ICD-10-CM | POA: Diagnosis not present

## 2017-04-28 DIAGNOSIS — Z6841 Body Mass Index (BMI) 40.0 and over, adult: Secondary | ICD-10-CM | POA: Diagnosis not present

## 2017-04-28 DIAGNOSIS — R600 Localized edema: Secondary | ICD-10-CM | POA: Diagnosis not present

## 2017-05-19 DIAGNOSIS — Z6841 Body Mass Index (BMI) 40.0 and over, adult: Secondary | ICD-10-CM | POA: Diagnosis not present

## 2017-05-19 DIAGNOSIS — R600 Localized edema: Secondary | ICD-10-CM | POA: Diagnosis not present

## 2017-05-19 DIAGNOSIS — I10 Essential (primary) hypertension: Secondary | ICD-10-CM | POA: Diagnosis not present

## 2017-05-30 ENCOUNTER — Observation Stay (HOSPITAL_COMMUNITY)
Admission: EM | Admit: 2017-05-30 | Discharge: 2017-06-01 | Disposition: A | Discharge status: Home / Self Care | Payer: BLUE CROSS/BLUE SHIELD | Attending: Emergency Medicine | Admitting: Emergency Medicine

## 2017-05-30 ENCOUNTER — Emergency Department (HOSPITAL_COMMUNITY): Payer: BLUE CROSS/BLUE SHIELD

## 2017-05-30 ENCOUNTER — Encounter (HOSPITAL_COMMUNITY): Payer: Self-pay

## 2017-05-30 DIAGNOSIS — L03113 Cellulitis of right upper limb: Secondary | ICD-10-CM | POA: Diagnosis not present

## 2017-05-30 DIAGNOSIS — R079 Chest pain, unspecified: Secondary | ICD-10-CM | POA: Diagnosis present

## 2017-05-30 DIAGNOSIS — Z79899 Other long term (current) drug therapy: Secondary | ICD-10-CM | POA: Insufficient documentation

## 2017-05-30 DIAGNOSIS — I872 Venous insufficiency (chronic) (peripheral): Principal | ICD-10-CM | POA: Insufficient documentation

## 2017-05-30 DIAGNOSIS — R5383 Other fatigue: Secondary | ICD-10-CM | POA: Diagnosis not present

## 2017-05-30 DIAGNOSIS — J810 Acute pulmonary edema: Secondary | ICD-10-CM

## 2017-05-30 DIAGNOSIS — G4733 Obstructive sleep apnea (adult) (pediatric): Secondary | ICD-10-CM | POA: Insufficient documentation

## 2017-05-30 DIAGNOSIS — E669 Obesity, unspecified: Secondary | ICD-10-CM | POA: Insufficient documentation

## 2017-05-30 DIAGNOSIS — I10 Essential (primary) hypertension: Secondary | ICD-10-CM | POA: Diagnosis not present

## 2017-05-30 DIAGNOSIS — R0602 Shortness of breath: Secondary | ICD-10-CM | POA: Diagnosis not present

## 2017-05-30 DIAGNOSIS — R0789 Other chest pain: Secondary | ICD-10-CM | POA: Diagnosis not present

## 2017-05-30 LAB — CBC WITH DIFFERENTIAL/PLATELET
BASOS ABS: 0 10*3/uL (ref 0.0–0.1)
BASOS PCT: 0 %
EOS PCT: 3 %
Eosinophils Absolute: 0.3 10*3/uL (ref 0.0–0.7)
HCT: 37 % — ABNORMAL LOW (ref 39.0–52.0)
Hemoglobin: 11.7 g/dL — ABNORMAL LOW (ref 13.0–17.0)
LYMPHS PCT: 17 %
Lymphs Abs: 1.8 10*3/uL (ref 0.7–4.0)
MCH: 30.5 pg (ref 26.0–34.0)
MCHC: 31.6 g/dL (ref 30.0–36.0)
MCV: 96.6 fL (ref 78.0–100.0)
Monocytes Absolute: 0.7 10*3/uL (ref 0.1–1.0)
Monocytes Relative: 6 %
Neutro Abs: 7.8 10*3/uL — ABNORMAL HIGH (ref 1.7–7.7)
Neutrophils Relative %: 74 %
PLATELETS: 203 10*3/uL (ref 150–400)
RBC: 3.83 MIL/uL — ABNORMAL LOW (ref 4.22–5.81)
RDW: 13.5 % (ref 11.5–15.5)
WBC: 10.6 10*3/uL — ABNORMAL HIGH (ref 4.0–10.5)

## 2017-05-30 LAB — BASIC METABOLIC PANEL
ANION GAP: 9 (ref 5–15)
BUN: 12 mg/dL (ref 6–20)
CO2: 31 mmol/L (ref 22–32)
Calcium: 8.7 mg/dL — ABNORMAL LOW (ref 8.9–10.3)
Chloride: 101 mmol/L (ref 101–111)
Creatinine, Ser: 0.84 mg/dL (ref 0.61–1.24)
GFR calc Af Amer: >60 mL/min (ref >60)
GLUCOSE: 94 mg/dL (ref 65–99)
Potassium: 4 mmol/L (ref 3.5–5.1)
Sodium: 141 mmol/L (ref 135–145)

## 2017-05-30 LAB — I-STAT TROPONIN, ED: Troponin i, poc: 0.01 ng/mL (ref 0.00–0.08)

## 2017-05-30 LAB — I-STAT CG4 LACTIC ACID, ED: LACTIC ACID, VENOUS: 0.6 mmol/L (ref 0.5–1.9)

## 2017-05-30 LAB — BRAIN NATRIURETIC PEPTIDE: B NATRIURETIC PEPTIDE 5: 102.3 pg/mL — AB (ref 0.0–100.0)

## 2017-05-30 NOTE — ED Provider Notes (Signed)
Patient placed in Quick Look pathway, seen and evaluated   Chief Complaint: right hand infection and shortness of breath  HPI:   Pt reports pain and swelling to the right hand for several days. PT states he had a pimple to the right hand that he picked at, states since then increased pain and swelling. Pt went to UC, sent here bc had fever of 101.6, oxygen sat 98% on RA. Pt reports seasonal allergies. Denies cough. States has had shortness of breath and heaviness to chest pain for about 3 weeks that is worse with excertion. No hx of heart or lung problems.   ROS: positive for sob, chest pressure. Right hand swelling, wound, drainage, fever. No orthopnea, no cough  Physical Exam:   Gen: No distress  Neuro: Awake and Alert  Skin: Warm    Focused Exam: NAD. Lungs with slight rales at bseline, otherwise clear. Regular HR and rhythm. 2+ peripheral edema. Right hand swelling with dranng wound proximal to 4th MCP joint. Full rom of all joints. Erythema spreading from the wound over dorsal hand and forearm.    Initiation of care has begun. The patient has been counseled on the process, plan, and necessity for staying for the completion/evaluation, and the remainder of the medical screening examination  Pt in ED with two separate complaints. Pt with chest tightness and SOB for few weeks. Hypoxic at UC, here oxygen around 92% on RA. Lungs with some rales, no wheezing, no rhonchi. Will check ecg, trop, bnp, cxr. alsohaving right hand swelling with obvious draining abscess. No concern for joint infection or tenosynovitis, full rom of all fingers. There is cellulitic changes up forearm. Will need antibiotics. Labs pending with lactic acid. Temp 101.6 at UC  Vitals:   05/30/17 2030  BP: (!) 176/102  Pulse: 96  Resp: 20  Temp: 98.8 F (37.1 C)  TempSrc: Oral  SpO2: 92%      Jeannett Senior, PA-C 05/30/17 2032    Noemi Chapel, MD 06/01/17 [phone]

## 2017-05-30 NOTE — ED Triage Notes (Signed)
Pt states that he was seen at [facility] for cellulitis for the R hand that is draining, warm to touch and swollen with fevers. SOB has been going on for the past several weeks, worse today, denies cough, stats 92% RA, wears CPAP at night, some central chest pressure.

## 2017-05-31 ENCOUNTER — Encounter (HOSPITAL_COMMUNITY): Payer: Self-pay

## 2017-05-31 DIAGNOSIS — R079 Chest pain, unspecified: Secondary | ICD-10-CM

## 2017-05-31 DIAGNOSIS — R0902 Hypoxemia: Secondary | ICD-10-CM

## 2017-05-31 DIAGNOSIS — L03113 Cellulitis of right upper limb: Secondary | ICD-10-CM

## 2017-05-31 DIAGNOSIS — J810 Acute pulmonary edema: Secondary | ICD-10-CM | POA: Diagnosis not present

## 2017-05-31 DIAGNOSIS — I10 Essential (primary) hypertension: Secondary | ICD-10-CM

## 2017-05-31 HISTORY — DX: Chest pain, unspecified: R07.9

## 2017-05-31 LAB — CBC
HEMATOCRIT: 38.7 % — AB (ref 39.0–52.0)
HEMOGLOBIN: 12.4 g/dL — AB (ref 13.0–17.0)
MCH: 30.6 pg (ref 26.0–34.0)
MCHC: 32 g/dL (ref 30.0–36.0)
MCV: 95.6 fL (ref 78.0–100.0)
Platelets: 197 10*3/uL (ref 150–400)
RBC: 4.05 MIL/uL — AB (ref 4.22–5.81)
RDW: 13.4 % (ref 11.5–15.5)
WBC: 10.4 10*3/uL (ref 4.0–10.5)

## 2017-05-31 LAB — URINALYSIS, ROUTINE W REFLEX MICROSCOPIC
Bilirubin Urine: NEGATIVE
Glucose, UA: NEGATIVE mg/dL
HGB URINE DIPSTICK: NEGATIVE
Ketones, ur: NEGATIVE mg/dL
LEUKOCYTES UA: NEGATIVE
Nitrite: NEGATIVE
PROTEIN: NEGATIVE mg/dL
SPECIFIC GRAVITY, URINE: 1.004 — AB (ref 1.005–1.030)
pH: 6 (ref 5.0–8.0)

## 2017-05-31 LAB — BASIC METABOLIC PANEL
ANION GAP: 10 (ref 5–15)
BUN: 11 mg/dL (ref 6–20)
CHLORIDE: 99 mmol/L — AB (ref 101–111)
CO2: 27 mmol/L (ref 22–32)
Calcium: 8.7 mg/dL — ABNORMAL LOW (ref 8.9–10.3)
Creatinine, Ser: 0.77 mg/dL (ref 0.61–1.24)
GFR calc Af Amer: >60 mL/min (ref >60)
GFR calc non Af Amer: >60 mL/min (ref >60)
Glucose, Bld: 246 mg/dL — ABNORMAL HIGH (ref 65–99)
Potassium: 4 mmol/L (ref 3.5–5.1)
SODIUM: 136 mmol/L (ref 135–145)

## 2017-05-31 LAB — HEMOGLOBIN A1C
Hgb A1c MFr Bld: 5.5 % (ref 4.8–5.6)
MEAN PLASMA GLUCOSE: 111.15 mg/dL

## 2017-05-31 LAB — LIPID PANEL
CHOLESTEROL: 170 mg/dL (ref 0–200)
HDL: 46 mg/dL (ref >40)
LDL CALC: 113 mg/dL — AB (ref 0–99)
Total CHOL/HDL Ratio: 3.7 RATIO
Triglycerides: 56 mg/dL (ref <150)
VLDL: 11 mg/dL (ref 0–40)

## 2017-05-31 LAB — I-STAT TROPONIN, ED: Troponin i, poc: 0 ng/mL (ref 0.00–0.08)

## 2017-05-31 LAB — TROPONIN I

## 2017-05-31 LAB — TSH: TSH: 1.022 u[IU]/mL (ref 0.350–4.500)

## 2017-05-31 MED ORDER — ONDANSETRON HCL 4 MG/2ML IJ SOLN: 4.0000 mg | Freq: Four times a day (QID) | INTRAMUSCULAR | Status: DC | PRN

## 2017-05-31 MED ORDER — ASPIRIN EC 325 MG PO TBEC
325.0000 mg | DELAYED_RELEASE_TABLET | Freq: Once | ORAL | Status: AC
  Administered 2017-05-31: 325 mg via ORAL
  Filled 2017-05-31: qty 1

## 2017-05-31 MED ORDER — LORATADINE 10 MG PO TABS
10.0000 mg | ORAL_TABLET | Freq: Every day | ORAL | Status: DC
  Administered 2017-05-31 – 2017-06-01 (×2): 10 mg via ORAL
  Filled 2017-05-31 (×2): qty 1

## 2017-05-31 MED ORDER — ALBUTEROL SULFATE (2.5 MG/3ML) 0.083% IN NEBU
5.0000 mg | INHALATION_SOLUTION | Freq: Once | RESPIRATORY_TRACT | Status: AC
  Administered 2017-05-31: 5 mg via RESPIRATORY_TRACT
  Filled 2017-05-31: qty 6

## 2017-05-31 MED ORDER — ENOXAPARIN SODIUM 40 MG/0.4ML ~~LOC~~ SOLN
40.0000 mg | SUBCUTANEOUS | Status: DC
  Administered 2017-05-31: 40 mg via SUBCUTANEOUS
  Filled 2017-05-31 (×2): qty 0.4

## 2017-05-31 MED ORDER — SODIUM CHLORIDE 0.9 % IV SOLN
100.0000 mg | Freq: Once | INTRAVENOUS | Status: DC
  Filled 2017-05-31: qty 100

## 2017-05-31 MED ORDER — OMEGA-3-ACID ETHYL ESTERS 1 G PO CAPS
1.0000 g | ORAL_CAPSULE | Freq: Every day | ORAL | Status: DC
  Administered 2017-05-31 – 2017-06-01 (×2): 1 g via ORAL
  Filled 2017-05-31 (×2): qty 1

## 2017-05-31 MED ORDER — FUROSEMIDE 10 MG/ML IJ SOLN
40.0000 mg | Freq: Once | INTRAMUSCULAR | Status: AC
  Administered 2017-05-31: 40 mg via INTRAVENOUS
  Filled 2017-05-31: qty 4

## 2017-05-31 MED ORDER — IPRATROPIUM-ALBUTEROL 0.5-2.5 (3) MG/3ML IN SOLN: 3.0000 mL | Freq: Four times a day (QID) | RESPIRATORY_TRACT | Status: DC | PRN

## 2017-05-31 MED ORDER — FUROSEMIDE 10 MG/ML IJ SOLN
40.0000 mg | Freq: Two times a day (BID) | INTRAMUSCULAR | Status: DC
  Administered 2017-05-31 – 2017-06-01 (×2): 40 mg via INTRAVENOUS
  Filled 2017-05-31 (×2): qty 4

## 2017-05-31 MED ORDER — DOXYCYCLINE HYCLATE 100 MG PO TABS
100.0000 mg | ORAL_TABLET | Freq: Two times a day (BID) | ORAL | Status: DC
  Administered 2017-05-31 – 2017-06-01 (×2): 100 mg via ORAL
  Filled 2017-05-31 (×2): qty 1

## 2017-05-31 MED ORDER — ADULT MULTIVITAMIN W/MINERALS CH
1.0000 | ORAL_TABLET | Freq: Every day | ORAL | Status: DC
  Administered 2017-05-31 – 2017-06-01 (×2): 1 via ORAL
  Filled 2017-05-31 (×2): qty 1

## 2017-05-31 MED ORDER — IPRATROPIUM BROMIDE 0.02 % IN SOLN
0.5000 mg | Freq: Once | RESPIRATORY_TRACT | Status: AC
  Administered 2017-05-31: 0.5 mg via RESPIRATORY_TRACT
  Filled 2017-05-31: qty 2.5

## 2017-05-31 MED ORDER — ADULT MULTIVITAMIN W/MINERALS CH: 1.0000 | ORAL_TABLET | Freq: Every day | ORAL | Status: DC

## 2017-05-31 MED ORDER — LISINOPRIL 40 MG PO TABS
40.0000 mg | ORAL_TABLET | Freq: Every day | ORAL | Status: DC
  Administered 2017-05-31: 40 mg via ORAL
  Filled 2017-05-31: qty 2

## 2017-05-31 MED ORDER — ACETAMINOPHEN 325 MG PO TABS: 650.0000 mg | ORAL_TABLET | ORAL | Status: DC | PRN

## 2017-05-31 MED ORDER — METHYLPREDNISOLONE SODIUM SUCC 125 MG IJ SOLR
125.0000 mg | Freq: Once | INTRAMUSCULAR | Status: AC
  Administered 2017-05-31: 125 mg via INTRAVENOUS
  Filled 2017-05-31: qty 2

## 2017-05-31 MED ORDER — CLINDAMYCIN HCL 150 MG PO CAPS
300.0000 mg | ORAL_CAPSULE | Freq: Once | ORAL | Status: AC
  Administered 2017-05-31: 300 mg via ORAL
  Filled 2017-05-31: qty 2

## 2017-05-31 NOTE — ED Provider Notes (Signed)
[facility] EMERGENCY DEPARTMENT Provider Note   CSN: [id number] Arrival date & time: 05/30/17  2014     History   Chief Complaint Chief Complaint  Patient presents with  . Shortness of Breath    HPI Jeffery Simon is a 52 y.o. male.  HPI  Past Medical History:  Diagnosis Date  . Arthritis    feet and hands  . HTN (hypertension)   . Obesity   . Sleep apnea    cpap    Patient Active Problem List   Diagnosis Date Noted  . Chest pain 05/31/2017  . Hypoxia   . Cellulitis of right hand   . Bunionette of right foot 03/02/2017  . Cellulitis and abscess of left leg 04/25/2016  . Left leg cellulitis 04/24/2016  . Primary hypertension 04/24/2016  . Morbid obesity with BMI of 50.0-59.9, adult ([place]) 04/24/2016  . Transaminitis 04/24/2016  . Varicose veins of bilateral lower extremities with other complications 76/28/3151  . OSA (obstructive sleep apnea) 05/03/2012    Past Surgical History:  Procedure Laterality Date  . BUNIONECTOMY     03-02-17 Dr. Berenice Primas  . BUNIONECTOMY Right 03/02/2017   Procedure: RIGHT 5TH BUNIONETTE EXCISION;  Surgeon: Dorna Leitz, MD;  Location: [facility];  Service: Orthopedics;  Laterality: Right;  GRAVES NEED 1HR FOR PROCEDURE  . ENDOVENOUS ABLATION SAPHENOUS VEIN W/ LASER Right 04/06/2016   endovenous laser ablation right greater saphenous vein by Tinnie Gens MD   . I&D EXTREMITY Left 04/25/2016   Procedure: IRRIGATION AND DEBRIDEMENT EXTREMITY;  Surgeon: Rod Can, MD;  Location: [facility];  Service: Orthopedics;  Laterality: Left;        Home Medications    Prior to Admission medications   Medication Sig Start Date End Date Taking? Authorizing Provider  cetirizine (ZYRTEC) 10 MG tablet Take 10 mg by mouth as needed for allergies.   Yes [provider]  fish oil-omega-3 fatty acids 1000 MG capsule Take 1 g by mouth daily.   Yes [provider]  furosemide (LASIX) 20 MG tablet Take 20 mg by mouth 2 (two)  times daily.   Yes [provider]  GARLIC PO Take 1 tablet by mouth daily.   Yes [provider]  ibuprofen (ADVIL,MOTRIN) 200 MG tablet Take 400 mg by mouth every 6 (six) hours as needed for headache or moderate pain.   Yes [provider]  lisinopril (PRINIVIL,ZESTRIL) 40 MG tablet Take 40 mg by mouth daily.   Yes [provider]  Misc Natural Products (GLUCOSAMINE CHOND DOUBLE STR) TABS Take 1 tablet by mouth daily.   Yes [provider]  Multiple Vitamins-Minerals (MULTIVITAMIN WITH MINERALS) tablet Take 1 tablet by mouth daily.   Yes [provider]  doxycycline (VIBRA-TABS) 100 MG tablet Take 1 tablet (100 mg total) by mouth 2 (two) times daily. Patient not taking: Reported on 05/31/2017 03/02/17   Gary Fleet, PA-C  oxyCODONE-acetaminophen (PERCOCET/ROXICET) 5-325 MG tablet Take 1-2 tablets by mouth every 6 (six) hours as needed for severe pain. Patient not taking: Reported on 05/31/2017 03/02/17   Gary Fleet, PA-C    Family History Family History  Problem Relation Age of Onset  . Hypertension Mother   . Hypertension Father     Social History Social History   Tobacco Use  . Smoking status: Never Smoker  . Smokeless tobacco: Never Used  . Tobacco comment: cigars occ  Substance Use Topics  . Alcohol use: No    Frequency: Never  .  Drug use: No     Allergies   Patient has no known allergies.   Review of Systems Review of Systems  Constitutional: Positive for fatigue and fever. Negative for chills.  Respiratory: Positive for chest tightness and shortness of breath. Negative for cough, wheezing and stridor.   Cardiovascular: Positive for chest pain and leg swelling. Negative for palpitations.  Gastrointestinal: Negative for abdominal pain, nausea and vomiting.  All other systems reviewed and are negative.    Physical Exam Updated Vital Signs BP (!) 148/89   Pulse 82   Temp 98.4 F (36.9 C) (Oral)   Resp 17    SpO2 93%   Physical Exam  Constitutional: He appears well-developed and well-nourished.  HENT:  Head: Normocephalic and atraumatic.  Eyes: Conjunctivae are normal.  Neck: Neck supple.  Cardiovascular: Normal rate and regular rhythm.  No murmur heard. Pulmonary/Chest: Effort normal. No stridor. No respiratory distress. He has rales in the right lower field and the left lower field. He exhibits edema.  Abdominal: Soft. There is no tenderness.  Musculoskeletal:       Right lower leg: He exhibits edema.       Left lower leg: He exhibits edema.  Neurological: He is alert.  Skin: Skin is warm and dry.  Erythema over medial posterior hand w/ scab and diffuse swelling of the hand. Normal ROM.   Psychiatric: He has a normal mood and affect.  Nursing note and vitals reviewed.    ED Treatments / Results  Labs (all labs ordered are listed, but only abnormal results are displayed) Labs Reviewed  CBC WITH DIFFERENTIAL/PLATELET - Abnormal; Notable for the following components:      Result Value   WBC 10.6 (*)    RBC 3.83 (*)    Hemoglobin 11.7 (*)    HCT 37.0 (*)    Neutro Abs 7.8 (*)    All other components within normal limits  BASIC METABOLIC PANEL - Abnormal; Notable for the following components:   Calcium 8.7 (*)    All other components within normal limits  BRAIN NATRIURETIC PEPTIDE - Abnormal; Notable for the following components:   B Natriuretic Peptide 102.3 (*)    All other components within normal limits  URINALYSIS, ROUTINE W REFLEX MICROSCOPIC - Abnormal; Notable for the following components:   Color, Urine STRAW (*)    Specific Gravity, Urine 1.004 (*)    All other components within normal limits  CBC - Abnormal; Notable for the following components:   RBC 4.05 (*)    Hemoglobin 12.4 (*)    HCT 38.7 (*)    All other components within normal limits  BASIC METABOLIC PANEL - Abnormal; Notable for the following components:   Chloride 99 (*)    Glucose, Bld 246 (*)     Calcium 8.7 (*)    All other components within normal limits  LIPID PANEL - Abnormal; Notable for the following components:   LDL Cholesterol 113 (*)    All other components within normal limits  TROPONIN I  TSH  HEMOGLOBIN A1C  HIV ANTIBODY (ROUTINE TESTING)  TROPONIN I  TROPONIN I  CBC  BASIC METABOLIC PANEL  I-STAT CG4 LACTIC ACID, ED  I-STAT TROPONIN, ED  I-STAT TROPONIN, ED  I-STAT TROPONIN, ED    EKG EKG Interpretation  Date/Time:  Monday May 30 2017 20:25:47 EDT Ventricular Rate:  98 PR Interval:  158 QRS Duration: 88 QT Interval:  346 QTC Calculation: 441 R Axis:   43 Text Interpretation:  Normal sinus rhythm Normal ECG No significant change since last tracing Confirmed by Wandra Arthurs [phone]) on 05/31/2017 7:43:04 AM   Radiology Dg Chest 2 View  Result Date: 05/30/2017 CLINICAL DATA:  SOB, chest pressure, congestion X 1 dayHX: HTN, non-smoker EXAM: CHEST - 2 VIEW COMPARISON:  None. FINDINGS: Irregularly thickened bronchovascular markings are noted bilaterally. No evidence of pneumonia or pulmonary edema. No pleural effusion or pneumothorax. Cardiac silhouette is normal in size. No mediastinal or hilar masses. No convincing adenopathy. Skeletal structures are intact. IMPRESSION: 1. Thickened bronchovascular markings bilaterally. This could be chronic. Acute bronchial infection/inflammation should be considered the proper clinical setting. 2. No evidence of pneumonia or pulmonary edema. Electronically Signed   By: Lajean Manes M.D.   On: 05/30/2017 21:39    Procedures Procedures (including critical care time)  Medications Ordered in ED Medications  loratadine (CLARITIN) tablet 10 mg (10 mg Oral Given 05/31/17 1505)  omega-3 acid ethyl esters (LOVAZA) capsule 1 g (has no administration in time range)  acetaminophen (TYLENOL) tablet 650 mg (has no administration in time range)  ondansetron (ZOFRAN) injection 4 mg (has no administration in time range)  enoxaparin  (LOVENOX) injection 40 mg (has no administration in time range)  doxycycline (VIBRA-TABS) tablet 100 mg (has no administration in time range)  furosemide (LASIX) injection 40 mg (has no administration in time range)  ipratropium-albuterol (DUONEB) 0.5-2.5 (3) MG/3ML nebulizer solution 3 mL (has no administration in time range)  multivitamin with minerals tablet 1 tablet (has no administration in time range)  clindamycin (CLEOCIN) capsule 300 mg (300 mg Oral Given 05/31/17 0917)  furosemide (LASIX) injection 40 mg (40 mg Intravenous Given 05/31/17 0843)  albuterol (PROVENTIL) (2.5 MG/3ML) 0.083% nebulizer solution 5 mg (5 mg Nebulization Given 05/31/17 0826)  ipratropium (ATROVENT) nebulizer solution 0.5 mg (0.5 mg Nebulization Given 05/31/17 0826)  methylPREDNISolone sodium succinate (SOLU-MEDROL) 125 mg/2 mL injection 125 mg (125 mg Intravenous Given 05/31/17 0845)  aspirin EC tablet 325 mg (325 mg Oral Given 05/31/17 1505)     Initial Impression / Assessment and Plan / ED Course  I have reviewed the triage vital signs and the nursing notes.  Pertinent labs & imaging results that were available during my care of the patient were reviewed by me and considered in my medical decision making (see chart for details).     Patient is a 52 year old male with a past medical history of hypertension who comes in today after being sent from urgent care for chest pressure, dyspnea on exertion and oxygen saturation in the upper 80s.  Patient initially went to urgent care to be evaluated for an area of erythema and swelling of the right hand, however patient was found to have the previously mentioned findings and was sent for further evaluation.  Patient states that he has had substernal chest pressure that is intermittent for the past 3 weeks.  Patient denies any alleviating or aggravating factors.  Patient is on CPAP at home and states that he has been compliant.  Patient has additionally noticed that he has had  dyspnea on exertion for approximately same amount of time.  Patient was febrile at urgent care but states that he has not noticed any fevers previously.  Patient denies cough, hemoptysis, lower extremity pain and states that his level of edema is his baseline.  Patient states that he first noticed what he thought was a pimple on the dorsum of the right hand approximately 4 days ago which he states has worsened  since that time.  Physical exam: Patient with a 4 Center meter diameter area of erythema with a central scab on the medial portion of the dorsum of the right hand.  There is also diffuse swelling of the dorsum of the hand with an area of induration at the site of erythema.  No drainage noted.  Patient with full range of motion.  Patient with bibasilar crackles, otherwise clear to auscultation.  Cardiac abdominal exams within normal limits.  Patient with +2 edema of the lower extremities which patient states is his baseline.  Patient denies any tenderness to palpation of the lower extremities.  Remainder physical exam the normal limits.  Patient with mildly elevated BNP, remainder of laboratory work-up largely within normal limits.  Patient given 40 mg IV Lasix, however patient still with new oxygen requirement despite significant urinary output.  Will admit patient for further evaluation and care.  Final Clinical Impressions(s) / ED Diagnoses   Final diagnoses:  None    ED Discharge Orders    None       Chapman Moss, MD 05/31/17 1702    Drenda Freeze, MD 06/01/17 [phone]

## 2017-05-31 NOTE — ED Notes (Signed)
Sandwich meal given to patient.

## 2017-05-31 NOTE — ED Notes (Signed)
Doctor at bedside.

## 2017-05-31 NOTE — ED Notes (Signed)
Admit providers at bedside 

## 2017-05-31 NOTE — H&P (Addendum)
[facility] Admission History and Physical Service Pager: [phone]  Patient name: Jeffery Simon Medical record number: [id number] Date of birth: 1965/03/17 Age: 52 y.o. Gender: male  Primary Care Provider: Greig Right, MD Consultants: none Code Status: full  Chief Complaint: Shortness of breath and cellulitis  Assessment and Plan: [name] is a 52 y.o. male presenting with three weeks of worsening SoB and chest pressure. PMH is significant for HTN, OSA with CPAP, chronic venous insufficiency, MRSA cellulitis of the foot, questionable COPD vs asthma per patient. Patient is likely experiencing new-onset CHF symptoms given HTN, obesity, pulmonary hypertension.  Will plan to admit for cardiology workup including echocardiogram and diuresis.   Dyspnea on Exertion: Likely cardiovascular in nature although potentially 2/2 lung pathology.  Mildly elevated BNP of 102.  Will plan to diurese and get echocardiogram to evaluate for structural changes.  Unlikely to be ACS given ED neg trop/EKG.  Will risk stratify for cardiovascular disease.  - HgbA1c, HIV, Lipid panel, TSH - Trend troponins - Daily BMP - IV Furosemide 40mg  BID - aspirin 325mg  x1 dose at admission - If chest pain, STAT EKG and troponin - Tylenol PRN   Cellulitis, R hand: Patient received dose of clindamycin in ED, reports swelling already decreased.  Will switch to doxycycline.  - S/p x1 dose Clindamycin 300mg  - Doxycycline 100mg  q12h (5/7- )  Chronic Medical Conditions managed on home medications: OSA: CPAP HTN: Lisinopril 40mg  daily Seasonal Allergies: Loratadine 10mg  daily   FEN/GI: Heart Healthy Prophylaxis: Lovenox  Disposition: admit to floor   History of Present Illness:  Jeffery Simon is a 52 y.o. male presenting with three weeks of shortness of breath.  He has a PMH significant for OSA on CPAP, HTN, surgery for MRSA cellulitis of the foot and COPD.  He reports he was  previously able to walk across a shop and back without becoming short of breath and over the past few weeks has noticed dyspnea with crossing the room.  He has also noticed weight gain of 25lbs in the past month despite no change in diet.  He reports worsening of leg swelling (he has chronic venous insufficiency) and his arms feel swollen as well.  A month ago, his PCP made medication changes so that he is now taking lisinopril and lasix daily.  He denies cough, can sleep flat (uses CPAP), denies recent flu-like symptoms, abdominal pain, headaches, dizziness or chest pain.  Does describe a chest pressure that does not radiate and is worse with exercise.  He does report a childhood history of asthma for which he took allergy shots but never used an inhaler.  He smokes cigars occasionally, no illicit drug use or alcohol use.  Family history notable only for hypertension.   He went to urgent care yesterday for cellulitis of the right hand.  There he was noted to have desaturations in the waiting room and was sent to the ED.  In the ED he was given clindamycin for his cellulitis.  He was started on IV lasix 40mg  and had good uop response initially.  EKG was sinus rhythm without sings of acute ischemic changes.  He was given tyelnol for slight fever and breathing treatment (ipratropium and methylprednisolone).   Review Of Systems: Per HPI with the following additions:  Review of Systems  Constitutional: Negative for weight loss.  HENT: Negative for congestion and sore throat.   Respiratory: Positive for shortness of breath and wheezing. Negative for cough.  Cardiovascular: Positive for leg swelling. Negative for orthopnea and claudication.  Gastrointestinal: Negative for abdominal pain and vomiting.  Genitourinary: Negative for dysuria and urgency.  Neurological: Negative for dizziness and headaches.    Patient Active Problem List   Diagnosis Date Noted  . Chest pain 05/31/2017  . Bunionette of right  foot 03/02/2017  . Cellulitis and abscess of left leg 04/25/2016  . Left leg cellulitis 04/24/2016  . HTN (hypertension) 04/24/2016  . Morbid obesity with BMI of 50.0-59.9, adult ([place]) 04/24/2016  . Transaminitis 04/24/2016  . Varicose veins of bilateral lower extremities with other complications 75/17/0017  . OSA (obstructive sleep apnea) 05/03/2012    Past Medical History: Past Medical History:  Diagnosis Date  . Arthritis    feet and hands  . HTN (hypertension)   . Obesity   . Sleep apnea    cpap    Past Surgical History: Past Surgical History:  Procedure Laterality Date  . BUNIONECTOMY     03-02-17 Dr. Berenice Primas  . BUNIONECTOMY Right 03/02/2017   Procedure: RIGHT 5TH BUNIONETTE EXCISION;  Surgeon: Dorna Leitz, MD;  Location: [facility];  Service: Orthopedics;  Laterality: Right;  GRAVES NEED 1HR FOR PROCEDURE  . ENDOVENOUS ABLATION SAPHENOUS VEIN W/ LASER Right 04/06/2016   endovenous laser ablation right greater saphenous vein by Tinnie Gens MD   . I&D EXTREMITY Left 04/25/2016   Procedure: IRRIGATION AND DEBRIDEMENT EXTREMITY;  Surgeon: Rod Can, MD;  Location: [facility];  Service: Orthopedics;  Laterality: Left;    Social History: Social History   Tobacco Use  . Smoking status: Never Smoker  . Smokeless tobacco: Never Used  . Tobacco comment: cigars occ  Substance Use Topics  . Alcohol use: No    Frequency: Never  . Drug use: No   Additional social history: cigars occasionally  Please also refer to relevant sections of EMR.  Family History: Family History  Problem Relation Age of Onset  . Hypertension Mother   . Hypertension Father    Allergies and Medications: No Known Allergies No current facility-administered medications on file prior to encounter.    Current Outpatient Medications on File Prior to Encounter  Medication Sig Dispense Refill  . cetirizine (ZYRTEC) 10 MG tablet Take 10 mg by mouth as needed for allergies.    . fish oil-omega-3 fatty acids  1000 MG capsule Take 1 g by mouth daily.    . furosemide (LASIX) 20 MG tablet Take 20 mg by mouth 2 (two) times daily.    Marland Kitchen GARLIC PO Take 1 tablet by mouth daily.    Marland Kitchen ibuprofen (ADVIL,MOTRIN) 200 MG tablet Take 400 mg by mouth every 6 (six) hours as needed for headache or moderate pain.    Marland Kitchen lisinopril (PRINIVIL,ZESTRIL) 40 MG tablet Take 40 mg by mouth daily.    . Misc Natural Products (GLUCOSAMINE CHOND DOUBLE STR) TABS Take 1 tablet by mouth daily.    . Multiple Vitamins-Minerals (MULTIVITAMIN WITH MINERALS) tablet Take 1 tablet by mouth daily.    Marland Kitchen doxycycline (VIBRA-TABS) 100 MG tablet Take 1 tablet (100 mg total) by mouth 2 (two) times daily. (Patient not taking: Reported on 05/31/2017) 20 tablet 0  . oxyCODONE-acetaminophen (PERCOCET/ROXICET) 5-325 MG tablet Take 1-2 tablets by mouth every 6 (six) hours as needed for severe pain. (Patient not taking: Reported on 05/31/2017) 40 tablet 0    Objective: BP (!) 148/89   Pulse 82   Temp 98.4 F (36.9 C) (Oral)   Resp 17   SpO2 93%  Exam: General: well appearing, morbidly obese male in NAD Eyes: EOM intact Neck: supple Cardiovascular: RRR, nl S1, S2, no murmurs rubs or gallops.  Dorsal pedal pulses equal and full.  Respiratory: Mild crackles in bilateral lower lobes, otherwise good air movement. Nl work of breathing Gastrointestinal: nontender to palpation, BS+ Extremities: pitting 2+ edema up to knees. Spider veins but no skin discoloration or changes Derm: induration with scab and surrounding erythema ~3" diameter over lateral dorsum of right hand  Neuro: no focal deficits noted Psych: appropriate affect      Labs and Imaging: CBC BMET  Recent Labs  Lab 05/31/17 1531  WBC 10.4  HGB 12.4*  HCT 38.7*  PLT 197   Recent Labs  Lab 05/31/17 1531  NA 136  K 4.0  CL 99*  CO2 27  BUN 11  CREATININE 0.77  GLUCOSE 246*  CALCIUM 8.7*      Roche, Heidi A, Medical Student 05/31/2017, 1:48 PM MS4, [place] Intern pager: [phone], text pages welcome  I have personally seen and examined this patient with heidi Roche and agree with the above note. The following is my additional documentation.   Physical Exam: General: comfortable lying in bed, well developed, NAD with non-toxic appearance HEENT: normocephalic, atraumatic, moist mucous membranes Neck: supple, non-tender without lymphadenopathy, no JVD Cardiovascular: regular rate and rhythm without murmurs, rubs, or gallops Lungs: bibasilar crakles with normal work of breathing on 1 L [place] Abdomen: morbidly obese, soft, non-tender, non-distended, normoactive bowel sounds, no palpable hepatomegaly though limited due to body habitus Skin: warm, dry, no rashes or lesions, cap refill < 2 seconds Extremities: warm and well perfused, normal tone, 1+ pitting edema up to knees bilaterally, 2+ dorsal pedal pulse bilaterally  Assessment/Plan JAQUAVIS FELMLEE is a 52 y.o. male presenting with 3 weeks of progressive DOE and chest pressure concerning for ACS vs HF along with R-hand edema and erythema c/w R-hand cellulitis.  PMH significant for HTN, morbid obesity (BMI 55), OSA (on CPAP), BLE varicose veins (s/p ablation), and h/o LLE MRSA cellulitis (April 2018).  Patient presented to urgent care 05/30/2017 for concerns of right hand cellulitis with fever 101.79F.  During the visit, patient endorsed progressive shortness of breath and pressure-like sensation on chest over the last [redacted] weeks along with a 25 lb weight gain.  Patient was then transferred to the [facility] ED given concern for ACS vs HF. VSS upon arrival with exception to mild hypoxia down to 86% with ambulation which quickly improved with 1 L [place].  EKG NSR without ST changes or T wave abnormalities.  Initial troponin negative.  BNP 102 though given patient's morbid obesity, has little utility.  CXR without signs of cardiomegaly or focal infiltrates.  Patient admits to adherence with Lasix 20 mg BID at  home and denies use of NSAIDs. Patient has SOB when walking from front door of house to car which is several steps off the porch.  Patient was admitted to [facility] for further management of right hand cellulitis and signs of fluid overload with DOE and CP concerning for ACS vs HF.  1.  DOE  CP  Hypoxia: Concerning for ACS vs HF.  HEART score 4.  Has prior TTE 04/2016 with EF 55-60% though sub-optimal given body habitus. EKG NSR with st changes or t-wave abn and initial troponin neg making ACS less likely. Does have LE edema though at baseline per patient and wife. Bibasilar crackles and 25 lb wt gain concerning  for new onset HF. Will need to repeat TTE. Cannot rely on BNP given obesity. Hypoxia is minimal and worse with ambulation. Resolved with 1 L [place].  He has received ASA while in the ED. Albuterol was given without improvement.  UOP -2.5 since arrival following IV lasix 40 mg x1. Will continue to diurese and monitor for signs of ACS. - Admit to telemetry, attending Dr. Dorcas Mcmurray - Continue IV Lasix 40 mg twice daily, daily weights and I&O's - Trending troponin, repeat 12-lead EKG in AM 5/8 - TTE pending - Continuous pulse ox, supplementary O2 to keep sats greater than 92%  2.  R-hand cellulitis: Acute.  Is to be responding to antibiotics per patient.  Has history of MRSA of LLE.  S/p clindamycin in ED.  Refer to picture above. - Continue doxycycline 100 mg twice daily  3.  HTN: Chronic.  BP 140s/80s.  Home meds include losartan and Lasix. - Diuresis per above - Holding home losartan while diuresing to prevent AKI  4.  OSA  Morbid obesity: Chronic.  Adherent to CPAP.  Severely deconditioned. - Continue CPAP nightly  5.  Varicosities of LE: Chronic.  Managed by vascular.  History of ablations.  Lower extremity edema at baseline. - TED hose  Diet: Heart-healthy PPx: Lovenox Dispo: Pending ACS work-up and improvement of R-hand cellulitis. Ancipitate d./c home within 24-48 hrs.    Harriet Butte, DO [place], PGY-2 [place] Intern pager: [phone], text pages welcome  Call Pager [phone] for any questions or notifications regarding this patient  FMTS Attending Admission Note: Dorcas Mcmurray MD Attending pager:[phone] I  have seen and examined this patient, reviewed their chart. I have discussed this patient with the resident. I agree with the resident's findings, assessment and care plan.

## 2017-05-31 NOTE — ED Notes (Signed)
Patient ambulated to bathroom to urinate and returned. Steady gait no distress or shortness of breath however pulse oximetry 88% Room air.

## 2017-06-01 ENCOUNTER — Observation Stay (HOSPITAL_BASED_OUTPATIENT_CLINIC_OR_DEPARTMENT_OTHER): Payer: BLUE CROSS/BLUE SHIELD

## 2017-06-01 ENCOUNTER — Other Ambulatory Visit: Payer: Self-pay

## 2017-06-01 DIAGNOSIS — L03113 Cellulitis of right upper limb: Secondary | ICD-10-CM | POA: Diagnosis not present

## 2017-06-01 DIAGNOSIS — E669 Obesity, unspecified: Secondary | ICD-10-CM | POA: Diagnosis not present

## 2017-06-01 DIAGNOSIS — R079 Chest pain, unspecified: Secondary | ICD-10-CM | POA: Diagnosis not present

## 2017-06-01 DIAGNOSIS — J810 Acute pulmonary edema: Secondary | ICD-10-CM | POA: Diagnosis not present

## 2017-06-01 DIAGNOSIS — I872 Venous insufficiency (chronic) (peripheral): Secondary | ICD-10-CM | POA: Diagnosis not present

## 2017-06-01 DIAGNOSIS — G4733 Obstructive sleep apnea (adult) (pediatric): Secondary | ICD-10-CM | POA: Diagnosis not present

## 2017-06-01 DIAGNOSIS — I10 Essential (primary) hypertension: Secondary | ICD-10-CM | POA: Diagnosis not present

## 2017-06-01 DIAGNOSIS — I272 Pulmonary hypertension, unspecified: Secondary | ICD-10-CM | POA: Diagnosis not present

## 2017-06-01 LAB — BASIC METABOLIC PANEL
Anion gap: 7 (ref 5–15)
BUN: 13 mg/dL (ref 6–20)
CALCIUM: 8.7 mg/dL — AB (ref 8.9–10.3)
CO2: 32 mmol/L (ref 22–32)
CREATININE: 0.8 mg/dL (ref 0.61–1.24)
Chloride: 100 mmol/L — ABNORMAL LOW (ref 101–111)
GFR calc Af Amer: >60 mL/min (ref >60)
GLUCOSE: 139 mg/dL — AB (ref 65–99)
Potassium: 4.3 mmol/L (ref 3.5–5.1)
Sodium: 139 mmol/L (ref 135–145)

## 2017-06-01 LAB — BLOOD GAS, ARTERIAL
ACID-BASE EXCESS: 8.1 mmol/L — AB (ref 0.0–2.0)
Bicarbonate: 32 mmol/L — ABNORMAL HIGH (ref 20.0–28.0)
Drawn by: 277551
FIO2: 21
O2 SAT: 98.9 %
PCO2 ART: 43.7 mmHg (ref 32.0–48.0)
PO2 ART: 108 mmHg (ref 83.0–108.0)
Patient temperature: 98.6
pH, Arterial: 7.478 — ABNORMAL HIGH (ref 7.350–7.450)

## 2017-06-01 LAB — CBC
HCT: 37.8 % — ABNORMAL LOW (ref 39.0–52.0)
Hemoglobin: 12.1 g/dL — ABNORMAL LOW (ref 13.0–17.0)
MCH: 30.7 pg (ref 26.0–34.0)
MCHC: 32 g/dL (ref 30.0–36.0)
MCV: 95.9 fL (ref 78.0–100.0)
PLATELETS: 223 10*3/uL (ref 150–400)
RBC: 3.94 MIL/uL — ABNORMAL LOW (ref 4.22–5.81)
RDW: 13.2 % (ref 11.5–15.5)
WBC: 15 10*3/uL — ABNORMAL HIGH (ref 4.0–10.5)

## 2017-06-01 LAB — ECHOCARDIOGRAM COMPLETE
Height: 71 in
WEIGHTICAEL: 6128 [oz_av]

## 2017-06-01 LAB — MRSA PCR SCREENING: MRSA by PCR: POSITIVE — AB

## 2017-06-01 LAB — HIV ANTIBODY (ROUTINE TESTING W REFLEX): HIV Screen 4th Generation wRfx: NONREACTIVE

## 2017-06-01 LAB — TROPONIN I

## 2017-06-01 MED ORDER — MUPIROCIN 2 % EX OINT
1.0000 "application " | TOPICAL_OINTMENT | Freq: Two times a day (BID) | CUTANEOUS | Status: DC
  Administered 2017-06-01: 1 via NASAL
  Filled 2017-06-01: qty 22

## 2017-06-01 MED ORDER — HYDRALAZINE HCL 10 MG PO TABS
10.0000 mg | ORAL_TABLET | Freq: Four times a day (QID) | ORAL | Status: DC
  Administered 2017-06-01: 10 mg via ORAL
  Filled 2017-06-01: qty 1

## 2017-06-01 MED ORDER — DOXYCYCLINE HYCLATE 100 MG PO TABS: 100.0000 mg | ORAL_TABLET | Freq: Two times a day (BID) | ORAL | 0 refills | Status: AC

## 2017-06-01 MED ORDER — CHLORHEXIDINE GLUCONATE CLOTH 2 % EX PADS
6.0000 | MEDICATED_PAD | Freq: Every day | CUTANEOUS | Status: DC
  Administered 2017-06-01: 6 via TOPICAL

## 2017-06-01 MED ORDER — PERFLUTREN LIPID MICROSPHERE
1.0000 mL | INTRAVENOUS | Status: AC | PRN
  Administered 2017-06-01: 2 mL via INTRAVENOUS
  Filled 2017-06-01: qty 10

## 2017-06-01 NOTE — Progress Notes (Signed)
  Echocardiogram 2D Echocardiogram has been performed.  Zeeva Courser L Androw 06/01/2017, 10:32 AM

## 2017-06-01 NOTE — Progress Notes (Signed)
The patient has been given discharge instructions along with a new medication list and what to take today. He has a prescription to pick up and a follow up appointment to make. He is discharging with his wife via car.   Saddie Benders RN

## 2017-06-01 NOTE — Discharge Instructions (Signed)
°  Please complete a 7 day course of doxycycline for your hand infection. If you develop more redness, pain, or fevers please return to be seen.  Follow up with your PCP closely for other health issues.

## 2017-06-01 NOTE — Discharge Summary (Addendum)
[facility] Discharge Summary  Patient name: Jeffery Simon Medical record number: [id number] Date of birth: [birth year] Age: 52 y.o. Gender: male Date of Admission: 05/30/2017  Date of Discharge: 06/01/17 Admitting Physician: Dickie La, MD  Primary Care Provider: Greig Right, MD Consultants: none  Indication for Hospitalization: Hypoxia with chest pressure  Discharge Diagnoses/Problem List:  Chronic Venous Insufficiency Cellulitis of the R hand OSA Obesity  Disposition: home  Discharge Condition: stable  Discharge Exam:  General: well appearing, morbidly obese male in NAD Cardiovascular: RRR, no murmurs, rubs or gallops. Unable to appreciate JVD.      Respiratory: Lungs CTAB, slightly diminished breath sounds in bilateral lower lobes Abdomen: nontender to palpation, BS+ Extremities: puffy arms. Improving cellulitis, erythema within skin marker line from yesterday. Able to move fingers without pain. Sensation intact.  LEs swollen, +1 edema non pitting. Appropriately warm, dry.   Brief Hospital Course:  Mr. Unrein was admitted the hospital after being found to be hypoxic during an UC visit for hand cellulitis.  On further questioning, he noted increased leg swelling for several weeks and sensation of chest pressure.  ED workup was notable for negative troponins and normal EKG. He was given albuterol and corticosteroids treatment. He was admitted for CHF workup.  Echo was notable for grade 2 diastolic dysfunction, and in the setting of CXR without evidence of congestion and mostly clear lungs on admission exam, patient was not thought to be in heart failure.  Patient was found to have falsely low oxygenation via pulse oxymetry after ABG was normal and without signs of chronic hypoxia compensation.  The morning following admission, patient had net loss of 4L urine output and reported symptomatic improvement with complete resolution of his chest pressure and  significant reduction in his lower extremity swelling.  Patient was counseled on the importance of weight loss regarding his venous insuffiency, heart dysfunction and OSA.  He was discharged with a weight of 383lbs.   Regarding cellulitis: patient was given 1 dose clindamycin and then switched to doxycycline. Will complete a seven day course of doxycycline.  Counseled patient on strict return precautions and to follow-up with PCP regarding resolution of infection.    Issues for Follow Up:  1. Chronic Venous Insufficiency- LASIX compliance? Patient reported not taking this medication 2. Weight loss- bariatric surgery candidate?  3. Abnormal CXR findings/positive response to ICS, consider asthma vs pulmonology outpt workup - PFTs 4. Cellulitis resolution w/ doxycycline  Significant Procedures: none  Significant Labs and Imaging:  Recent Labs  Lab 05/30/17 2041 05/31/17 1531 06/01/17 0226  WBC 10.6* 10.4 15.0*  HGB 11.7* 12.4* 12.1*  HCT 37.0* 38.7* 37.8*  PLT 203 197 223   Recent Labs  Lab 05/30/17 2041 05/31/17 1531 06/01/17 0226  NA 141 136 139  K 4.0 4.0 4.3  CL 101 99* 100*  CO2 31 27 32  GLUCOSE 94 246* 139*  BUN 12 11 13   CREATININE 0.84 0.77 0.80  CALCIUM 8.7* 8.7* 8.7*   FIO2  21.00   pH, Arterial 7.350 - 7.450 7.478High    pCO2 arterial 32.0 - 48.0 mmHg 43.7   pO2, Arterial 83.0 - 108.0 mmHg 108   Bicarbonate 20.0 - 28.0 mmol/L 32.0High    Acid-Base Excess 0.0 - 2.0 mmol/L 8.1High    O2 Saturation % 98.9   Patient temperature  98.6   Collection site  RIGHT RADIAL   Drawn by  [id number]   Sample type  ARTERIAL DRAW  Allens test (pass/fail) PASS PASS    ECHO: Study Conclusions  - Left ventricle: The cavity size was normal. Wall thickness was   normal. Systolic function was normal. The estimated ejection   fraction was in the range of 55% to 60%. Definity contrast given,   no obvious wall motion abnormalities. Doppler parameters are   consistent with  pseudonormal left ventricular relaxation (grade 2   diastolic dysfunction). The E/A ratio is >1.5. The E/e&' ratio is   >15, suggesting elevated LV filling pressure. - Aortic valve: Sclerosis without stenosis. Transvalvular velocity   was minimally increased. - Mitral valve: Mildly thickened leaflets . There was trivial   regurgitation. - Left atrium: The atrium was normal in size. - Inferior vena cava: The vessel was dilated. The respirophasic   diameter changes were blunted (< 50%), consistent with elevated   central venous pressure.  Impressions:  - Technically difficult study. Definity contrast given . LVEF   55-60%, normal wall thickness, no obvious wall motion   abnormalities, grade 2 DD, elevated LV filling pressure, normal   LA size, dilated IVC, no significant TR - therefore RVSP could   not be estimated. ------------------------------------------------------------------- Left ventricle:  The cavity size was normal. Wall thickness was normal. Systolic function was normal. The estimated ejection fraction was in the range of 55% to 60%. Definity contrast given, no obvious wall motion abnormalities. Doppler parameters are consistent with pseudonormal left ventricular relaxation (grade 2 diastolic dysfunction). The E/A ratio is >1.5. The E/e&' ratio is >15, suggesting elevated LV filling pressure. ------------------------------------------------------------------- Aortic valve:  Sclerosis without stenosis.  Doppler:  Transvalvular velocity was minimally increased.    VTI ratio of LVOT to aortic valve: 0.87. Indexed valve area (VTI): 0.89 cm^2/m^2. Peak velocity ratio of LVOT to aortic valve: 0.88. Indexed valve area (Vmax): 0.91 cm^2/m^2. Mean velocity ratio of LVOT to aortic valve: 0.79. Indexed valve area (Vmean): 0.82 cm^2/m^2.    Mean gradient (S): 7 mm Hg. Peak gradient (S): 11 mm Hg. ------------------------------------------------------------------- Aorta:  Aortic  root: The aortic root was normal in size. Ascending aorta: The ascending aorta was normal in size. ------------------------------------------------------------------- Mitral valve:   Mildly thickened leaflets .  Doppler:  There was trivial regurgitation.    Peak gradient (D): 8 mm Hg. ------------------------------------------------------------------- Left atrium:  The atrium was normal in size. ------------------------------------------------------------------- Right ventricle:  The cavity size was normal. Wall thickness was normal. Systolic function was normal. ------------------------------------------------------------------- Pulmonic valve:   Poorly visualized.  Doppler:  There was no significant regurgitation. ------------------------------------------------------------------- Tricuspid valve:   Doppler:  There was no significant regurgitation. ------------------------------------------------------------------- Pulmonary artery:   Poorly visualized. ------------------------------------------------------------------- Right atrium:  The atrium was normal in size. ------------------------------------------------------------------- Pericardium:  There was no pericardial effusion. ------------------------------------------------------------------- Systemic veins: Inferior vena cava: The vessel was dilated. The respirophasic diameter changes were blunted (< 50%), consistent with elevated central venous pressure.   Results/Tests Pending at Time of Discharge: none  Discharge Medications:  Allergies as of 06/01/2017   No Known Allergies     Medication List    STOP taking these medications   oxyCODONE-acetaminophen 5-325 MG tablet Commonly known as:  PERCOCET/ROXICET     TAKE these medications   cetirizine 10 MG tablet Commonly known as:  ZYRTEC Take 10 mg by mouth as needed for allergies.   doxycycline 100 MG tablet Commonly known as:  VIBRA-TABS Take 1 tablet (100 mg  total) by mouth every 12 (twelve) hours for 6 days. What changed:  when to take this  fish oil-omega-3 fatty acids 1000 MG capsule Take 1 g by mouth daily.   furosemide 20 MG tablet Commonly known as:  LASIX Take 20 mg by mouth 2 (two) times daily.   GARLIC PO Take 1 tablet by mouth daily.   GLUCOSAMINE CHOND DOUBLE STR Tabs Take 1 tablet by mouth daily.   ibuprofen 200 MG tablet Commonly known as:  ADVIL,MOTRIN Take 400 mg by mouth every 6 (six) hours as needed for headache or moderate pain.   lisinopril 40 MG tablet Commonly known as:  PRINIVIL,ZESTRIL Take 40 mg by mouth daily.   multivitamin with minerals tablet Take 1 tablet by mouth daily.       Discharge Instructions: Please refer to Patient Instructions section of EMR for full details.  Patient was counseled important signs and symptoms that should prompt return to medical care, changes in medications, dietary instructions, activity restrictions, and follow up appointments.   Follow-Up Appointments: Follow-up Information    Greig Right, MD. Schedule an appointment as soon as possible for a visit.   Specialty:  Family Medicine Contact information: [place] [address] [phone]           RocheSallyanne Havers, Medical Student 06/01/2017, 3:25 PM MS4, [place] Upper-Level Resident Addendum  I have independently interviewed and examined the patient. Patient stable for discharge home. I have discussed the above with the original author and agree with their documentation. Please see also any attending notes.   Lucila Maine, DO PGY-2, [place] Service pager: [phone] (text pages welcome through [place])

## 2017-06-01 NOTE — Progress Notes (Signed)
Family Medicine Teaching Service Daily Progress Note Intern Pager: [phone]  Patient name: Jeffery Simon Medical record number: [id number] Date of birth: 1965/11/06 Age: 52 y.o. Gender: male  Primary Care Provider: Greig Right, MD Consultants: none Code Status: full  Pt Overview and Major Events to Date:  No interval events. Patient awaiting floor bed.   Assessment and Plan: Corpus is a 52yo M here for dyspnea on exertion, hypoxia and unrelated cellulitis infection.  Currently investigating ACS vs HF as etiology of dyspnea/hypoxia.    DOE  CP  Hypoxia: Pt received dose of aspirin on admission, however less concerned for ACS given neg trops, nl EKG x2.  Patient responding well to lasix, -2.5L since admission plus several uncharted occurences. Subjectively, he is reporting improvement. - EKG this AM negative for signs of acute ischemia. 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  Continued improvement today on PE.  Will continue doxycycline for 5-14 days. S/p clindamycin in ED x1 dose.  - Doxycycline 100mg  BID (5/7- )  HTN: Chronic.  BP's 277-824'M systolic overnight.  Home medications ordered. - Hold home Lisinopril while diuresing  - Hydralazine PRN for PNTIRWER>154, diastolic >008  OSA  Morbid Obesity: On nightly CPAP.    FEN/GI: heart healthy PPx: enoxaparin   Disposition: stable  Subjective:  Patient reports feeling less chest pressure this morning, more of slight chest congestion.  Feels like the swelling in his feet is decreased, and his shortness of breath has improved.  No HA, nausea, dizziness.  Says he urinated x6 yesterday before being transferred up to the unit.   Objective: Temp:  [96.6 F (35.9 C)-98.3 F (36.8 C)] 96.6 F  (35.9 C) (05/08 0551) Pulse Rate:  [66-104] 69 (05/08 0551) Resp:  [13-22] 14 (05/07 2315) BP: (121-163)/(64-100) 157/84 (05/08 0551) SpO2:  [88 %-100 %] 91 % (05/08 0551) Weight:  [383 lb (173.7 kg)-383 lb 11.2 oz (174 kg)] 383 lb (173.7 kg) (05/08 0328) Physical Exam: General: well appearing, morbidly obese male in NAD Cardiovascular: RRR, no murmurs, rubs or gallops. Unable to appreciate JVD.   Respiratory: Lungs CTAB, slightly diminished breath sounds in bilateral lower lobes Abdomen: nontender to palpation, BS+ Extremities: puffy arms. Improving cellulitis, erythema within skin marker line from yesterday. Able to move fingers without pain. Sensation intact.  LEs swollen, +1 edema non pitting. Appropriately warm, dry.   Laboratory: Recent Labs  Lab 05/30/17 2041 05/31/17 1531 06/01/17 0226  WBC 10.6* 10.4 15.0*  HGB 11.7* 12.4* 12.1*  HCT 37.0* 38.7* 37.8*  PLT 203 197 223   Recent Labs  Lab 05/30/17 2041 05/31/17 1531 06/01/17 0226  NA 141 136 139  K 4.0 4.0 4.3  CL 101 99* 100*  CO2 31 27 32  BUN 12 11 13   CREATININE 0.84 0.77 0.80  CALCIUM 8.7* 8.7* 8.7*  GLUCOSE 94 246* 139*     Imaging/Diagnostic Tests: EKG: Sinus rhythm ECHO: pending  Roche, Heidi A, Medical Student 06/01/2017, 7:33 AM MS4, [place] Intern pager: [phone], text pages welcome

## 2017-08-05 DIAGNOSIS — Z6841 Body Mass Index (BMI) 40.0 and over, adult: Secondary | ICD-10-CM | POA: Diagnosis not present

## 2017-08-05 DIAGNOSIS — I10 Essential (primary) hypertension: Secondary | ICD-10-CM | POA: Diagnosis not present

## 2017-09-01 DIAGNOSIS — R600 Localized edema: Secondary | ICD-10-CM | POA: Diagnosis not present

## 2017-09-01 DIAGNOSIS — I83893 Varicose veins of bilateral lower extremities with other complications: Secondary | ICD-10-CM | POA: Diagnosis not present

## 2017-09-01 DIAGNOSIS — I10 Essential (primary) hypertension: Secondary | ICD-10-CM | POA: Diagnosis not present

## 2017-12-08 DIAGNOSIS — R600 Localized edema: Secondary | ICD-10-CM | POA: Diagnosis not present

## 2017-12-08 DIAGNOSIS — Z6841 Body Mass Index (BMI) 40.0 and over, adult: Secondary | ICD-10-CM | POA: Diagnosis not present

## 2017-12-08 DIAGNOSIS — I10 Essential (primary) hypertension: Secondary | ICD-10-CM | POA: Diagnosis not present

## 2018-03-10 DIAGNOSIS — R5381 Other malaise: Secondary | ICD-10-CM | POA: Diagnosis not present

## 2018-03-10 DIAGNOSIS — I10 Essential (primary) hypertension: Secondary | ICD-10-CM | POA: Diagnosis not present

## 2018-03-10 DIAGNOSIS — Z6841 Body Mass Index (BMI) 40.0 and over, adult: Secondary | ICD-10-CM | POA: Diagnosis not present

## 2018-03-24 DIAGNOSIS — M62830 Muscle spasm of back: Secondary | ICD-10-CM | POA: Diagnosis not present

## 2018-03-24 DIAGNOSIS — M5416 Radiculopathy, lumbar region: Secondary | ICD-10-CM | POA: Diagnosis not present

## 2018-03-24 DIAGNOSIS — M9903 Segmental and somatic dysfunction of lumbar region: Secondary | ICD-10-CM | POA: Diagnosis not present

## 2018-03-24 DIAGNOSIS — M9905 Segmental and somatic dysfunction of pelvic region: Secondary | ICD-10-CM | POA: Diagnosis not present

## 2018-04-05 DIAGNOSIS — Z01818 Encounter for other preprocedural examination: Secondary | ICD-10-CM | POA: Diagnosis not present

## 2018-05-22 DIAGNOSIS — Z6841 Body Mass Index (BMI) 40.0 and over, adult: Secondary | ICD-10-CM | POA: Diagnosis not present

## 2018-05-22 DIAGNOSIS — E291 Testicular hypofunction: Secondary | ICD-10-CM | POA: Diagnosis not present

## 2018-05-22 DIAGNOSIS — M545 Low back pain: Secondary | ICD-10-CM | POA: Diagnosis not present

## 2018-05-25 DIAGNOSIS — E291 Testicular hypofunction: Secondary | ICD-10-CM | POA: Diagnosis not present

## 2018-05-29 DIAGNOSIS — M545 Low back pain: Secondary | ICD-10-CM | POA: Diagnosis not present

## 2018-05-29 DIAGNOSIS — M5136 Other intervertebral disc degeneration, lumbar region: Secondary | ICD-10-CM | POA: Diagnosis not present

## 2018-06-13 DIAGNOSIS — I10 Essential (primary) hypertension: Secondary | ICD-10-CM | POA: Diagnosis not present

## 2018-06-13 DIAGNOSIS — Z79899 Other long term (current) drug therapy: Secondary | ICD-10-CM | POA: Diagnosis not present

## 2018-06-13 DIAGNOSIS — Z1211 Encounter for screening for malignant neoplasm of colon: Secondary | ICD-10-CM | POA: Diagnosis not present

## 2018-07-03 DIAGNOSIS — M5187 Other intervertebral disc disorders, lumbosacral region: Secondary | ICD-10-CM | POA: Diagnosis not present

## 2018-07-03 DIAGNOSIS — I10 Essential (primary) hypertension: Secondary | ICD-10-CM | POA: Diagnosis not present

## 2018-07-05 DIAGNOSIS — M545 Low back pain: Secondary | ICD-10-CM | POA: Diagnosis not present

## 2018-07-05 DIAGNOSIS — M5187 Other intervertebral disc disorders, lumbosacral region: Secondary | ICD-10-CM | POA: Diagnosis not present

## 2018-07-10 DIAGNOSIS — M5416 Radiculopathy, lumbar region: Secondary | ICD-10-CM | POA: Diagnosis not present

## 2018-07-11 DIAGNOSIS — M5416 Radiculopathy, lumbar region: Secondary | ICD-10-CM | POA: Diagnosis not present

## 2018-07-14 DIAGNOSIS — M5416 Radiculopathy, lumbar region: Secondary | ICD-10-CM | POA: Diagnosis not present

## 2018-07-18 ENCOUNTER — Other Ambulatory Visit: Payer: Self-pay | Admitting: Orthopedic Surgery

## 2018-07-18 DIAGNOSIS — M5416 Radiculopathy, lumbar region: Secondary | ICD-10-CM

## 2018-07-25 DIAGNOSIS — M5416 Radiculopathy, lumbar region: Secondary | ICD-10-CM | POA: Diagnosis not present

## 2018-07-31 DIAGNOSIS — M5416 Radiculopathy, lumbar region: Secondary | ICD-10-CM | POA: Diagnosis not present

## 2018-08-01 ENCOUNTER — Other Ambulatory Visit: Payer: Self-pay

## 2018-08-01 ENCOUNTER — Ambulatory Visit
Admission: RE | Admit: 2018-08-01 | Discharge: 2018-08-01 | Disposition: A | Discharge status: Home / Self Care | Payer: BC Managed Care – PPO | Source: Ambulatory Visit | Attending: Orthopedic Surgery | Admitting: Orthopedic Surgery

## 2018-08-01 DIAGNOSIS — M4727 Other spondylosis with radiculopathy, lumbosacral region: Secondary | ICD-10-CM | POA: Diagnosis not present

## 2018-08-01 DIAGNOSIS — M5416 Radiculopathy, lumbar region: Secondary | ICD-10-CM

## 2018-08-01 DIAGNOSIS — M545 Low back pain: Secondary | ICD-10-CM | POA: Diagnosis not present

## 2018-08-01 MED ORDER — IOPAMIDOL (ISOVUE-M 200) INJECTION 41%
1.0000 mL | Freq: Once | INTRAMUSCULAR | Status: AC
  Administered 2018-08-01: 1 mL via EPIDURAL

## 2018-08-01 MED ORDER — METHYLPREDNISOLONE ACETATE 40 MG/ML INJ SUSP (RADIOLOG
120.0000 mg | Freq: Once | INTRAMUSCULAR | Status: AC
  Administered 2018-08-01: 10:00:00 120 mg via EPIDURAL

## 2018-08-01 NOTE — Discharge Instructions (Signed)

## 2018-08-07 DIAGNOSIS — M5416 Radiculopathy, lumbar region: Secondary | ICD-10-CM | POA: Diagnosis not present

## 2018-08-09 DIAGNOSIS — M5416 Radiculopathy, lumbar region: Secondary | ICD-10-CM | POA: Diagnosis not present

## 2018-08-14 DIAGNOSIS — M5416 Radiculopathy, lumbar region: Secondary | ICD-10-CM | POA: Diagnosis not present

## 2018-08-22 ENCOUNTER — Other Ambulatory Visit: Payer: Self-pay | Admitting: Orthopedic Surgery

## 2018-08-22 DIAGNOSIS — M5416 Radiculopathy, lumbar region: Secondary | ICD-10-CM

## 2018-08-30 ENCOUNTER — Other Ambulatory Visit: Payer: Self-pay

## 2018-08-30 ENCOUNTER — Ambulatory Visit
Admission: RE | Admit: 2018-08-30 | Discharge: 2018-08-30 | Disposition: A | Discharge status: Home / Self Care | Payer: BC Managed Care – PPO | Source: Ambulatory Visit | Attending: Orthopedic Surgery | Admitting: Orthopedic Surgery

## 2018-08-30 DIAGNOSIS — M5416 Radiculopathy, lumbar region: Secondary | ICD-10-CM

## 2018-08-30 DIAGNOSIS — M4726 Other spondylosis with radiculopathy, lumbar region: Secondary | ICD-10-CM | POA: Diagnosis not present

## 2018-08-30 MED ORDER — IOPAMIDOL (ISOVUE-M 200) INJECTION 41%: 1.0000 mL | Freq: Once | INTRAMUSCULAR | Status: DC

## 2018-08-30 MED ORDER — METHYLPREDNISOLONE ACETATE 40 MG/ML INJ SUSP (RADIOLOG
120.0000 mg | Freq: Once | INTRAMUSCULAR | Status: AC
  Administered 2018-08-30: 120 mg via INTRA_ARTICULAR

## 2019-06-09 ENCOUNTER — Encounter (HOSPITAL_COMMUNITY): Payer: Self-pay | Admitting: Emergency Medicine

## 2019-06-09 ENCOUNTER — Inpatient Hospital Stay (HOSPITAL_COMMUNITY)
Admission: EM | Admit: 2019-06-09 | Discharge: 2019-06-16 | DRG: 177 | Disposition: A | Discharge status: Home / Self Care | Payer: PRIVATE HEALTH INSURANCE | Attending: Internal Medicine | Admitting: Internal Medicine

## 2019-06-09 ENCOUNTER — Emergency Department (HOSPITAL_COMMUNITY): Payer: PRIVATE HEALTH INSURANCE

## 2019-06-09 DIAGNOSIS — I110 Hypertensive heart disease with heart failure: Secondary | ICD-10-CM | POA: Diagnosis present

## 2019-06-09 DIAGNOSIS — Z791 Long term (current) use of non-steroidal anti-inflammatories (NSAID): Secondary | ICD-10-CM

## 2019-06-09 DIAGNOSIS — R0602 Shortness of breath: Secondary | ICD-10-CM

## 2019-06-09 DIAGNOSIS — Z6841 Body Mass Index (BMI) 40.0 and over, adult: Secondary | ICD-10-CM

## 2019-06-09 DIAGNOSIS — E8770 Fluid overload, unspecified: Secondary | ICD-10-CM

## 2019-06-09 DIAGNOSIS — J1282 Pneumonia due to coronavirus disease 2019: Secondary | ICD-10-CM | POA: Diagnosis present

## 2019-06-09 DIAGNOSIS — Z8249 Family history of ischemic heart disease and other diseases of the circulatory system: Secondary | ICD-10-CM

## 2019-06-09 DIAGNOSIS — U071 COVID-19: Secondary | ICD-10-CM

## 2019-06-09 DIAGNOSIS — I5033 Acute on chronic diastolic (congestive) heart failure: Secondary | ICD-10-CM | POA: Diagnosis present

## 2019-06-09 DIAGNOSIS — Z79899 Other long term (current) drug therapy: Secondary | ICD-10-CM

## 2019-06-09 DIAGNOSIS — J9601 Acute respiratory failure with hypoxia: Secondary | ICD-10-CM | POA: Diagnosis present

## 2019-06-09 DIAGNOSIS — R0902 Hypoxemia: Secondary | ICD-10-CM

## 2019-06-09 DIAGNOSIS — E662 Morbid (severe) obesity with alveolar hypoventilation: Secondary | ICD-10-CM | POA: Diagnosis present

## 2019-06-09 HISTORY — DX: Acute on chronic diastolic (congestive) heart failure: I50.33

## 2019-06-09 HISTORY — DX: Acute respiratory failure with hypoxia: J96.01

## 2019-06-09 HISTORY — DX: COVID-19: U07.1

## 2019-06-09 LAB — BASIC METABOLIC PANEL
Anion gap: 9 (ref 5–15)
BUN: 17 mg/dL (ref 6–20)
CO2: 34 mmol/L — ABNORMAL HIGH (ref 22–32)
Calcium: 8.4 mg/dL — ABNORMAL LOW (ref 8.9–10.3)
Chloride: 100 mmol/L (ref 98–111)
Creatinine, Ser: 0.92 mg/dL (ref 0.61–1.24)
GFR calc Af Amer: >60 mL/min (ref >60)
GFR calc non Af Amer: >60 mL/min (ref >60)
Glucose, Bld: 104 mg/dL — ABNORMAL HIGH (ref 70–99)
Potassium: 4.1 mmol/L (ref 3.5–5.1)
Sodium: 143 mmol/L (ref 135–145)

## 2019-06-09 LAB — HIV ANTIBODY (ROUTINE TESTING W REFLEX): HIV Screen 4th Generation wRfx: NONREACTIVE

## 2019-06-09 LAB — ABO/RH: ABO/RH(D): A POS

## 2019-06-09 LAB — CBC
HCT: 44.5 % (ref 39.0–52.0)
Hemoglobin: 12.9 g/dL — ABNORMAL LOW (ref 13.0–17.0)
MCH: 28.7 pg (ref 26.0–34.0)
MCHC: 29 g/dL — ABNORMAL LOW (ref 30.0–36.0)
MCV: 98.9 fL (ref 80.0–100.0)
Platelets: 216 10*3/uL (ref 150–400)
RBC: 4.5 MIL/uL (ref 4.22–5.81)
RDW: 16.8 % — ABNORMAL HIGH (ref 11.5–15.5)
WBC: 9.3 10*3/uL (ref 4.0–10.5)
nRBC: 0 % (ref 0.0–0.2)

## 2019-06-09 LAB — SARS CORONAVIRUS 2 BY RT PCR (HOSPITAL ORDER, PERFORMED IN ~~LOC~~ HOSPITAL LAB): SARS Coronavirus 2: POSITIVE — AB

## 2019-06-09 LAB — PROCALCITONIN: Procalcitonin: <0.1 ng/mL

## 2019-06-09 LAB — C-REACTIVE PROTEIN: CRP: 2.2 mg/dL — ABNORMAL HIGH (ref <1.0)

## 2019-06-09 LAB — BRAIN NATRIURETIC PEPTIDE: B Natriuretic Peptide: 247.5 pg/mL — ABNORMAL HIGH (ref 0.0–100.0)

## 2019-06-09 LAB — D-DIMER, QUANTITATIVE: D-Dimer, Quant: 3.69 ug/mL-FEU — ABNORMAL HIGH (ref 0.00–0.50)

## 2019-06-09 LAB — TROPONIN I (HIGH SENSITIVITY)
Troponin I (High Sensitivity): 27 ng/L — ABNORMAL HIGH (ref <18)
Troponin I (High Sensitivity): 27 ng/L — ABNORMAL HIGH (ref <18)

## 2019-06-09 MED ORDER — MULTI-VITAMIN/MINERALS PO TABS: 1.0000 | ORAL_TABLET | Freq: Every day | ORAL | Status: DC

## 2019-06-09 MED ORDER — SPIRONOLACTONE 25 MG PO TABS
25.0000 mg | ORAL_TABLET | Freq: Every day | ORAL | Status: DC
  Administered 2019-06-10 – 2019-06-16 (×7): 25 mg via ORAL
  Filled 2019-06-09 (×7): qty 1

## 2019-06-09 MED ORDER — ALBUTEROL SULFATE (2.5 MG/3ML) 0.083% IN NEBU: 2.5000 mg | INHALATION_SOLUTION | Freq: Four times a day (QID) | RESPIRATORY_TRACT | Status: DC | PRN

## 2019-06-09 MED ORDER — FUROSEMIDE 10 MG/ML IJ SOLN
40.0000 mg | Freq: Every day | INTRAMUSCULAR | Status: DC
  Administered 2019-06-09 – 2019-06-10 (×2): 40 mg via INTRAVENOUS
  Filled 2019-06-09 (×2): qty 4

## 2019-06-09 MED ORDER — ACETAMINOPHEN 325 MG PO TABS: 650.0000 mg | ORAL_TABLET | Freq: Four times a day (QID) | ORAL | Status: DC | PRN

## 2019-06-09 MED ORDER — FISH OIL 1200 MG PO CAPS: 1200.0000 mg | ORAL_CAPSULE | Freq: Every day | ORAL | Status: DC

## 2019-06-09 MED ORDER — SODIUM CHLORIDE 0.9% FLUSH: 3.0000 mL | Freq: Once | INTRAVENOUS | Status: DC

## 2019-06-09 MED ORDER — ADULT MULTIVITAMIN W/MINERALS CH
1.0000 | ORAL_TABLET | Freq: Every day | ORAL | Status: DC
  Administered 2019-06-10 – 2019-06-16 (×7): 1 via ORAL
  Filled 2019-06-09 (×7): qty 1

## 2019-06-09 MED ORDER — FUROSEMIDE 10 MG/ML IJ SOLN
60.0000 mg | Freq: Once | INTRAMUSCULAR | Status: AC
  Administered 2019-06-09: 60 mg via INTRAVENOUS
  Filled 2019-06-09: qty 6

## 2019-06-09 MED ORDER — FLUTICASONE PROPIONATE 50 MCG/ACT NA SUSP
1.0000 | Freq: Every day | NASAL | Status: DC
  Administered 2019-06-10 – 2019-06-16 (×3): 1 via NASAL
  Filled 2019-06-09 (×2): qty 16

## 2019-06-09 MED ORDER — LISINOPRIL 20 MG PO TABS: 40.0000 mg | ORAL_TABLET | Freq: Every day | ORAL | Status: DC

## 2019-06-09 MED ORDER — ONDANSETRON HCL 4 MG PO TABS: 4.0000 mg | ORAL_TABLET | Freq: Four times a day (QID) | ORAL | Status: DC | PRN

## 2019-06-09 MED ORDER — OMEGA-3-ACID ETHYL ESTERS 1 G PO CAPS
1.0000 g | ORAL_CAPSULE | Freq: Every day | ORAL | Status: DC
  Administered 2019-06-10 – 2019-06-16 (×7): 1 g via ORAL
  Filled 2019-06-09 (×7): qty 1

## 2019-06-09 MED ORDER — ENOXAPARIN SODIUM 40 MG/0.4ML ~~LOC~~ SOLN
40.0000 mg | SUBCUTANEOUS | Status: DC
  Administered 2019-06-09: 40 mg via SUBCUTANEOUS
  Filled 2019-06-09: qty 0.4

## 2019-06-09 MED ORDER — SODIUM CHLORIDE 0.9 % IV SOLN
100.0000 mg | Freq: Every day | INTRAVENOUS | Status: AC
  Administered 2019-06-10 – 2019-06-13 (×4): 100 mg via INTRAVENOUS
  Filled 2019-06-09 (×4): qty 20

## 2019-06-09 MED ORDER — GLUCOSAMINE CHOND DOUBLE STR PO TABS: 1.0000 | ORAL_TABLET | Freq: Every day | ORAL | Status: DC

## 2019-06-09 MED ORDER — ONDANSETRON HCL 4 MG/2ML IJ SOLN: 4.0000 mg | Freq: Four times a day (QID) | INTRAMUSCULAR | Status: DC | PRN

## 2019-06-09 MED ORDER — SODIUM CHLORIDE 0.9 % IV SOLN
200.0000 mg | Freq: Once | INTRAVENOUS | Status: AC
  Administered 2019-06-09: 200 mg via INTRAVENOUS
  Filled 2019-06-09: qty 40

## 2019-06-09 MED ORDER — IOHEXOL 350 MG/ML SOLN
100.0000 mL | Freq: Once | INTRAVENOUS | Status: AC | PRN
  Administered 2019-06-09: 100 mL via INTRAVENOUS

## 2019-06-09 NOTE — ED Notes (Signed)
+  tele Eann Atkinson wife home [phone] cell [phone] looking for an update on pt

## 2019-06-09 NOTE — ED Notes (Signed)
Pt transported to CT ?

## 2019-06-09 NOTE — ED Notes (Signed)
Patient transported to X-ray, notified transport to bring pt to room after.

## 2019-06-09 NOTE — ED Notes (Signed)
This nurse attempted IV access x 2- unable to obtain. IV team consult placed

## 2019-06-09 NOTE — ED Notes (Signed)
Patient placed on 5L O2, sats improved to 99% and cyanosis resolved at this time.

## 2019-06-09 NOTE — ED Provider Notes (Signed)
[facility] EMERGENCY DEPARTMENT Provider Note   CSN: [id number] Arrival date & time: 06/09/19  1400     History Chief Complaint  Patient presents with  . Leg Swelling  . hypoxia    Jeffery Simon is a 54 y.o. male.  Presents to ER with chief complaint shortness of breath, leg swelling.  Patient states for last few months he is noted increasing lower leg swelling, however the past 2 weeks seems to have gotten a lot worse.  Currently on Lasix 20 p.o. per PCP.  Had episode of fluid overload 2019 but had been doing well ever since up until the last couple months.  States breathing worse with exertion, worse with lying flat, improved with rest.  No associated chest pain, no leg pain.  Swelling now has progressed to his lower abdomen.  Additional history obtained via chart review, reviewed DC summary from 2019, echo from 2019.  Wife had Covid 1.5 months ago, but was not tested at the time, did not have specific symptoms at the time.  HPI     Past Medical History:  Diagnosis Date  . Arthritis    feet and hands  . HTN (hypertension)   . Obesity   . Sleep apnea    cpap    Patient Active Problem List   Diagnosis Date Noted  . Acute respiratory failure with hypoxia ([place]) 06/09/2019  . Acute on chronic diastolic CHF (congestive heart failure) ([place]) 06/09/2019  . COVID-19 virus infection 06/09/2019  . Acute pulmonary edema (HCC)   . Chest pain 05/31/2017  . Hypoxia   . Cellulitis of right hand   . Bunionette of right foot 03/02/2017  . Cellulitis and abscess of left leg 04/25/2016  . Left leg cellulitis 04/24/2016  . Primary hypertension 04/24/2016  . Morbid obesity with BMI of 50.0-59.9, adult ([place]) 04/24/2016  . Transaminitis 04/24/2016  . Varicose veins of bilateral lower extremities with other complications 0000000  . OSA (obstructive sleep apnea) 05/03/2012    Past Surgical History:  Procedure Laterality Date  . BUNIONECTOMY     03-02-17 Dr.  Berenice Primas  . BUNIONECTOMY Right 03/02/2017   Procedure: RIGHT 5TH BUNIONETTE EXCISION;  Surgeon: Dorna Leitz, MD;  Location: [facility];  Service: Orthopedics;  Laterality: Right;  GRAVES NEED 1HR FOR PROCEDURE  . ENDOVENOUS ABLATION SAPHENOUS VEIN W/ LASER Right 04/06/2016   endovenous laser ablation right greater saphenous vein by Tinnie Gens MD   . I & D EXTREMITY Left 04/25/2016   Procedure: IRRIGATION AND DEBRIDEMENT EXTREMITY;  Surgeon: Rod Can, MD;  Location: [facility];  Service: Orthopedics;  Laterality: Left;       Family History  Problem Relation Age of Onset  . Hypertension Mother   . Hypertension Father     Social History   Tobacco Use  . Smoking status: Never Smoker  . Smokeless tobacco: Never Used  . Tobacco comment: cigars occ  Substance Use Topics  . Alcohol use: No  . Drug use: No    Home Medications Prior to Admission medications   Medication Sig Start Date End Date Taking? Authorizing Provider  albuterol (PROVENTIL) (2.5 MG/3ML) 0.083% nebulizer solution Take 2.5 mg by nebulization every 6 (six) hours as needed for wheezing or shortness of breath.   Yes [provider]  diclofenac (VOLTAREN) 75 MG EC tablet Take 75 mg by mouth 2 (two) times daily.   Yes [provider]  fluticasone (FLONASE) 50 MCG/ACT nasal spray Place 1 spray into both  nostrils daily.   Yes [provider]  furosemide (LASIX) 20 MG tablet Take 20 mg by mouth 2 (two) times daily.   Yes [provider]  Garlic [id number] MG TBEC Take 2,000 mg by mouth daily.   Yes [provider]  ibuprofen (ADVIL,MOTRIN) 200 MG tablet Take 400 mg by mouth every 6 (six) hours as needed for headache or moderate pain.   Yes [provider]  lisinopril (PRINIVIL,ZESTRIL) 40 MG tablet Take 40 mg by mouth daily.   Yes [provider]  Misc Natural Products (GLUCOSAMINE CHOND DOUBLE STR) TABS Take 1 tablet by mouth daily.   Yes [provider]  Multiple  Vitamins-Minerals (MULTIVITAMIN WITH MINERALS) tablet Take 1 tablet by mouth daily.   Yes [provider]  Omega-3 Fatty Acids (FISH OIL) 1200 MG CAPS Take 1,200 mg by mouth daily.   Yes [provider]  spironolactone (ALDACTONE) 25 MG tablet Take 25 mg by mouth daily.   Yes [provider]    Allergies    Patient has no known allergies.  Review of Systems   Review of Systems  Constitutional: Negative for chills and fever.  HENT: Negative for ear pain and sore throat.   Eyes: Negative for pain and visual disturbance.  Respiratory: Positive for shortness of breath. Negative for cough.   Cardiovascular: Negative for chest pain and palpitations.  Gastrointestinal: Negative for abdominal pain and vomiting.  Genitourinary: Negative for dysuria and hematuria.  Musculoskeletal: Negative for arthralgias and back pain.  Skin: Negative for color change and rash.  Neurological: Negative for seizures and syncope.  All other systems reviewed and are negative.   Physical Exam Updated Vital Signs BP (!) 157/83   Pulse 95   Temp 98.3 F (36.8 C) (Oral)   Resp (!) 22   Ht 5\' 11"  (1.803 m)   Wt (!) 176.9 kg   SpO2 91%   BMI 54.39 kg/m   Physical Exam Vitals and nursing note reviewed.  Constitutional:      Appearance: He is well-developed.  HENT:     Head: Normocephalic and atraumatic.  Eyes:     Conjunctiva/sclera: Conjunctivae normal.  Cardiovascular:     Rate and Rhythm: Normal rate and regular rhythm.     Heart sounds: No murmur.  Pulmonary:     Effort: No respiratory distress.     Comments: Mild crackles noted, no respiratory distress Abdominal:     Palpations: Abdomen is soft.     Tenderness: There is no abdominal tenderness.     Comments: There is pitting edema in the lower abdomen  Musculoskeletal:     Cervical back: Neck supple.     Comments: Bilateral lower extremity edema, pitting, worse in lower legs but extends through thighs, equal  bilaterally  Skin:    General: Skin is warm and dry.  Neurological:     General: No focal deficit present.     Mental Status: He is alert and oriented to person, place, and time.     ED Results / Procedures / Treatments   Labs (all labs ordered are listed, but only abnormal results are displayed) Labs Reviewed  SARS CORONAVIRUS 2 BY RT PCR (HOSPITAL ORDER, [facility]) - Abnormal; Notable for the following components:      Result Value   SARS Coronavirus 2 POSITIVE (*)    All other components within normal limits  BASIC METABOLIC PANEL - Abnormal; Notable for the following components:   CO2  34 (*)    Glucose, Bld 104 (*)    Calcium 8.4 (*)    All other components within normal limits  CBC - Abnormal; Notable for the following components:   Hemoglobin 12.9 (*)    MCHC 29.0 (*)    RDW 16.8 (*)    All other components within normal limits  BRAIN NATRIURETIC PEPTIDE - Abnormal; Notable for the following components:   B Natriuretic Peptide 247.5 (*)    All other components within normal limits  D-DIMER, QUANTITATIVE (NOT AT [facility]) - Abnormal; Notable for the following components:   D-Dimer, Quant 3.69 (*)    All other components within normal limits  C-REACTIVE PROTEIN - Abnormal; Notable for the following components:   CRP 2.2 (*)    All other components within normal limits  TROPONIN I (HIGH SENSITIVITY) - Abnormal; Notable for the following components:   Troponin I (High Sensitivity) 27 (*)    All other components within normal limits  TROPONIN I (HIGH SENSITIVITY) - Abnormal; Notable for the following components:   Troponin I (High Sensitivity) 27 (*)    All other components within normal limits  HIV ANTIBODY (ROUTINE TESTING W REFLEX)  PROCALCITONIN  C-REACTIVE PROTEIN  D-DIMER, QUANTITATIVE (NOT AT [facility])  COMPREHENSIVE METABOLIC PANEL  CBC WITH DIFFERENTIAL/PLATELET  ABO/RH    EKG EKG Interpretation  Date/Time:  Saturday Jun 09 2019  14:02:41 EDT Ventricular Rate:  99 PR Interval:  172 QRS Duration: 84 QT Interval:  346 QTC Calculation: 444 R Axis:   52 Text Interpretation: Normal sinus rhythm Normal ECG When compared to prior, faster rate. no sTEMI Confirmed by Antony Blackbird [id number]) on 06/09/2019 2:24:51 PM   Radiology DG Chest 2 View  Result Date: 06/09/2019 CLINICAL DATA:  Shortness of breath for 1 week with lower extremity swelling, initial encounter EXAM: CHEST - 2 VIEW COMPARISON:  05/30/2017 FINDINGS: Cardiac shadow is mildly prominent. Vascular congestion is noted with mild pulmonary edema consistent with CHF. Small right-sided pleural effusion is noted. Some right basilar atelectasis/early infiltrate is also noted. No bony abnormality is noted. IMPRESSION: Changes of CHF with right basilar infiltrate/atelectasis. Electronically Signed   By: Inez Catalina M.D.   On: 06/09/2019 14:50   CT Angio Chest PE W and/or Wo Contrast  Result Date: 06/09/2019 CLINICAL DATA:  History of COVID-19 positivity with chest pain and lower extremity swelling EXAM: CT ANGIOGRAPHY CHEST WITH CONTRAST TECHNIQUE: Multidetector CT imaging of the chest was performed using the standard protocol during bolus administration of intravenous contrast. Multiplanar CT image reconstructions and MIPs were obtained to evaluate the vascular anatomy. CONTRAST:  158mL OMNIPAQUE IOHEXOL 350 MG/ML SOLN COMPARISON:  None. FINDINGS: Cardiovascular: Thoracic aorta shows no aneurysmal dilatation. No cardiac enlargement is seen. Pulmonary artery opacification is somewhat limited due to timing of the contrast bolus although no pulmonary embolus is noted. Mediastinum/Nodes: Thoracic inlet is within normal limits. Scattered small hilar and mediastinal lymph nodes are noted likely reactive in nature. The esophagus is within normal limits as visualized. Lungs/Pleura: Moderate right-sided pleural effusion is noted. Mild interstitial edema is seen. Patchy infiltrate is noted  in the bases bilaterally. This may be related to the known COVID-19 infection. No sizable parenchymal nodules are noted. Upper Abdomen: Visualized upper abdomen is within normal limits. Musculoskeletal: Degenerative changes of the thoracic spine are seen. Review of the MIP images confirms the above findings. IMPRESSION: Right-sided pleural effusion and bibasilar patchy opacities which may be related to the underlying clinical history. Mild pulmonary edema. Hilar  and mediastinal lymph nodes likely reactive in nature but not significant by size criteria. No evidence of pulmonary embolus. Electronically Signed   By: Inez Catalina M.D.   On: 06/09/2019 17:54    Procedures .Critical Care Performed by: Lucrezia Starch, MD Authorized by: Lucrezia Starch, MD   Critical care provider statement:    Critical care time (minutes):  45   Critical care was necessary to treat or prevent imminent or life-threatening deterioration of the following conditions:  Respiratory failure   Critical care was time spent personally by me on the following activities:  Discussions with consultants, evaluation of patient's response to treatment, examination of patient, ordering and performing treatments and interventions, ordering and review of laboratory studies, ordering and review of radiographic studies, pulse oximetry, re-evaluation of patient's condition, obtaining history from patient or surrogate and review of old charts   (including critical care time)  Medications Ordered in ED Medications  sodium chloride flush (NS) 0.9 % injection 3 mL (has no administration in time range)  furosemide (LASIX) injection 40 mg (40 mg Intravenous Given 06/09/19 2000)  remdesivir 200 mg in sodium chloride 0.9% 250 mL IVPB (0 mg Intravenous Stopped 06/09/19 2120)    Followed by  remdesivir 100 mg in sodium chloride 0.9 % 100 mL IVPB (has no administration in time range)  enoxaparin (LOVENOX) injection 40 mg (40 mg Subcutaneous Given  06/09/19 2021)  acetaminophen (TYLENOL) tablet 650 mg (has no administration in time range)  ondansetron (ZOFRAN) tablet 4 mg (has no administration in time range)    Or  ondansetron (ZOFRAN) injection 4 mg (has no administration in time range)  lisinopril (ZESTRIL) tablet 40 mg (has no administration in time range)  spironolactone (ALDACTONE) tablet 25 mg (has no administration in time range)  albuterol (PROVENTIL) (2.5 MG/3ML) 0.083% nebulizer solution 2.5 mg (has no administration in time range)  fluticasone (FLONASE) 50 MCG/ACT nasal spray 1 spray (has no administration in time range)  multivitamin with minerals tablet 1 tablet (has no administration in time range)  omega-3 acid ethyl esters (LOVAZA) capsule 1 g (has no administration in time range)  furosemide (LASIX) injection 60 mg (60 mg Intravenous Given 06/09/19 1720)  iohexol (OMNIPAQUE) 350 MG/ML injection 100 mL (100 mLs Intravenous Contrast Given 06/09/19 1727)    ED Course  I have reviewed the triage vital signs and the nursing notes.  Pertinent labs & imaging results that were available during my care of the patient were reviewed by me and considered in my medical decision making (see chart for details).    MDM Rules/Calculators/A&P                      54 year old male who presented to ER with concern for shortness of breath, leg swelling.  On exam patient initially noted to have hypoxia to 67% on room air, did well on supplemental oxygen, no respiratory distress.  Noted to have significant pitting edema to lower abdomen.  Suspect heart failure, fluid overload state.  Dimer was elevated and CTA chest ordered to rule out PE, this was negative.  Patient also found to be Covid positive, no fever, no new cough, wife had Covid a little over a month ago, suspect this may be an incidental finding.  Nevertheless, placed on precautions, started Lasix and admitted to hospitalist for further management.   Alcario Drought accepting.   Final  Clinical Impression(s) / ED Diagnoses Final diagnoses:  Shortness of breath  Acute respiratory failure with  hypoxia (HCC)  Hypervolemia, unspecified hypervolemia type  COVID-19    Rx / DC Orders ED Discharge Orders    None       Lucrezia Starch, MD 06/10/19 [phone]

## 2019-06-09 NOTE — ED Notes (Signed)
Date and time results received: 06/09/19  Test: covid Critical Value: positive  Name of Provider Notified: Roslynn Amble

## 2019-06-09 NOTE — ED Triage Notes (Addendum)
Pt reports ongoing swelling to lower legs, states his pcp put him on 20mg  lasix bid, reports that swelling has progressed over the past week and now has swelling to upper legs and groin, spoke with pcp, supposed to see her Monday but was advised to come in for further eval if symptoms got worse. Pt also endorses sob, O2 sat 67% in triage, resp mildly labored, pt a/ox4. Lips and fingernail beds cyanotic. Pt talking and in nad.

## 2019-06-09 NOTE — H&P (Signed)
History and Physical    Jeffery Simon [id number] DOB: 02-04-1965 DOA: 06/09/2019  PCP: Greig Right, MD  Patient coming from: Home  I have personally briefly reviewed patient's old medical records in [place]  Chief Complaint: SOB  HPI: Jeffery Simon is a 54 y.o. male with medical history significant of HTN, obesity, HFpEF grade 2 diastolic dysfunction in [id number].  Pt stable on 20mg  lasix at home for past 2 years until the past couple of months when he slowly began to have wt gain, worsening peripheral edema, increased SOB, DOE.  Symptoms acutely progressed over past week and now in to ED.  Wife had COVID last month, quarantined during that period.  Pt not had COVID vaccine.   ED Course: COVID positive.  CTA chest reveals both pulm edema and bibasilar infiltrates.  BNP 247.  60mg  IV lasix given in ED, large volume UOP already.  Initial O2 requirement of 5L, rapidly decreasing.   Review of Systems: As per HPI, otherwise all review of systems negative.  Past Medical History:  Diagnosis Date  . Arthritis    feet and hands  . HTN (hypertension)   . Obesity   . Sleep apnea    cpap    Past Surgical History:  Procedure Laterality Date  . BUNIONECTOMY     03-02-17 Dr. Berenice Primas  . BUNIONECTOMY Right 03/02/2017   Procedure: RIGHT 5TH BUNIONETTE EXCISION;  Surgeon: Dorna Leitz, MD;  Location[facility];  Service: Orthopedics;  Laterality: Right;  GRAVES NEED 1HR FOR PROCEDURE  . ENDOVENOUS ABLATION SAPHENOUS VEIN W/ LASER Right 04/06/2016   endovenous laser ablation right greater saphenous vein by Tinnie Gens MD   . I & D EXTREMITY Left 04/25/2016   Procedure: IRRIGATION AND DEBRIDEMENT EXTREMITY;  Surgeon: Rod Can, MD;  Location: [facility];  Service: Orthopedics;  Laterality: Left;     reports that he has never smoked. He has never used smokeless tobacco. He reports that he does not drink alcohol or use drugs.  No Known Allergies  Family History  Problem  Relation Age of Onset  . Hypertension Mother   . Hypertension Father      Prior to Admission medications   Medication Sig Start Date End Date Taking? Authorizing Provider  albuterol (PROVENTIL) (2.5 MG/3ML) 0.083% nebulizer solution Take 2.5 mg by nebulization every 6 (six) hours as needed for wheezing or shortness of breath.   Yes [provider]  diclofenac (VOLTAREN) 75 MG EC tablet Take 75 mg by mouth 2 (two) times daily.   Yes [provider]  fluticasone (FLONASE) 50 MCG/ACT nasal spray Place 1 spray into both nostrils daily.   Yes [provider]  furosemide (LASIX) 20 MG tablet Take 20 mg by mouth 2 (two) times daily.   Yes [provider]  Garlic [id number] MG TBEC Take 2,000 mg by mouth daily.   Yes [provider]  ibuprofen (ADVIL,MOTRIN) 200 MG tablet Take 400 mg by mouth every 6 (six) hours as needed for headache or moderate pain.   Yes [provider]  lisinopril (PRINIVIL,ZESTRIL) 40 MG tablet Take 40 mg by mouth daily.   Yes [provider]  Misc Natural Products (GLUCOSAMINE CHOND DOUBLE STR) TABS Take 1 tablet by mouth daily.   Yes [provider]  Multiple Vitamins-Minerals (MULTIVITAMIN WITH MINERALS) tablet Take 1 tablet by mouth daily.   Yes [provider]  Omega-3 Fatty Acids (FISH OIL) 1200 MG CAPS Take 1,200 mg by mouth daily.  Yes [provider]  spironolactone (ALDACTONE) 25 MG tablet Take 25 mg by mouth daily.   Yes [provider]    Physical Exam: Vitals:   06/09/19 1615 06/09/19 1630 06/09/19 2029 06/09/19 2037  BP: (!) 174/88 (!) 165/95  (!) 151/85  Pulse: 82 80  89  Resp: 19 20  15   Temp:      TempSrc:      SpO2: 92% 92% 99% 96%  Weight:      Height:        Constitutional: NAD, calm, comfortable Eyes: PERRL, lids and conjunctivae normal ENMT: Mucous membranes are moist. Posterior pharynx clear of any exudate or lesions.Normal dentition.  Neck: normal,  supple, no masses, no thyromegaly Respiratory: clear to auscultation bilaterally, no wheezing, no crackles. Normal respiratory effort. No accessory muscle use.  Cardiovascular: Regular rate and rhythm, no murmurs / rubs / gallops. No extremity edema. 2+ pedal pulses. No carotid bruits.  Abdomen: no tenderness, no masses palpated. No hepatosplenomegaly. Bowel sounds positive.  Musculoskeletal: no clubbing / cyanosis. No joint deformity upper and lower extremities. Good ROM, no contractures. Normal muscle tone.  Skin: no rashes, lesions, ulcers. No induration Neurologic: CN 2-12 grossly intact. Sensation intact, DTR normal. Strength 5/5 in all 4.  Psychiatric: Normal judgment and insight. Alert and oriented x 3. Normal mood.    Labs on Admission: I have personally reviewed following labs and imaging studies  CBC: Recent Labs  Lab 06/09/19 1415  WBC 9.3  HGB 12.9*  HCT 44.5  MCV 98.9  PLT 123XX123   Basic Metabolic Panel: Recent Labs  Lab 06/09/19 1415  NA 143  K 4.1  CL 100  CO2 34*  GLUCOSE 104*  BUN 17  CREATININE 0.92  CALCIUM 8.4*   GFR: Estimated Creatinine Clearance: 154 mL/min (by C-G formula based on SCr of 0.92 mg/dL). Liver Function Tests: No results for input(s): AST, ALT, ALKPHOS, BILITOT, PROT, ALBUMIN in the last 168 hours. No results for input(s): LIPASE, AMYLASE in the last 168 hours. No results for input(s): AMMONIA in the last 168 hours. Coagulation Profile: No results for input(s): INR, PROTIME in the last 168 hours. Cardiac Enzymes: No results for input(s): CKTOTAL, CKMB, CKMBINDEX, TROPONINI in the last 168 hours. BNP (last 3 results) No results for input(s): PROBNP in the last 8760 hours. HbA1C: No results for input(s): HGBA1C in the last 72 hours. CBG: No results for input(s): GLUCAP in the last 168 hours. Lipid Profile: No results for input(s): CHOL, HDL, LDLCALC, TRIG, CHOLHDL, LDLDIRECT in the last 72 hours. Thyroid Function Tests: No results  for input(s): TSH, T4TOTAL, FREET4, T3FREE, THYROIDAB in the last 72 hours. Anemia Panel: No results for input(s): VITAMINB12, FOLATE, FERRITIN, TIBC, IRON, RETICCTPCT in the last 72 hours. Urine analysis:    Component Value Date/Time   COLORURINE STRAW (A) 05/31/2017 0752   APPEARANCEUR CLEAR 05/31/2017 0752   LABSPEC 1.004 (L) 05/31/2017 0752   PHURINE 6.0 05/31/2017 0752   GLUCOSEU NEGATIVE 05/31/2017 0752   HGBUR NEGATIVE 05/31/2017 0752   BILIRUBINUR NEGATIVE 05/31/2017 0752   KETONESUR NEGATIVE 05/31/2017 0752   PROTEINUR NEGATIVE 05/31/2017 0752   NITRITE NEGATIVE 05/31/2017 0752   LEUKOCYTESUR NEGATIVE 05/31/2017 0752    Radiological Exams on Admission: No results found.  EKG: Independently reviewed.  Assessment/Plan Principal Problem:   Acute respiratory failure with hypoxia (HCC) Active Problems:   Morbid obesity with BMI of 50.0-59.9, adult (HCC)   Acute on chronic diastolic CHF (congestive heart failure) ([place])  COVID-19 virus infection    1. Acute resp failure with hypoxia - 1. Due to COVID and CHF, unclear how much each is contributing. 2. Rapid decline in O2 requirement in ED after Lasix and massive UOP suggest CHF the main driving factor at this time. 2. Acute on chronic diastolic CHF - 1. 2d echo 2. Lasix 40mg  IV daily for now 3. Strict intake and output 4. Daily CMP 5. Cont home aldactone 6. Cont home ACEi 3. COVID-19 1. COVID pathway 2. remdesivir per pharm 3. Holding off on steroids for the moment, start them if still requiring O2 by AM. 4. If able to get completely off of O2 thanks to diuresis by AM, consider if he may be a candidate for MAB and early discharge (preferably before COVID gets any worse). 5. Daily labs 6. No PE on CTA today  DVT prophylaxis: Lovenox Code Status: Full Family Communication: No family in room Disposition Plan: Home after off of oxygen Consults called: None Admission status: Place in 55   Garima Chronis, [place] [facility]  How to contact the [facility] Attending or Consulting provider [place] or covering provider during after hours [place], for this patient?  1. Check the care team in [facility] and look for a) attending/consulting [facility] provider listed and b) the [facility] team listed 2. Log into [email or web address]  Amion Physician Scheduling and messaging for groups and whole hospitals  On call and physician scheduling software for group practices, residents, hospitalists and other medical providers for call, clinic, rotation and shift schedules. On[employer] is a hospital-wide system for scheduling doctors and paging doctors on call. EasyPlot is for scientific plotting and data analysis.  [email or web address]  and use [facility]'s universal password to access. If you do not have the password, please contact the hospital operator.  3. Locate the [facility] provider you are looking for under [facility] and page to a number that you can be directly reached. 4. If you still have difficulty reaching the provider, please page the [facility] (Director on Call) for the Hospitalists listed on amion for assistance.  06/09/2019, 8:45 PM

## 2019-06-10 ENCOUNTER — Inpatient Hospital Stay (HOSPITAL_COMMUNITY): Payer: PRIVATE HEALTH INSURANCE

## 2019-06-10 ENCOUNTER — Other Ambulatory Visit: Payer: Self-pay

## 2019-06-10 DIAGNOSIS — J9601 Acute respiratory failure with hypoxia: Secondary | ICD-10-CM | POA: Diagnosis present

## 2019-06-10 DIAGNOSIS — Z6841 Body Mass Index (BMI) 40.0 and over, adult: Secondary | ICD-10-CM | POA: Diagnosis not present

## 2019-06-10 DIAGNOSIS — I5031 Acute diastolic (congestive) heart failure: Secondary | ICD-10-CM

## 2019-06-10 DIAGNOSIS — U071 COVID-19: Secondary | ICD-10-CM | POA: Diagnosis present

## 2019-06-10 DIAGNOSIS — I5033 Acute on chronic diastolic (congestive) heart failure: Secondary | ICD-10-CM | POA: Diagnosis present

## 2019-06-10 DIAGNOSIS — R7989 Other specified abnormal findings of blood chemistry: Secondary | ICD-10-CM

## 2019-06-10 DIAGNOSIS — Z79899 Other long term (current) drug therapy: Secondary | ICD-10-CM | POA: Diagnosis not present

## 2019-06-10 DIAGNOSIS — Z8249 Family history of ischemic heart disease and other diseases of the circulatory system: Secondary | ICD-10-CM | POA: Diagnosis not present

## 2019-06-10 DIAGNOSIS — Z791 Long term (current) use of non-steroidal anti-inflammatories (NSAID): Secondary | ICD-10-CM | POA: Diagnosis not present

## 2019-06-10 DIAGNOSIS — J1282 Pneumonia due to coronavirus disease 2019: Secondary | ICD-10-CM | POA: Diagnosis present

## 2019-06-10 DIAGNOSIS — I110 Hypertensive heart disease with heart failure: Secondary | ICD-10-CM | POA: Diagnosis present

## 2019-06-10 DIAGNOSIS — R0602 Shortness of breath: Secondary | ICD-10-CM | POA: Diagnosis present

## 2019-06-10 DIAGNOSIS — E662 Morbid (severe) obesity with alveolar hypoventilation: Secondary | ICD-10-CM | POA: Diagnosis present

## 2019-06-10 LAB — COMPREHENSIVE METABOLIC PANEL
ALT: 27 U/L (ref 0–44)
AST: 23 U/L (ref 15–41)
Albumin: 3.3 g/dL — ABNORMAL LOW (ref 3.5–5.0)
Alkaline Phosphatase: 103 U/L (ref 38–126)
Anion gap: 10 (ref 5–15)
BUN: 14 mg/dL (ref 6–20)
CO2: 38 mmol/L — ABNORMAL HIGH (ref 22–32)
Calcium: 8.6 mg/dL — ABNORMAL LOW (ref 8.9–10.3)
Chloride: 94 mmol/L — ABNORMAL LOW (ref 98–111)
Creatinine, Ser: 1 mg/dL (ref 0.61–1.24)
GFR calc Af Amer: >60 mL/min (ref >60)
GFR calc non Af Amer: >60 mL/min (ref >60)
Glucose, Bld: 99 mg/dL (ref 70–99)
Potassium: 4.2 mmol/L (ref 3.5–5.1)
Sodium: 142 mmol/L (ref 135–145)
Total Bilirubin: 1.2 mg/dL (ref 0.3–1.2)
Total Protein: 7.3 g/dL (ref 6.5–8.1)

## 2019-06-10 LAB — CBC WITH DIFFERENTIAL/PLATELET
Abs Immature Granulocytes: 0.03 10*3/uL (ref 0.00–0.07)
Basophils Absolute: 0 10*3/uL (ref 0.0–0.1)
Basophils Relative: 0 %
Eosinophils Absolute: 0.2 10*3/uL (ref 0.0–0.5)
Eosinophils Relative: 2 %
HCT: 43.8 % (ref 39.0–52.0)
Hemoglobin: 12.9 g/dL — ABNORMAL LOW (ref 13.0–17.0)
Immature Granulocytes: 0 %
Lymphocytes Relative: 9 %
Lymphs Abs: 0.9 10*3/uL (ref 0.7–4.0)
MCH: 29.3 pg (ref 26.0–34.0)
MCHC: 29.5 g/dL — ABNORMAL LOW (ref 30.0–36.0)
MCV: 99.3 fL (ref 80.0–100.0)
Monocytes Absolute: 0.7 10*3/uL (ref 0.1–1.0)
Monocytes Relative: 7 %
Neutro Abs: 8.2 10*3/uL — ABNORMAL HIGH (ref 1.7–7.7)
Neutrophils Relative %: 82 %
Platelets: 195 10*3/uL (ref 150–400)
RBC: 4.41 MIL/uL (ref 4.22–5.81)
RDW: 16.4 % — ABNORMAL HIGH (ref 11.5–15.5)
WBC: 10 10*3/uL (ref 4.0–10.5)
nRBC: 0 % (ref 0.0–0.2)

## 2019-06-10 LAB — D-DIMER, QUANTITATIVE: D-Dimer, Quant: 3.93 ug/mL-FEU — ABNORMAL HIGH (ref 0.00–0.50)

## 2019-06-10 LAB — MAGNESIUM: Magnesium: 2.1 mg/dL (ref 1.7–2.4)

## 2019-06-10 LAB — BRAIN NATRIURETIC PEPTIDE: B Natriuretic Peptide: 212.2 pg/mL — ABNORMAL HIGH (ref 0.0–100.0)

## 2019-06-10 LAB — ECHOCARDIOGRAM COMPLETE
Height: 71 in
Weight: 6240 oz

## 2019-06-10 LAB — GLUCOSE, CAPILLARY: Glucose-Capillary: 156 mg/dL — ABNORMAL HIGH (ref 70–99)

## 2019-06-10 LAB — C-REACTIVE PROTEIN: CRP: 1.9 mg/dL — ABNORMAL HIGH (ref <1.0)

## 2019-06-10 MED ORDER — PERFLUTREN LIPID MICROSPHERE
1.0000 mL | INTRAVENOUS | Status: AC | PRN
  Administered 2019-06-10 (×2): 2 mL via INTRAVENOUS
  Filled 2019-06-10: qty 10

## 2019-06-10 MED ORDER — DEXAMETHASONE SODIUM PHOSPHATE 10 MG/ML IJ SOLN
6.0000 mg | INTRAMUSCULAR | Status: DC
  Administered 2019-06-10 – 2019-06-12 (×3): 6 mg via INTRAVENOUS
  Filled 2019-06-10 (×3): qty 1

## 2019-06-10 MED ORDER — ENOXAPARIN SODIUM 100 MG/ML ~~LOC~~ SOLN
90.0000 mg | SUBCUTANEOUS | Status: DC
  Administered 2019-06-10 – 2019-06-15 (×6): 90 mg via SUBCUTANEOUS
  Filled 2019-06-10 (×2): qty 1
  Filled 2019-06-10: qty 0.9
  Filled 2019-06-10: qty 1
  Filled 2019-06-10: qty 0.9
  Filled 2019-06-10 (×2): qty 1

## 2019-06-10 MED ORDER — CARVEDILOL 3.125 MG PO TABS
3.1250 mg | ORAL_TABLET | Freq: Two times a day (BID) | ORAL | Status: DC
  Administered 2019-06-10 – 2019-06-16 (×12): 3.125 mg via ORAL
  Filled 2019-06-10 (×14): qty 1

## 2019-06-10 NOTE — Progress Notes (Signed)
PROGRESS NOTE                                                                                                                                                                                                             Patient Demographics:    Jeffery Simon, is a 54 y.o. male, DOB - 16-Aug-1965, OYD:[id number]  Outpatient Primary MD for the patient is Greig Right, MD    LOS - 1  Admit date - 06/09/2019    Chief Complaint  Patient presents with  . Leg Swelling  . hypoxia       Brief Narrative - Jeffery Simon is a 55 y.o. male with medical history significant of HTN, obesity, HFpEF grade 2 diastolic dysfunction in 6720.  Pt stable on 50m lasix at home for past 2 years until the past couple of months when he slowly began to have wt gain, worsening peripheral edema, increased SOB, DOE. In the ER diagnosed with CHF + Covid.   Subjective:    Jeffery Simon has, No headache, No chest pain, No abdominal pain - No Nausea, No new weakness tingling or numbness, improving SOB.   Assessment  & Plan :     1. Acute Hypoxic Resp. Failure due to Acute Covid 19 Viral Pneumonitis + CHF - seems to have moderate disease, placed on IV steroid + IV remdesivir and IV Lasix.  He has consented for Actemra use if needed.  Encouraged the patient to sit up in chair in the daytime use I-S and flutter valve for pulmonary toiletry and then prone in bed when at night.  Will advance activity and titrate down oxygen as possible.  Actemra off label use - patient was told that if COVID-19 pneumonitis gets worse we might potentially use Actemra off label, patient denies any known history of active diverticulitis, tuberculosis or hepatitis, understands the risks and benefits and wants to proceed with Actemra treatment if required.  SpO2: 97 % O2 Flow Rate (L/min): 2 L/min  Recent Labs  Lab 06/09/19 1415 06/09/19 1543 06/09/19 2002  06/10/19 0500  CRP  --   --  2.2* 1.9*  DDIMER 3.69*  --   --  3.93*  BNP 247.5*  --   --   --   PROCALCITON  --   --  <0.10  --   SARSCOV2NAA  --  POSITIVE*  --   --     Hepatic Function Latest Ref Rng & Units 06/10/2019 04/27/2016 04/24/2016  Total Protein 6.5 - 8.1 g/dL 7.3 7.1 7.3  Albumin 3.5 - 5.0 g/dL 3.3(L) 3.3(L) 3.5  AST 15 - 41 U/L 23 26 57(H)  ALT 0 - 44 U/L 27 62 113(H)  Alk Phosphatase 38 - 126 U/L 103 105 106  Total Bilirubin 0.3 - 1.2 mg/dL 1.2 0.7 0.9     2.  Acute on chronic diastolic CHF EF 79% last TTE in 2019 - diurese, Coreg, IV Lasix, repeat TTE.  3.  Morbid obesity BMI 50 changed with OSA.  Follow with PCP for weight loss: BiPAP at at bedtime.  4. High D Dimer - check Leg [place]. CTA is -ve for PE.    Condition - Fair  Family Communication  :  None  Code Status :  Full  Consults  :  None  Procedures  :    CTA - Right-sided pleural effusion and bibasilar patchy opacities which may be related to the underlying clinical history. Mild pulmonary edema. Hilar and mediastinal lymph nodes likely reactive in nature but not significant by size criteria. No evidence of pulmonary embolus  TTE  Leg [place]  PUD Prophylaxis : None  Disposition Plan  :    Status is: Observation > Inpt   Dispo: The patient is from: Home              Anticipated d/c is to: Home              Anticipated d/c date is: 3 days              Patient currently is not medically stable to d/c.   DVT Prophylaxis  :  Lovenox   Lab Results  Component Value Date   PLT 195 06/10/2019    Diet :  Diet Order            Diet Heart Room service appropriate? Yes; Fluid consistency: Thin  Diet effective now               Inpatient Medications  Scheduled Meds: . dexamethasone (DECADRON) injection  6 mg Intravenous Q24H  . enoxaparin (LOVENOX) injection  90 mg Subcutaneous Q24H  . fluticasone  1 spray Each Nare Daily  . furosemide  40 mg Intravenous Daily  . multivitamin with  minerals  1 tablet Oral Daily  . omega-3 acid ethyl esters  1 g Oral Daily  . sodium chloride flush  3 mL Intravenous Once  . spironolactone  25 mg Oral Daily   Continuous Infusions: . remdesivir 100 mg in NS 100 mL     PRN Meds:.acetaminophen, albuterol, [DISCONTINUED] ondansetron **OR** ondansetron (ZOFRAN) IV  Antibiotics  :    Anti-infectives (From admission, onward)   Start     Dose/Rate Route Frequency Ordered Stop   06/10/19 1600  remdesivir 100 mg in sodium chloride 0.9 % 100 mL IVPB     100 mg 200 mL/hr over 30 Minutes Intravenous Daily 06/09/19 1951 06/14/19 1559   06/09/19 2000  remdesivir 200 mg in sodium chloride 0.9% 250 mL IVPB     200 mg 580 mL/hr over 30 Minutes Intravenous Once 06/09/19 1951 06/09/19 2120       Time Spent in minutes  30   Jeffery Simon M.D on 06/10/2019 at 9:29 AM  To page go to [email or web address] - password [facility]  [facility] -  Office  [phone]  See all Orders from today for further details    Objective:   Vitals:   06/10/19 0430 06/10/19 0500 06/10/19 0530 06/10/19 0600  BP: (!) 151/86 (!) 146/87 (!) 145/86 126/65  Pulse: 80 80 80 78  Resp: _0 Temp:      TempSrc:      SpO2: 96% 97% 97% 97%  Weight:      Height:        Wt Readings from Last 3 Encounters:  06/09/19 (!) 176.9 kg  08/01/18 (!) 176 kg  06/01/17 (!) 173.7 kg     Intake/Output Summary (Last 24 hours) at 06/10/2019 0929 Last data filed at 06/10/2019 0400 Gross per 24 hour  Intake --  Output 6200 ml  Net -6200 ml     Physical Exam  Awake Alert, No new F.N deficits, Normal affect [place].AT,PERRAL Supple Neck,No JVD, No cervical lymphadenopathy appriciated.  Symmetrical Chest wall movement, Good air movement bilaterally, few rales RRR,No Gallops,Rubs or new Murmurs, No Parasternal Heave +ve B.Sounds, Abd Soft, No tenderness, No organomegaly appriciated, No rebound - guarding or rigidity. No Cyanosis, 1-2 + edema, mild redness    Data  Review:    CBC Recent Labs  Lab 06/09/19 1415 06/10/19 0500  WBC 9.3 10.0  HGB 12.9* 12.9*  HCT 44.5 43.8  PLT 216 195  MCV 98.9 99.3  MCH 28.7 29.3  MCHC 29.0* 29.5*  RDW 16.8* 16.4*  LYMPHSABS  --  0.9  MONOABS  --  0.7  EOSABS  --  0.2  BASOSABS  --  0.0    Chemistries  Recent Labs  Lab 06/09/19 1415 06/10/19 0500  NA 143 142  K 4.1 4.2  CL 100 94*  CO2 34* 38*  GLUCOSE 104* 99  BUN 17 14  CREATININE 0.92 1.00  CALCIUM 8.4* 8.6*  AST  --  23  ALT  --  27  ALKPHOS  --  103  BILITOT  --  1.2     ------------------------------------------------------------------------------------------------------------------ No results for input(s): CHOL, HDL, LDLCALC, TRIG, CHOLHDL, LDLDIRECT in the last 72 hours.  Lab Results  Component Value Date   HGBA1C 5.5 05/31/2017   ------------------------------------------------------------------------------------------------------------------ No results for input(s): TSH, T4TOTAL, T3FREE, THYROIDAB in the last 72 hours.  Invalid input(s): FREET3  Cardiac Enzymes No results for input(s): CKMB, TROPONINI, MYOGLOBIN in the last 168 hours.  Invalid input(s): CK ------------------------------------------------------------------------------------------------------------------    Component Value Date/Time   BNP 247.5 (H) 06/09/2019 1415    Micro Results Recent Results (from the past 240 hour(s))  SARS Coronavirus 2 by RT PCR (hospital order, performed in [facility] hospital lab) Nasopharyngeal Nasopharyngeal Swab     Status: Abnormal   Collection Time: 06/09/19  3:43 PM   Specimen: Nasopharyngeal Swab  Result Value Ref Range Status   SARS Coronavirus 2 POSITIVE (A) NEGATIVE Final    Comment: RESULT CALLED TO, READ BACK BY AND VERIFIED WITH: RN Arvella Merles [id number] AT 8338 BY CM (NOTE) SARS-CoV-2 target nucleic acids are DETECTED SARS-CoV-2 RNA is generally detectable in upper respiratory specimens  during the acute  phase of infection.  Positive results are indicative  of the presence of the identified virus, but do not rule out bacterial infection or co-infection with other pathogens not detected by the test.  Clinical correlation with patient history and  other diagnostic information is necessary to determine patient infection status.  The expected result is negative. Fact Sheet for Patients:   StrictlyIdeas.no  Fact Sheet for Healthcare  Providers:   BankingDealers.co.za   This test is not yet approved or cleared by the [place] and  has been authorized for detection and/or diagnosis of SARS-CoV-2 by FDA under an Emergency Use Authorization (EUA).  This EUA will remain in effect (meaning this test  can be used) for the duration of  the COVID-19 declaration under Section 564(b)(1) of the Act, 21 U.S.C. section 360-bbb-3(b)(1), unless the authorization is terminated or revoked sooner. Performed at [facility], [place] [address], [place], [place] [address]     Radiology Reports DG Chest 2 View  Result Date: 06/09/2019 CLINICAL DATA:  Shortness of breath for 1 week with lower extremity swelling, initial encounter EXAM: CHEST - 2 VIEW COMPARISON:  05/30/2017 FINDINGS: Cardiac shadow is mildly prominent. Vascular congestion is noted with mild pulmonary edema consistent with CHF. Small right-sided pleural effusion is noted. Some right basilar atelectasis/early infiltrate is also noted. No bony abnormality is noted. IMPRESSION: Changes of CHF with right basilar infiltrate/atelectasis. Electronically Signed   By: Inez Catalina M.D.   On: 06/09/2019 14:50   CT Angio Chest PE W and/or Wo Contrast  Result Date: 06/09/2019 CLINICAL DATA:  History of COVID-19 positivity with chest pain and lower extremity swelling EXAM: CT ANGIOGRAPHY CHEST WITH CONTRAST TECHNIQUE: Multidetector CT imaging of the chest was performed using the standard protocol during  bolus administration of intravenous contrast. Multiplanar CT image reconstructions and MIPs were obtained to evaluate the vascular anatomy. CONTRAST:  125m OMNIPAQUE IOHEXOL 350 MG/ML SOLN COMPARISON:  None. FINDINGS: Cardiovascular: Thoracic aorta shows no aneurysmal dilatation. No cardiac enlargement is seen. Pulmonary artery opacification is somewhat limited due to timing of the contrast bolus although no pulmonary embolus is noted. Mediastinum/Nodes: Thoracic inlet is within normal limits. Scattered small hilar and mediastinal lymph nodes are noted likely reactive in nature. The esophagus is within normal limits as visualized. Lungs/Pleura: Moderate right-sided pleural effusion is noted. Mild interstitial edema is seen. Patchy infiltrate is noted in the bases bilaterally. This may be related to the known COVID-19 infection. No sizable parenchymal nodules are noted. Upper Abdomen: Visualized upper abdomen is within normal limits. Musculoskeletal: Degenerative changes of the thoracic spine are seen. Review of the MIP images confirms the above findings. IMPRESSION: Right-sided pleural effusion and bibasilar patchy opacities which may be related to the underlying clinical history. Mild pulmonary edema. Hilar and mediastinal lymph nodes likely reactive in nature but not significant by size criteria. No evidence of pulmonary embolus. Electronically Signed   By: MInez CatalinaM.D.   On: 06/09/2019 17:54

## 2019-06-10 NOTE — ED Notes (Signed)
Visually checked on Pt he was sleeping well with normal breathing pattern

## 2019-06-10 NOTE — Progress Notes (Signed)
VASCULAR LAB PRELIMINARY  PRELIMINARY  PRELIMINARY  PRELIMINARY  Bilateral lower extremity venous duplex completed.    Preliminary report:  See CV proc for preliminary results.  Earlean Fidalgo, RVT 06/10/2019, 1:54 PM

## 2019-06-10 NOTE — Progress Notes (Signed)
*  PRELIMINARY RESULTS* Echocardiogram 2D Echocardiogram has been performed with Definity.  Samuel Germany 06/10/2019, 5:32 PM

## 2019-06-10 NOTE — ED Notes (Signed)
At Pt's request brought him a spirte

## 2019-06-10 NOTE — ED Notes (Signed)
Breakfast Ordered 

## 2019-06-11 LAB — CBC WITH DIFFERENTIAL/PLATELET
Abs Immature Granulocytes: 0.04 10*3/uL (ref 0.00–0.07)
Basophils Absolute: 0 10*3/uL (ref 0.0–0.1)
Basophils Relative: 0 %
Eosinophils Absolute: 0 10*3/uL (ref 0.0–0.5)
Eosinophils Relative: 0 %
HCT: 43.1 % (ref 39.0–52.0)
Hemoglobin: 12.8 g/dL — ABNORMAL LOW (ref 13.0–17.0)
Immature Granulocytes: 0 %
Lymphocytes Relative: 6 %
Lymphs Abs: 0.7 10*3/uL (ref 0.7–4.0)
MCH: 28.8 pg (ref 26.0–34.0)
MCHC: 29.7 g/dL — ABNORMAL LOW (ref 30.0–36.0)
MCV: 97.1 fL (ref 80.0–100.0)
Monocytes Absolute: 0.6 10*3/uL (ref 0.1–1.0)
Monocytes Relative: 5 %
Neutro Abs: 9.7 10*3/uL — ABNORMAL HIGH (ref 1.7–7.7)
Neutrophils Relative %: 89 %
Platelets: 179 10*3/uL (ref 150–400)
RBC: 4.44 MIL/uL (ref 4.22–5.81)
RDW: 15.9 % — ABNORMAL HIGH (ref 11.5–15.5)
WBC: 11 10*3/uL — ABNORMAL HIGH (ref 4.0–10.5)
nRBC: 0 % (ref 0.0–0.2)

## 2019-06-11 LAB — COMPREHENSIVE METABOLIC PANEL
ALT: 23 U/L (ref 0–44)
AST: 27 U/L (ref 15–41)
Albumin: 2.9 g/dL — ABNORMAL LOW (ref 3.5–5.0)
Alkaline Phosphatase: 87 U/L (ref 38–126)
Anion gap: 8 (ref 5–15)
BUN: 16 mg/dL (ref 6–20)
CO2: 36 mmol/L — ABNORMAL HIGH (ref 22–32)
Calcium: 8.3 mg/dL — ABNORMAL LOW (ref 8.9–10.3)
Chloride: 96 mmol/L — ABNORMAL LOW (ref 98–111)
Creatinine, Ser: 0.77 mg/dL (ref 0.61–1.24)
GFR calc Af Amer: >60 mL/min (ref >60)
GFR calc non Af Amer: >60 mL/min (ref >60)
Glucose, Bld: 116 mg/dL — ABNORMAL HIGH (ref 70–99)
Potassium: 4.9 mmol/L (ref 3.5–5.1)
Sodium: 140 mmol/L (ref 135–145)
Total Bilirubin: 1.1 mg/dL (ref 0.3–1.2)
Total Protein: 6.4 g/dL — ABNORMAL LOW (ref 6.5–8.1)

## 2019-06-11 LAB — C-REACTIVE PROTEIN: CRP: 2.5 mg/dL — ABNORMAL HIGH (ref <1.0)

## 2019-06-11 LAB — MAGNESIUM: Magnesium: 2.2 mg/dL (ref 1.7–2.4)

## 2019-06-11 LAB — MRSA PCR SCREENING: MRSA by PCR: NEGATIVE

## 2019-06-11 LAB — GLUCOSE, CAPILLARY: Glucose-Capillary: 83 mg/dL (ref 70–99)

## 2019-06-11 LAB — D-DIMER, QUANTITATIVE: D-Dimer, Quant: 3.92 ug/mL-FEU — ABNORMAL HIGH (ref 0.00–0.50)

## 2019-06-11 LAB — BRAIN NATRIURETIC PEPTIDE: B Natriuretic Peptide: 175.7 pg/mL — ABNORMAL HIGH (ref 0.0–100.0)

## 2019-06-11 MED ORDER — LISINOPRIL 5 MG PO TABS
5.0000 mg | ORAL_TABLET | Freq: Every day | ORAL | Status: DC
  Administered 2019-06-11 – 2019-06-16 (×6): 5 mg via ORAL
  Filled 2019-06-11 (×6): qty 1

## 2019-06-11 MED ORDER — FUROSEMIDE 10 MG/ML IJ SOLN
60.0000 mg | Freq: Every day | INTRAMUSCULAR | Status: DC
  Administered 2019-06-11 – 2019-06-12 (×2): 60 mg via INTRAVENOUS
  Filled 2019-06-11 (×2): qty 6

## 2019-06-11 NOTE — Plan of Care (Signed)
Nutrition Education Note  RD consulted for diet education. RD working remotely. RD contacted pt via inpatient room phone. RD attached "Low Sodium Nutrition Therapy" handout from the Academy of [employer] to pt's discharge instructions. Reviewed patient's dietary recall. Provided examples on ways to decrease sodium intake in diet. Discouraged intake of processed foods and use of salt shaker. Encouraged fresh fruits and vegetables as well as whole grain sources of carbohydrates to maximize fiber intake. Discussed low sodium snack ideas. Teach back method used.  Expect good compliance.  Body mass index is 56.76 kg/m. Pt meets criteria for morbid obesity based on current BMI.  Current diet order is heart diet with thin liquids, patient is consuming approximately 100% of meals at this time. Labs and medications reviewed. No further nutrition interventions warranted at this time. RD contact information provided. If additional nutrition issues arise, please re-consult RD.   Corrin Parker, MS, RD, LDN RD pager number/after hours weekend pager number on Amion.

## 2019-06-11 NOTE — Progress Notes (Addendum)
Paged Dr. Myna Hidalgo about the CPAP ordered HS.  RT states it is against their protocol since patient is COVID +.  Patient desats while sleeping.    Spoke with RT and they suggested 15L HFNC and 15L NRB tonight when he sleeps since patient is a mouth breather.  Updated Dr. Myna Hidalgo on RT suggestion.  RT said that if that does not work, they would recommend [facility] [place].

## 2019-06-11 NOTE — Progress Notes (Signed)
Pt ambulated 150 ft+ down hallway. Pt O2 saturation dropped halfway through to low 80s.O2 increased to 6L [place].Saturations recovered to mid 90s. O2 dropped down to 4L [place] for remainder of walk. Pt tolerated well. Back in chair on 3L  with O2 sats at 91%.

## 2019-06-11 NOTE — Progress Notes (Signed)
PROGRESS NOTE                                                                                                                                                                                                             Patient Demographics:    Jeffery Simon, is a 54 y.o. male, DOB - 1965-05-14, BTD:[id number]  Outpatient Primary MD for the patient is Jeffery Right, MD    LOS - 2  Admit date - 06/09/2019    Chief Complaint  Patient presents with   Leg Swelling   hypoxia       Brief Narrative - Jeffery Simon is a 54 y.o. male with medical history significant of HTN, obesity, HFpEF grade 2 diastolic dysfunction in 1062.  Pt stable on 72m lasix at home for past 2 years until the past couple of months when he slowly began to have wt gain, worsening peripheral edema, increased SOB, DOE. In the ER diagnosed with CHF + Covid.   Subjective:   Patient in bed, appears comfortable, denies any headache, no fever, no chest pain or pressure, much improved shortness of breath , no abdominal pain. No focal weakness.   Assessment  & Plan :     1. Acute Hypoxic Resp. Failure due to Acute Covid 19 Viral Pneumonitis + CHF - seems to have moderate disease, placed on IV steroid + IV remdesivir and IV Lasix.  With diuresis and treatment for COVID-19 infection his hypoxia and shortness of breath have improved and he continues to move in the Simon direction.  Encouraged the patient to sit up in chair in the daytime use I-S and flutter valve for pulmonary toiletry and then prone in bed when at night.  Will advance activity and titrate down oxygen as possible.  SpO2: 93 % O2 Flow Rate (L/min): 3 L/min  Recent Labs  Lab 06/09/19 1415 06/09/19 1543 06/09/19 2002 06/10/19 0500 06/11/19 0440  CRP  --   --  2.2* 1.9* 2.5*  DDIMER 3.69*  --   --  3.93* 3.92*  BNP 247.5*  --   --  212.2* 175.7*  PROCALCITON  --   --  <0.10  --   --     SARSCOV2NAA  --  POSITIVE*  --   --   --     Hepatic Function Latest Ref Rng &  Units 06/11/2019 06/10/2019 04/27/2016  Total Protein 6.5 - 8.1 g/dL 6.4(L) 7.3 7.1  Albumin 3.5 - 5.0 g/dL 2.9(L) 3.3(L) 3.3(L)  AST 15 - 41 U/L 27 23 26   ALT 0 - 44 U/L 23 27 62  Alk Phosphatase 38 - 126 U/L 87 103 105  Total Bilirubin 0.3 - 1.2 mg/dL 1.1 1.2 0.7     2.  Acute on chronic diastolic CHF EF 76% -continue diuresis with IV Lasix, placed on Coreg and ACE inhibitor, echocardiogram noted with preserved EF and no wall motion abnormality.  Will monitor closely with ongoing diuresis.  Total 9 L negative so far.  3.  Morbid obesity BMI 50 changed with OSA.  Follow with PCP for weight loss: BiPAP at at bedtime.  4. High D Dimer - leg ultrasound and CTA negative for any clots, continue moderate dose Lovenox and monitor.    Condition - Fair  Family Communication  :  None  Code Status :  Full  Consults  : Wife [name] [phone]  on 06/11/2019  Procedures  :    CTA - Simon-sided pleural effusion and bibasilar patchy opacities which may be related to the underlying clinical history. Mild pulmonary edema. Hilar and mediastinal lymph nodes likely reactive in nature but not significant by size criteria. No evidence of pulmonary embolus  TTE  1. Left ventricular ejection fraction, by estimation, is 50 to 55%. The left ventricle has low normal function. Left ventricular endocardial border not optimally defined to evaluate regional wall motion. The left ventricular internal cavity size was moderately dilated. Left ventricular diastolic parameters are  indeterminate.  2. Simon ventricular systolic function is normal. The Simon ventricular size is normal. Tricuspid regurgitation signal is inadequate for assessing PA pressure.  3. Left atrial size was mildly dilated.  4. The mitral valve is grossly normal. Trivial mitral valve regurgitation.  5. The aortic valve was not well visualized. Aortic valve  regurgitation is not visualized. Mild aortic valve sclerosis is present, with no evidence of aortic valve stenosis.  6. The inferior vena cava is dilated in size with >50% respiratory variability, suggesting Simon atrial pressure of 8 mmHg.    Leg [place] - No DVT  PUD Prophylaxis : None  Disposition Plan  :    Status is: Observation > Inpt   Dispo: The patient is from: Home              Anticipated d/c is to: Home              Anticipated d/c date is: 3 days              Patient currently is not medically stable to d/c.   DVT Prophylaxis  :  Lovenox   Lab Results  Component Value Date   PLT 179 06/11/2019    Diet :  Diet Order            Diet Heart Room service appropriate? Yes; Fluid consistency: Thin  Diet effective now               Inpatient Medications  Scheduled Meds:  carvedilol  3.125 mg Oral BID WC   dexamethasone (DECADRON) injection  6 mg Intravenous Q24H   enoxaparin (LOVENOX) injection  90 mg Subcutaneous Q24H   fluticasone  1 spray Each Nare Daily   furosemide  60 mg Intravenous Daily   lisinopril  5 mg Oral Daily   multivitamin with minerals  1 tablet Oral Daily   omega-3 acid  ethyl esters  1 g Oral Daily   sodium chloride flush  3 mL Intravenous Once   spironolactone  25 mg Oral Daily   Continuous Infusions:  remdesivir 100 mg in NS 100 mL Stopped (06/10/19 1821)   PRN Meds:.acetaminophen, albuterol, [DISCONTINUED] ondansetron **OR** ondansetron (ZOFRAN) IV  Antibiotics  :    Anti-infectives (From admission, onward)   Start     Dose/Rate Route Frequency Ordered Stop   06/10/19 1600  remdesivir 100 mg in sodium chloride 0.9 % 100 mL IVPB     100 mg 200 mL/hr over 30 Minutes Intravenous Daily 06/09/19 1951 06/14/19 1559   06/09/19 2000  remdesivir 200 mg in sodium chloride 0.9% 250 mL IVPB     200 mg 580 mL/hr over 30 Minutes Intravenous Once 06/09/19 1951 06/09/19 2120       Time Spent in minutes  30   Lala Lund M.D on  06/11/2019 at 10:55 AM  To page go to [email or web address] - password [email or web address]  [facility] -  Office  [phone]   See all Orders from today for further details    Objective:   Vitals:   06/10/19 2020 06/10/19 2352 06/11/19 0400 06/11/19 0812  BP: (!) 154/76 (!) 145/89 (!) 141/72 120/72  Pulse: 89 88 72 85  Resp: (!) 21 (!) 24 16 16   Temp: 98.4 F (36.9 C) 98.4 F (36.9 C) 98 F (36.7 C) 97.9 F (36.6 C)  TempSrc: Oral Oral Axillary Oral  SpO2: 91% 91% 97% 93%  Weight: (!) 184.6 kg     Height:        Wt Readings from Last 3 Encounters:  06/10/19 (!) 184.6 kg  08/01/18 (!) 176 kg  06/01/17 (!) 173.7 kg     Intake/Output Summary (Last 24 hours) at 06/11/2019 1055 Last data filed at 06/11/2019 0900 Gross per 24 hour  Intake 580 ml  Output 3150 ml  Net -2570 ml     Physical Exam  Awake Alert, No new F.N deficits, Normal affect [place].AT,PERRAL Supple Neck,No JVD, No cervical lymphadenopathy appriciated.  Symmetrical Chest wall movement, Good air movement bilaterally, few rales RRR,No Gallops, Rubs or new Murmurs, No Parasternal Heave +ve B.Sounds, Abd Soft, No tenderness, No organomegaly appriciated, No rebound - guarding or rigidity. No Cyanosis, Clubbing , 1+ edema   Data Review:    CBC Recent Labs  Lab 06/09/19 1415 06/10/19 0500 06/11/19 0440  WBC 9.3 10.0 11.0*  HGB 12.9* 12.9* 12.8*  HCT 44.5 43.8 43.1  PLT 216 195 179  MCV 98.9 99.3 97.1  MCH 28.7 29.3 28.8  MCHC 29.0* 29.5* 29.7*  RDW 16.8* 16.4* 15.9*  LYMPHSABS  --  0.9 0.7  MONOABS  --  0.7 0.6  EOSABS  --  0.2 0.0  BASOSABS  --  0.0 0.0    Chemistries  Recent Labs  Lab 06/09/19 1415 06/10/19 0500 06/11/19 0440  NA 143 142 140  K 4.1 4.2 4.9  CL 100 94* 96*  CO2 34* 38* 36*  GLUCOSE 104* 99 116*  BUN 17 14 16   CREATININE 0.92 1.00 0.77  CALCIUM 8.4* 8.6* 8.3*  AST  --  23 27  ALT  --  27 23  ALKPHOS  --  103 87  BILITOT  --  1.2 1.1  MG  --  2.1 2.2       ------------------------------------------------------------------------------------------------------------------ No results for input(s): CHOL, HDL, LDLCALC, TRIG, CHOLHDL, LDLDIRECT in the last 72 hours.  Lab Results  Component Value Date  HGBA1C 5.5 05/31/2017   ------------------------------------------------------------------------------------------------------------------ No results for input(s): TSH, T4TOTAL, T3FREE, THYROIDAB in the last 72 hours.  Invalid input(s): FREET3  Cardiac Enzymes No results for input(s): CKMB, TROPONINI, MYOGLOBIN in the last 168 hours.  Invalid input(s): CK ------------------------------------------------------------------------------------------------------------------    Component Value Date/Time   BNP 175.7 (H) 06/11/2019 0440    Micro Results Recent Results (from the past 240 hour(s))  SARS Coronavirus 2 by RT PCR (hospital order, performed in [facility] hospital lab) Nasopharyngeal Nasopharyngeal Swab     Status: Abnormal   Collection Time: 06/09/19  3:43 PM   Specimen: Nasopharyngeal Swab  Result Value Ref Range Status   SARS Coronavirus 2 POSITIVE (A) NEGATIVE Final    Comment: RESULT CALLED TO, READ BACK BY AND VERIFIED WITH: RN Arvella Merles [id number] AT [id number] BY CM (NOTE) SARS-CoV-2 target nucleic acids are DETECTED SARS-CoV-2 RNA is generally detectable in upper respiratory specimens  during the acute phase of infection.  Positive results are indicative  of the presence of the identified virus, but do not rule out bacterial infection or co-infection with other pathogens not detected by the test.  Clinical correlation with patient history and  other diagnostic information is necessary to determine patient infection status.  The expected result is negative. Fact Sheet for Patients:   StrictlyIdeas.no  Fact Sheet for Healthcare Providers:   BankingDealers.co.za   This test is not  yet approved or cleared by the [place] FDA and  has been authorized for detection and/or diagnosis of SARS-CoV-2 by FDA under an Emergency Use Authorization (EUA).  This EUA will remain in effect (meaning this test  can be used) for the duration of  the COVID-19 declaration under Section 564(b)(1) of the Act, 21 U.S.C. section 360-bbb-3(b)(1), unless the authorization is terminated or revoked sooner. Performed at [facility], [place] [address] [place], [place] [address]     Radiology Reports DG Chest 2 View  Result Date: 06/09/2019 CLINICAL DATA:  Shortness of breath for 1 week with lower extremity swelling, initial encounter EXAM: CHEST - 2 VIEW COMPARISON:  05/30/2017 FINDINGS: Cardiac shadow is mildly prominent. Vascular congestion is noted with mild pulmonary edema consistent with CHF. Small Simon-sided pleural effusion is noted. Some Simon basilar atelectasis/early infiltrate is also noted. No bony abnormality is noted. IMPRESSION: Changes of CHF with Simon basilar infiltrate/atelectasis. Electronically Signed   By: Inez Catalina M.D.   On: 06/09/2019 14:50   CT Angio Chest PE W and/or Wo Contrast  Result Date: 06/09/2019 CLINICAL DATA:  History of COVID-19 positivity with chest pain and lower extremity swelling EXAM: CT ANGIOGRAPHY CHEST WITH CONTRAST TECHNIQUE: Multidetector CT imaging of the chest was performed using the standard protocol during bolus administration of intravenous contrast. Multiplanar CT image reconstructions and MIPs were obtained to evaluate the vascular anatomy. CONTRAST:  192m OMNIPAQUE IOHEXOL 350 MG/ML SOLN COMPARISON:  None. FINDINGS: Cardiovascular: Thoracic aorta shows no aneurysmal dilatation. No cardiac enlargement is seen. Pulmonary artery opacification is somewhat limited due to timing of the contrast bolus although no pulmonary embolus is noted. Mediastinum/Nodes: Thoracic inlet is within normal limits. Scattered small hilar and mediastinal lymph  nodes are noted likely reactive in nature. The esophagus is within normal limits as visualized. Lungs/Pleura: Moderate Simon-sided pleural effusion is noted. Mild interstitial edema is seen. Patchy infiltrate is noted in the bases bilaterally. This may be related to the known COVID-19 infection. No sizable parenchymal nodules are noted. Upper Abdomen: Visualized upper abdomen is within normal limits. Musculoskeletal:  Degenerative changes of the thoracic spine are seen. Review of the MIP images confirms the above findings. IMPRESSION: Simon-sided pleural effusion and bibasilar patchy opacities which may be related to the underlying clinical history. Mild pulmonary edema. Hilar and mediastinal lymph nodes likely reactive in nature but not significant by size criteria. No evidence of pulmonary embolus. Electronically Signed   By: Inez Catalina M.D.   On: 06/09/2019 17:54   ECHOCARDIOGRAM COMPLETE  Result Date: 06/10/2019    ECHOCARDIOGRAM REPORT   Patient Name:   SCOTTY WEIGELT Date of Exam: 06/10/2019 Medical Rec #:  [id number]        Height:       71.0 in Accession #:    [id number]       Weight:       390.0 lb Date of Birth:  1965-03-26        BSA:          2.801 m Patient Age:    55 years         BP:           126/65 mmHg Patient Gender: M                HR:           82 bpm. Exam Location:  Inpatient Procedure: 2D Echo, Cardiac Doppler and Color Doppler Indications:    CHF-Acute Diastolic  History:        Patient has prior history of Echocardiogram examinations, most                 recent 06/01/2017. Risk Factors:Hypertension. Morbid Obesity,                 Covid-19,OSA (obstructive sleep apnea).  Sonographer:    Alvino Chapel RCS Referring Phys: [id number] JARED M GARDNER  Sonographer Comments: Technically difficult study due to poor echo windows. IMPRESSIONS  1. Left ventricular ejection fraction, by estimation, is 50 to 55%. The left ventricle has low normal function. Left ventricular endocardial border not  optimally defined to evaluate regional wall motion. The left ventricular internal cavity size was moderately dilated. Left ventricular diastolic parameters are indeterminate.  2. Simon ventricular systolic function is normal. The Simon ventricular size is normal. Tricuspid regurgitation signal is inadequate for assessing PA pressure.  3. Left atrial size was mildly dilated.  4. The mitral valve is grossly normal. Trivial mitral valve regurgitation.  5. The aortic valve was not well visualized. Aortic valve regurgitation is not visualized. Mild aortic valve sclerosis is present, with no evidence of aortic valve stenosis.  6. The inferior vena cava is dilated in size with >50% respiratory variability, suggesting Simon atrial pressure of 8 mmHg. FINDINGS  Left Ventricle: Left ventricular ejection fraction, by estimation, is 50 to 55%. The left ventricle has low normal function. Left ventricular endocardial border not optimally defined to evaluate regional wall motion. Definity contrast agent was given IV  to delineate the left ventricular endocardial borders. The left ventricular internal cavity size was moderately dilated. There is borderline left ventricular hypertrophy. Left ventricular diastolic parameters are indeterminate. Simon Ventricle: The Simon ventricular size is normal. No increase in Simon ventricular wall thickness. Simon ventricular systolic function is normal. Tricuspid regurgitation signal is inadequate for assessing PA pressure. Left Atrium: Left atrial size was mildly dilated. Simon Atrium: Simon atrial size was normal in size. Pericardium: There is no evidence of pericardial effusion. Mitral Valve: The mitral valve is grossly normal. Trivial mitral valve regurgitation. Tricuspid  Valve: The tricuspid valve is grossly normal. Tricuspid valve regurgitation is trivial. Aortic Valve: The aortic valve was not well visualized. Aortic valve regurgitation is not visualized. Mild aortic valve sclerosis is  present, with no evidence of aortic valve stenosis. Moderate aortic valve annular calcification. Pulmonic Valve: The pulmonic valve was not well visualized. Pulmonic valve regurgitation is not visualized. Aorta: The aortic root is normal in size and structure. Venous: The inferior vena cava is dilated in size with greater than 50% respiratory variability, suggesting Simon atrial pressure of 8 mmHg. IAS/Shunts: No atrial level shunt detected by color flow Doppler.  LEFT VENTRICLE PLAX 2D LVIDd:         6.40 cm  Diastology LVIDs:         4.50 cm  LV e' lateral:   9.14 cm/s LV PW:         1.10 cm  LV E/e' lateral: 14.6 LV IVS:        1.00 cm  LV e' medial:    7.94 cm/s LVOT diam:     2.00 cm  LV E/e' medial:  16.8 LV SV:         93 LV SV Index:   33 LVOT Area:     3.14 cm  Simon VENTRICLE RV S prime:     17.10 cm/s TAPSE (M-mode): 3.0 cm LEFT ATRIUM              Index       Simon ATRIUM           Index LA diam:        4.70 cm  1.68 cm/m  RA Area:     19.20 cm LA Vol (A2C):   126.0 ml 44.99 ml/m RA Volume:   57.00 ml  20.35 ml/m LA Vol (A4C):   96.7 ml  34.53 ml/m LA Biplane Vol: 110.0 ml 39.28 ml/m  AORTIC VALVE LVOT Vmax:   140.00 cm/s LVOT Vmean:  98.500 cm/s LVOT VTI:    0.295 m  AORTA Ao Root diam: 3.10 cm MITRAL VALVE MV Area (PHT): 3.29 cm     SHUNTS MV Decel Time: 231 msec     Systemic VTI:  0.30 m MV E velocity: 133.00 cm/s  Systemic Diam: 2.00 cm MV A velocity: 76.00 cm/s MV E/A ratio:  1.75 Rozann Lesches MD Electronically signed by Rozann Lesches MD Signature Date/Time: 06/10/2019/7:01:53 PM    Final    VAS [place] LOWER EXTREMITY VENOUS (DVT)  Result Date: 06/10/2019  Lower Venous DVTStudy Indications: Edema, and Elevated D-Dimer, Covid-19.  Limitations: Body habitus and significant edema. Comparison Study: Prior left lower extremity venous duplex from 04/27/16 is                   available for comparison. Performing Technologist: Sharion Dove RVS  Examination Guidelines: A complete evaluation  includes B-mode imaging, spectral Doppler, color Doppler, and power Doppler as needed of all accessible portions of each vessel. Bilateral testing is considered an integral part of a complete examination. Limited examinations for reoccurring indications may be performed as noted. The reflux portion of the exam is performed with the patient in reverse Trendelenburg.  +---------+---------------+---------+-----------+----------+--------------+  Simon     Compressibility Phasicity Spontaneity Properties Thrombus Aging  +---------+---------------+---------+-----------+----------+--------------+  CFV       Full            Yes       Yes                                    +---------+---------------+---------+-----------+----------+--------------+  SFJ       Full                                                             +---------+---------------+---------+-----------+----------+--------------+  FV Prox   Full                                                             +---------+---------------+---------+-----------+----------+--------------+  FV Mid                    Yes       Yes                                    +---------+---------------+---------+-----------+----------+--------------+  FV Distal                 Yes       Yes                                    +---------+---------------+---------+-----------+----------+--------------+  PFV                                                        Not visualized  +---------+---------------+---------+-----------+----------+--------------+  POP       Full            Yes       Yes                                    +---------+---------------+---------+-----------+----------+--------------+  PTV       Full                                                             +---------+---------------+---------+-----------+----------+--------------+  PERO      Full                                                              +---------+---------------+---------+-----------+----------+--------------+   +---------+---------------+---------+-----------+----------+--------------+  LEFT      Compressibility Phasicity Spontaneity Properties Thrombus Aging  +---------+---------------+---------+-----------+----------+--------------+  CFV       Full            Yes       Yes                                    +---------+---------------+---------+-----------+----------+--------------+  FV Prox                                                    Not visualized  +---------+---------------+---------+-----------+----------+--------------+  FV Mid                                                     Not visualized  +---------+---------------+---------+-----------+----------+--------------+  FV Distal                                                  Not visualized  +---------+---------------+---------+-----------+----------+--------------+  PFV                                                        Not visualized  +---------+---------------+---------+-----------+----------+--------------+  POP       Full            Yes       Yes                                    +---------+---------------+---------+-----------+----------+--------------+  PTV       Full                                                             +---------+---------------+---------+-----------+----------+--------------+  PERO      Full                                                             +---------+---------------+---------+-----------+----------+--------------+     Summary: Simon: - There is no evidence of deep vein thrombosis in the lower extremity. However, portions of this examination were limited- see technologist comments above.  LEFT: - Findings appear essentially unchanged compared to previous examination. - There is no evidence of deep vein thrombosis in the lower extremity. However, portions of this examination were limited- see technologist comments above.  *See table(s)  above for measurements and observations. Electronically signed by Ruta Hinds MD on 06/10/2019 at 6:42:32 PM.    Final

## 2019-06-12 LAB — COMPREHENSIVE METABOLIC PANEL
ALT: 21 U/L (ref 0–44)
AST: 16 U/L (ref 15–41)
Albumin: 2.9 g/dL — ABNORMAL LOW (ref 3.5–5.0)
Alkaline Phosphatase: 81 U/L (ref 38–126)
Anion gap: 9 (ref 5–15)
BUN: 18 mg/dL (ref 6–20)
CO2: 39 mmol/L — ABNORMAL HIGH (ref 22–32)
Calcium: 8.4 mg/dL — ABNORMAL LOW (ref 8.9–10.3)
Chloride: 92 mmol/L — ABNORMAL LOW (ref 98–111)
Creatinine, Ser: 0.78 mg/dL (ref 0.61–1.24)
GFR calc Af Amer: >60 mL/min (ref >60)
GFR calc non Af Amer: >60 mL/min (ref >60)
Glucose, Bld: 102 mg/dL — ABNORMAL HIGH (ref 70–99)
Potassium: 4.5 mmol/L (ref 3.5–5.1)
Sodium: 140 mmol/L (ref 135–145)
Total Bilirubin: 0.9 mg/dL (ref 0.3–1.2)
Total Protein: 6.4 g/dL — ABNORMAL LOW (ref 6.5–8.1)

## 2019-06-12 LAB — BRAIN NATRIURETIC PEPTIDE: B Natriuretic Peptide: 117.8 pg/mL — ABNORMAL HIGH (ref 0.0–100.0)

## 2019-06-12 LAB — CBC WITH DIFFERENTIAL/PLATELET
Abs Immature Granulocytes: 0.05 10*3/uL (ref 0.00–0.07)
Basophils Absolute: 0 10*3/uL (ref 0.0–0.1)
Basophils Relative: 0 %
Eosinophils Absolute: 0 10*3/uL (ref 0.0–0.5)
Eosinophils Relative: 0 %
HCT: 43.8 % (ref 39.0–52.0)
Hemoglobin: 13.1 g/dL (ref 13.0–17.0)
Immature Granulocytes: 0 %
Lymphocytes Relative: 9 %
Lymphs Abs: 1.2 10*3/uL (ref 0.7–4.0)
MCH: 29.2 pg (ref 26.0–34.0)
MCHC: 29.9 g/dL — ABNORMAL LOW (ref 30.0–36.0)
MCV: 97.8 fL (ref 80.0–100.0)
Monocytes Absolute: 0.9 10*3/uL (ref 0.1–1.0)
Monocytes Relative: 7 %
Neutro Abs: 10.2 10*3/uL — ABNORMAL HIGH (ref 1.7–7.7)
Neutrophils Relative %: 84 %
Platelets: 215 10*3/uL (ref 150–400)
RBC: 4.48 MIL/uL (ref 4.22–5.81)
RDW: 15.9 % — ABNORMAL HIGH (ref 11.5–15.5)
WBC: 12.4 10*3/uL — ABNORMAL HIGH (ref 4.0–10.5)
nRBC: 0 % (ref 0.0–0.2)

## 2019-06-12 LAB — D-DIMER, QUANTITATIVE: D-Dimer, Quant: 3.37 ug/mL-FEU — ABNORMAL HIGH (ref 0.00–0.50)

## 2019-06-12 LAB — MAGNESIUM: Magnesium: 2.3 mg/dL (ref 1.7–2.4)

## 2019-06-12 LAB — C-REACTIVE PROTEIN: CRP: 1.5 mg/dL — ABNORMAL HIGH (ref <1.0)

## 2019-06-12 MED ORDER — FUROSEMIDE 10 MG/ML IJ SOLN: 40.0000 mg | Freq: Every day | INTRAMUSCULAR | Status: DC

## 2019-06-12 MED ORDER — DEXAMETHASONE SODIUM PHOSPHATE 4 MG/ML IJ SOLN: 2.0000 mg | INTRAMUSCULAR | Status: DC

## 2019-06-12 NOTE — Progress Notes (Signed)
RT NOTE:  Pt has order for CPAP, however, Pt is COVID+

## 2019-06-12 NOTE — Progress Notes (Signed)
PROGRESS NOTE                                                                                                                                                                                                             Patient Demographics:    Jeffery Simon, is a 54 y.o. male, DOB - 15-Oct-1965, VOZ:[id number]  Outpatient Primary MD for the patient is Greig Right, MD    LOS - 3  Admit date - 06/09/2019    Chief Complaint  Patient presents with   Leg Swelling   hypoxia       Brief Narrative - Jeffery Simon is a 54 y.o. male with medical history significant of HTN, obesity, HFpEF grade 2 diastolic dysfunction in 4259.  Pt stable on 55m lasix at home for past 2 years until the past couple of months when he slowly began to have wt gain, worsening peripheral edema, increased SOB, DOE. In the ER diagnosed with CHF + Covid.   Subjective:   Patient in bed, appears comfortable, denies any headache, no fever, no chest pain or pressure, no shortness of breath , no abdominal pain. No focal weakness.    Assessment  & Plan :     1. Acute Hypoxic Resp. Failure due to Acute Covid 19 Viral Pneumonitis + CHF - seems to have moderate disease, placed on IV steroid + IV remdesivir and IV Lasix.  With diuresis and treatment for COVID-19 infection his hypoxia and shortness of breath have improved and he continues to move in the right direction.  Encouraged the patient to sit up in chair in the daytime use I-S and flutter valve for pulmonary toiletry and then prone in bed when at night.  Will advance activity and titrate down oxygen as possible.  SpO2: 95 % O2 Flow Rate (L/min): (S) 15 L/min(15L NRB 6L HFNC per RT and Dr. OMyna Hidalgo- No CPAP on COVID )  Recent Labs  Lab 06/09/19 1415 06/09/19 1543 06/09/19 2002 06/10/19 0500 06/11/19 0440 06/12/19 0427  CRP  --   --  2.2* 1.9* 2.5* 1.5*  DDIMER 3.69*  --   --  3.93* 3.92* 3.37*    BNP 247.5*  --   --  212.2* 175.7* 117.8*  PROCALCITON  --   --  <0.10  --   --   --  SARSCOV2NAA  --  POSITIVE*  --   --   --   --     Hepatic Function Latest Ref Rng & Units 06/12/2019 06/11/2019 06/10/2019  Total Protein 6.5 - 8.1 g/dL 6.4(L) 6.4(L) 7.3  Albumin 3.5 - 5.0 g/dL 2.9(L) 2.9(L) 3.3(L)  AST 15 - 41 U/L 16 27 23   ALT 0 - 44 U/L 21 23 27   Alk Phosphatase 38 - 126 U/L 81 87 103  Total Bilirubin 0.3 - 1.2 mg/dL 0.9 1.1 1.2     2.  Acute on chronic diastolic CHF EF 20% -continue diuresis with IV Lasix, placed on Coreg and ACE inhibitor, echocardiogram noted with preserved EF and no wall motion abnormality.  Will monitor closely with ongoing diuresis.  Total 9 L negative so far.  3.  Morbid obesity BMI 50 changed with OSA.  Follow with PCP for weight loss: BiPAP at at bedtime.  4. High D Dimer - leg ultrasound and CTA negative for any clots, continue moderate dose Lovenox and monitor.    Condition - Fair  Family Communication  :  None  Code Status :  Full  Consults  : Wife [name] [phone]  on 06/11/2019  Procedures  :    CTA - Right-sided pleural effusion and bibasilar patchy opacities which may be related to the underlying clinical history. Mild pulmonary edema. Hilar and mediastinal lymph nodes likely reactive in nature but not significant by size criteria. No evidence of pulmonary embolus  TTE  1. Left ventricular ejection fraction, by estimation, is 50 to 55%. The left ventricle has low normal function. Left ventricular endocardial border not optimally defined to evaluate regional wall motion. The left ventricular internal cavity size was moderately dilated. Left ventricular diastolic parameters are  indeterminate.  2. Right ventricular systolic function is normal. The right ventricular size is normal. Tricuspid regurgitation signal is inadequate for assessing PA pressure.  3. Left atrial size was mildly dilated.  4. The mitral valve is grossly normal.  Trivial mitral valve regurgitation.  5. The aortic valve was not well visualized. Aortic valve regurgitation is not visualized. Mild aortic valve sclerosis is present, with no evidence of aortic valve stenosis.  6. The inferior vena cava is dilated in size with >50% respiratory variability, suggesting right atrial pressure of 8 mmHg.    Leg [place] - No DVT  PUD Prophylaxis : None  Disposition Plan  :    Status is: Observation > Inpt   Dispo: The patient is from: Home              Anticipated d/c is to: Home              Anticipated d/c date is: 1 days, likely home on 06/13/2019 if clinically stable and has finished his treatment              Patient currently is not medically stable to d/c.  Finished treatment for COVID-19 infection.  Being diuresed for CHF with IV Lasix.   DVT Prophylaxis  :  Lovenox   Lab Results  Component Value Date   PLT 215 06/12/2019    Diet :  Diet Order            Diet Heart Room service appropriate? Yes; Fluid consistency: Thin  Diet effective now               Inpatient Medications  Scheduled Meds:  carvedilol  3.125 mg Oral BID WC   dexamethasone (DECADRON) injection  6  mg Intravenous Q24H   enoxaparin (LOVENOX) injection  90 mg Subcutaneous Q24H   fluticasone  1 spray Each Nare Daily   furosemide  60 mg Intravenous Daily   lisinopril  5 mg Oral Daily   multivitamin with minerals  1 tablet Oral Daily   omega-3 acid ethyl esters  1 g Oral Daily   sodium chloride flush  3 mL Intravenous Once   spironolactone  25 mg Oral Daily   Continuous Infusions:  remdesivir 100 mg in NS 100 mL 100 mg (06/11/19 1621)   PRN Meds:.acetaminophen, albuterol, [DISCONTINUED] ondansetron **OR** ondansetron (ZOFRAN) IV  Antibiotics  :    Anti-infectives (From admission, onward)   Start     Dose/Rate Route Frequency Ordered Stop   06/10/19 1600  remdesivir 100 mg in sodium chloride 0.9 % 100 mL IVPB     100 mg 200 mL/hr over 30 Minutes  Intravenous Daily 06/09/19 1951 06/14/19 1559   06/09/19 2000  remdesivir 200 mg in sodium chloride 0.9% 250 mL IVPB     200 mg 580 mL/hr over 30 Minutes Intravenous Once 06/09/19 1951 06/09/19 2120       Time Spent in minutes  30   Lala Lund M.D on 06/12/2019 at 9:37 AM  To page go to [email or web address] - password [facility]  [facility] -  Office  [phone]   See all Orders from today for further details    Objective:   Vitals:   06/11/19 0812 06/11/19 1200 06/11/19 2023 06/12/19 0443  BP: 120/72  134/78 131/79  Pulse: 85  80 68  Resp: 16  18 19   Temp: 97.9 F (36.6 C) 97.8 F (36.6 C) 98.5 F (36.9 C) 97.9 F (36.6 C)  TempSrc: Oral Oral Oral Axillary  SpO2: 93%  98% 95%  Weight:    (!) 181.8 kg  Height:        Wt Readings from Last 3 Encounters:  06/12/19 (!) 181.8 kg  08/01/18 (!) 176 kg  06/01/17 (!) 173.7 kg     Intake/Output Summary (Last 24 hours) at 06/12/2019 0937 Last data filed at 06/12/2019 0930 Gross per 24 hour  Intake 1500 ml  Output 2400 ml  Net -900 ml     Physical Exam  Awake Alert, No new F.N deficits, Normal affect [place].AT,PERRAL Supple Neck,No JVD, No cervical lymphadenopathy appriciated.  Symmetrical Chest wall movement, Good air movement bilaterally, CTAB RRR,No Gallops, Rubs or new Murmurs, No Parasternal Heave +ve B.Sounds, Abd Soft, No tenderness, No organomegaly appriciated, No rebound - guarding or rigidity. No Cyanosis, Clubbing, trace leg edema    Data Review:    CBC Recent Labs  Lab 06/09/19 1415 06/10/19 0500 06/11/19 0440 06/12/19 0427  WBC 9.3 10.0 11.0* 12.4*  HGB 12.9* 12.9* 12.8* 13.1  HCT 44.5 43.8 43.1 43.8  PLT 216 195 179 215  MCV 98.9 99.3 97.1 97.8  MCH 28.7 29.3 28.8 29.2  MCHC 29.0* 29.5* 29.7* 29.9*  RDW 16.8* 16.4* 15.9* 15.9*  LYMPHSABS  --  0.9 0.7 1.2  MONOABS  --  0.7 0.6 0.9  EOSABS  --  0.2 0.0 0.0  BASOSABS  --  0.0 0.0 0.0    Chemistries  Recent Labs  Lab 06/09/19 1415  06/10/19 0500 06/11/19 0440 06/12/19 0427  NA 143 142 140 140  K 4.1 4.2 4.9 4.5  CL 100 94* 96* 92*  CO2 34* 38* 36* 39*  GLUCOSE 104* 99 116* 102*  BUN 17 14 16 18   CREATININE 0.92  1.00 0.77 0.78  CALCIUM 8.4* 8.6* 8.3* 8.4*  AST  --  23 27 16   ALT  --  27 23 21   ALKPHOS  --  103 87 81  BILITOT  --  1.2 1.1 0.9  MG  --  2.1 2.2 2.3     ------------------------------------------------------------------------------------------------------------------ No results for input(s): CHOL, HDL, LDLCALC, TRIG, CHOLHDL, LDLDIRECT in the last 72 hours.  Lab Results  Component Value Date   HGBA1C 5.5 05/31/2017   ------------------------------------------------------------------------------------------------------------------ No results for input(s): TSH, T4TOTAL, T3FREE, THYROIDAB in the last 72 hours.  Invalid input(s): FREET3  Cardiac Enzymes No results for input(s): CKMB, TROPONINI, MYOGLOBIN in the last 168 hours.  Invalid input(s): CK ------------------------------------------------------------------------------------------------------------------    Component Value Date/Time   BNP 117.8 (H) 06/12/2019 0427    Micro Results Recent Results (from the past 240 hour(s))  SARS Coronavirus 2 by RT PCR (hospital order, performed in [facility] hospital lab) Nasopharyngeal Nasopharyngeal Swab     Status: Abnormal   Collection Time: 06/09/19  3:43 PM   Specimen: Nasopharyngeal Swab  Result Value Ref Range Status   SARS Coronavirus 2 POSITIVE (A) NEGATIVE Final    Comment: RESULT CALLED TO, READ BACK BY AND VERIFIED WITH: RN Arvella Merles [id number] AT [id number] BY CM (NOTE) SARS-CoV-2 target nucleic acids are DETECTED SARS-CoV-2 RNA is generally detectable in upper respiratory specimens  during the acute phase of infection.  Positive results are indicative  of the presence of the identified virus, but do not rule out bacterial infection or co-infection with other pathogens  not detected by the test.  Clinical correlation with patient history and  other diagnostic information is necessary to determine patient infection status.  The expected result is negative. Fact Sheet for Patients:   StrictlyIdeas.no  Fact Sheet for Healthcare Providers:   BankingDealers.co.za   This test is not yet approved or cleared by the [place] FDA and  has been authorized for detection and/or diagnosis of SARS-CoV-2 by FDA under an Emergency Use Authorization (EUA).  This EUA will remain in effect (meaning this test  can be used) for the duration of  the COVID-19 declaration under Section 564(b)(1) of the Act, 21 U.S.C. section 360-bbb-3(b)(1), unless the authorization is terminated or revoked sooner. Performed at [facility], [place] [address] [place], [place] [address]   MRSA PCR Screening     Status: None   Collection Time: 06/11/19 10:23 AM   Specimen: Nasal Mucosa; Nasopharyngeal  Result Value Ref Range Status   MRSA by PCR NEGATIVE NEGATIVE Final    Comment:        The GeneXpert MRSA Assay (FDA approved for NASAL specimens only), is one component of a comprehensive MRSA colonization surveillance program. It is not intended to diagnose MRSA infection nor to guide or monitor treatment for MRSA infections. Performed at [facility], [place] [address] [place], [place] [address]     Radiology Reports DG Chest 2 View  Result Date: 06/09/2019 CLINICAL DATA:  Shortness of breath for 1 week with lower extremity swelling, initial encounter EXAM: CHEST - 2 VIEW COMPARISON:  05/30/2017 FINDINGS: Cardiac shadow is mildly prominent. Vascular congestion is noted with mild pulmonary edema consistent with CHF. Small right-sided pleural effusion is noted. Some right basilar atelectasis/early infiltrate is also noted. No bony abnormality is noted. IMPRESSION: Changes of CHF with right basilar infiltrate/atelectasis.  Electronically Signed   By: Inez Catalina M.D.   On: 06/09/2019 14:50   CT Angio Chest  PE W and/or Wo Contrast  Result Date: 06/09/2019 CLINICAL DATA:  History of COVID-19 positivity with chest pain and lower extremity swelling EXAM: CT ANGIOGRAPHY CHEST WITH CONTRAST TECHNIQUE: Multidetector CT imaging of the chest was performed using the standard protocol during bolus administration of intravenous contrast. Multiplanar CT image reconstructions and MIPs were obtained to evaluate the vascular anatomy. CONTRAST:  156m OMNIPAQUE IOHEXOL 350 MG/ML SOLN COMPARISON:  None. FINDINGS: Cardiovascular: Thoracic aorta shows no aneurysmal dilatation. No cardiac enlargement is seen. Pulmonary artery opacification is somewhat limited due to timing of the contrast bolus although no pulmonary embolus is noted. Mediastinum/Nodes: Thoracic inlet is within normal limits. Scattered small hilar and mediastinal lymph nodes are noted likely reactive in nature. The esophagus is within normal limits as visualized. Lungs/Pleura: Moderate right-sided pleural effusion is noted. Mild interstitial edema is seen. Patchy infiltrate is noted in the bases bilaterally. This may be related to the known COVID-19 infection. No sizable parenchymal nodules are noted. Upper Abdomen: Visualized upper abdomen is within normal limits. Musculoskeletal: Degenerative changes of the thoracic spine are seen. Review of the MIP images confirms the above findings. IMPRESSION: Right-sided pleural effusion and bibasilar patchy opacities which may be related to the underlying clinical history. Mild pulmonary edema. Hilar and mediastinal lymph nodes likely reactive in nature but not significant by size criteria. No evidence of pulmonary embolus. Electronically Signed   By: MInez CatalinaM.D.   On: 06/09/2019 17:54   ECHOCARDIOGRAM COMPLETE  Result Date: 06/10/2019    ECHOCARDIOGRAM REPORT   Patient Name:   HHAWKIN CHARODate of Exam: 06/10/2019 Medical Rec #:   [id number]       Height:       71.0 in Accession #:    [id number]      Weight:       390.0 lb Date of Birth:  212-23-1967       BSA:          2.801 m Patient Age:    89years         BP:           126/65 mmHg Patient Gender: M                HR:           82 bpm. Exam Location:  Inpatient Procedure: 2D Echo, Cardiac Doppler and Color Doppler Indications:    CHF-Acute Diastolic  History:        Patient has prior history of Echocardiogram examinations, most                 recent 06/01/2017. Risk Factors:Hypertension. Morbid Obesity,                 Covid-19,OSA (obstructive sleep apnea).  Sonographer:    BAlvino ChapelRCS Referring Phys: [phone] [id number] M GARDNER  Sonographer Comments: Technically difficult study due to poor echo windows. IMPRESSIONS  1. Left ventricular ejection fraction, by estimation, is 50 to 55%. The left ventricle has low normal function. Left ventricular endocardial border not optimally defined to evaluate regional wall motion. The left ventricular internal cavity size was moderately dilated. Left ventricular diastolic parameters are indeterminate.  2. Right ventricular systolic function is normal. The right ventricular size is normal. Tricuspid regurgitation signal is inadequate for assessing PA pressure.  3. Left atrial size was mildly dilated.  4. The mitral valve is grossly normal. Trivial mitral valve regurgitation.  5. The aortic valve was not well  visualized. Aortic valve regurgitation is not visualized. Mild aortic valve sclerosis is present, with no evidence of aortic valve stenosis.  6. The inferior vena cava is dilated in size with >50% respiratory variability, suggesting right atrial pressure of 8 mmHg. FINDINGS  Left Ventricle: Left ventricular ejection fraction, by estimation, is 50 to 55%. The left ventricle has low normal function. Left ventricular endocardial border not optimally defined to evaluate regional wall motion. Definity contrast agent was given IV  to delineate the left  ventricular endocardial borders. The left ventricular internal cavity size was moderately dilated. There is borderline left ventricular hypertrophy. Left ventricular diastolic parameters are indeterminate. Right Ventricle: The right ventricular size is normal. No increase in right ventricular wall thickness. Right ventricular systolic function is normal. Tricuspid regurgitation signal is inadequate for assessing PA pressure. Left Atrium: Left atrial size was mildly dilated. Right Atrium: Right atrial size was normal in size. Pericardium: There is no evidence of pericardial effusion. Mitral Valve: The mitral valve is grossly normal. Trivial mitral valve regurgitation. Tricuspid Valve: The tricuspid valve is grossly normal. Tricuspid valve regurgitation is trivial. Aortic Valve: The aortic valve was not well visualized. Aortic valve regurgitation is not visualized. Mild aortic valve sclerosis is present, with no evidence of aortic valve stenosis. Moderate aortic valve annular calcification. Pulmonic Valve: The pulmonic valve was not well visualized. Pulmonic valve regurgitation is not visualized. Aorta: The aortic root is normal in size and structure. Venous: The inferior vena cava is dilated in size with greater than 50% respiratory variability, suggesting right atrial pressure of 8 mmHg. IAS/Shunts: No atrial level shunt detected by color flow Doppler.  LEFT VENTRICLE PLAX 2D LVIDd:         6.40 cm  Diastology LVIDs:         4.50 cm  LV e' lateral:   9.14 cm/s LV PW:         1.10 cm  LV E/e' lateral: 14.6 LV IVS:        1.00 cm  LV e' medial:    7.94 cm/s LVOT diam:     2.00 cm  LV E/e' medial:  16.8 LV SV:         93 LV SV Index:   33 LVOT Area:     3.14 cm  RIGHT VENTRICLE RV S prime:     17.10 cm/s TAPSE (M-mode): 3.0 cm LEFT ATRIUM              Index       RIGHT ATRIUM           Index LA diam:        4.70 cm  1.68 cm/m  RA Area:     19.20 cm LA Vol (A2C):   126.0 ml 44.99 ml/m RA Volume:   57.00 ml  20.35  ml/m LA Vol (A4C):   96.7 ml  34.53 ml/m LA Biplane Vol: 110.0 ml 39.28 ml/m  AORTIC VALVE LVOT Vmax:   140.00 cm/s LVOT Vmean:  98.500 cm/s LVOT VTI:    0.295 m  AORTA Ao Root diam: 3.10 cm MITRAL VALVE MV Area (PHT): 3.29 cm     SHUNTS MV Decel Time: 231 msec     Systemic VTI:  0.30 m MV E velocity: 133.00 cm/s  Systemic Diam: 2.00 cm MV A velocity: 76.00 cm/s MV E/A ratio:  1.75 Rozann Lesches MD Electronically signed by Rozann Lesches MD Signature Date/Time: 06/10/2019/7:01:53 PM    Final    VAS [place] LOWER  EXTREMITY VENOUS (DVT)  Result Date: 06/10/2019  Lower Venous DVTStudy Indications: Edema, and Elevated D-Dimer, Covid-19.  Limitations: Body habitus and significant edema. Comparison Study: Prior left lower extremity venous duplex from 04/27/16 is                   available for comparison. Performing Technologist: Sharion Dove RVS  Examination Guidelines: A complete evaluation includes B-mode imaging, spectral Doppler, color Doppler, and power Doppler as needed of all accessible portions of each vessel. Bilateral testing is considered an integral part of a complete examination. Limited examinations for reoccurring indications may be performed as noted. The reflux portion of the exam is performed with the patient in reverse Trendelenburg.  +---------+---------------+---------+-----------+----------+--------------+  RIGHT     Compressibility Phasicity Spontaneity Properties Thrombus Aging  +---------+---------------+---------+-----------+----------+--------------+  CFV       Full            Yes       Yes                                    +---------+---------------+---------+-----------+----------+--------------+  SFJ       Full                                                             +---------+---------------+---------+-----------+----------+--------------+  FV Prox   Full                                                              +---------+---------------+---------+-----------+----------+--------------+  FV Mid                    Yes       Yes                                    +---------+---------------+---------+-----------+----------+--------------+  FV Distal                 Yes       Yes                                    +---------+---------------+---------+-----------+----------+--------------+  PFV                                                        Not visualized  +---------+---------------+---------+-----------+----------+--------------+  POP       Full            Yes       Yes                                    +---------+---------------+---------+-----------+----------+--------------+  PTV  Full                                                             +---------+---------------+---------+-----------+----------+--------------+  PERO      Full                                                             +---------+---------------+---------+-----------+----------+--------------+   +---------+---------------+---------+-----------+----------+--------------+  LEFT      Compressibility Phasicity Spontaneity Properties Thrombus Aging  +---------+---------------+---------+-----------+----------+--------------+  CFV       Full            Yes       Yes                                    +---------+---------------+---------+-----------+----------+--------------+  FV Prox                                                    Not visualized  +---------+---------------+---------+-----------+----------+--------------+  FV Mid                                                     Not visualized  +---------+---------------+---------+-----------+----------+--------------+  FV Distal                                                  Not visualized  +---------+---------------+---------+-----------+----------+--------------+  PFV                                                        Not visualized   +---------+---------------+---------+-----------+----------+--------------+  POP       Full            Yes       Yes                                    +---------+---------------+---------+-----------+----------+--------------+  PTV       Full                                                             +---------+---------------+---------+-----------+----------+--------------+  PERO      Full                                                             +---------+---------------+---------+-----------+----------+--------------+  Summary: RIGHT: - There is no evidence of deep vein thrombosis in the lower extremity. However, portions of this examination were limited- see technologist comments above.  LEFT: - Findings appear essentially unchanged compared to previous examination. - There is no evidence of deep vein thrombosis in the lower extremity. However, portions of this examination were limited- see technologist comments above.  *See table(s) above for measurements and observations. Electronically signed by Ruta Hinds MD on 06/10/2019 at 6:42:32 PM.    Final

## 2019-06-13 DIAGNOSIS — U071 COVID-19: Principal | ICD-10-CM

## 2019-06-13 DIAGNOSIS — J9601 Acute respiratory failure with hypoxia: Secondary | ICD-10-CM

## 2019-06-13 DIAGNOSIS — I5033 Acute on chronic diastolic (congestive) heart failure: Secondary | ICD-10-CM

## 2019-06-13 LAB — CBC WITH DIFFERENTIAL/PLATELET
Abs Immature Granulocytes: 0.04 10*3/uL (ref 0.00–0.07)
Basophils Absolute: 0 10*3/uL (ref 0.0–0.1)
Basophils Relative: 0 %
Eosinophils Absolute: 0.1 10*3/uL (ref 0.0–0.5)
Eosinophils Relative: 1 %
HCT: 47 % (ref 39.0–52.0)
Hemoglobin: 13.9 g/dL (ref 13.0–17.0)
Immature Granulocytes: 0 %
Lymphocytes Relative: 13 %
Lymphs Abs: 1.4 10*3/uL (ref 0.7–4.0)
MCH: 29.1 pg (ref 26.0–34.0)
MCHC: 29.6 g/dL — ABNORMAL LOW (ref 30.0–36.0)
MCV: 98.3 fL (ref 80.0–100.0)
Monocytes Absolute: 0.6 10*3/uL (ref 0.1–1.0)
Monocytes Relative: 6 %
Neutro Abs: 8.7 10*3/uL — ABNORMAL HIGH (ref 1.7–7.7)
Neutrophils Relative %: 80 %
Platelets: 208 10*3/uL (ref 150–400)
RBC: 4.78 MIL/uL (ref 4.22–5.81)
RDW: 15.9 % — ABNORMAL HIGH (ref 11.5–15.5)
WBC: 10.8 10*3/uL — ABNORMAL HIGH (ref 4.0–10.5)
nRBC: 0 % (ref 0.0–0.2)

## 2019-06-13 LAB — BRAIN NATRIURETIC PEPTIDE: B Natriuretic Peptide: 110.4 pg/mL — ABNORMAL HIGH (ref 0.0–100.0)

## 2019-06-13 LAB — MAGNESIUM: Magnesium: 2.4 mg/dL (ref 1.7–2.4)

## 2019-06-13 LAB — COMPREHENSIVE METABOLIC PANEL
ALT: 26 U/L (ref 0–44)
AST: 25 U/L (ref 15–41)
Albumin: 3.3 g/dL — ABNORMAL LOW (ref 3.5–5.0)
Alkaline Phosphatase: 84 U/L (ref 38–126)
Anion gap: 11 (ref 5–15)
BUN: 20 mg/dL (ref 6–20)
CO2: 37 mmol/L — ABNORMAL HIGH (ref 22–32)
Calcium: 8.6 mg/dL — ABNORMAL LOW (ref 8.9–10.3)
Chloride: 91 mmol/L — ABNORMAL LOW (ref 98–111)
Creatinine, Ser: 0.79 mg/dL (ref 0.61–1.24)
GFR calc Af Amer: >60 mL/min (ref >60)
GFR calc non Af Amer: >60 mL/min (ref >60)
Glucose, Bld: 87 mg/dL (ref 70–99)
Potassium: 4.5 mmol/L (ref 3.5–5.1)
Sodium: 139 mmol/L (ref 135–145)
Total Bilirubin: 1.1 mg/dL (ref 0.3–1.2)
Total Protein: 7.2 g/dL (ref 6.5–8.1)

## 2019-06-13 LAB — D-DIMER, QUANTITATIVE: D-Dimer, Quant: 5.08 ug/mL-FEU — ABNORMAL HIGH (ref 0.00–0.50)

## 2019-06-13 LAB — C-REACTIVE PROTEIN: CRP: 0.9 mg/dL (ref <1.0)

## 2019-06-13 MED ORDER — DEXAMETHASONE SODIUM PHOSPHATE 4 MG/ML IJ SOLN
2.0000 mg | INTRAMUSCULAR | Status: DC
  Administered 2019-06-13 – 2019-06-14 (×2): 2 mg via INTRAVENOUS
  Filled 2019-06-13 (×2): qty 1

## 2019-06-13 MED ORDER — FUROSEMIDE 10 MG/ML IJ SOLN: 80.0000 mg | Freq: Once | INTRAMUSCULAR | Status: DC

## 2019-06-13 MED ORDER — DEXAMETHASONE SODIUM PHOSPHATE 10 MG/ML IJ SOLN: 10.0000 mg | INTRAMUSCULAR | Status: DC

## 2019-06-13 MED ORDER — FUROSEMIDE 10 MG/ML IJ SOLN
40.0000 mg | Freq: Every day | INTRAMUSCULAR | Status: DC
  Administered 2019-06-13 – 2019-06-14 (×2): 40 mg via INTRAVENOUS
  Filled 2019-06-13 (×2): qty 4

## 2019-06-13 NOTE — Progress Notes (Signed)
Pt ambulated about 400 ft in the hallway. Pt was on 2L via [place] at rest and satting at 96%. I took him off of the nasal cannula and he was at 93% on room air, at rest. I started off ambulating him without oxygen and he dropped down to 86%, so he was put on 2L. He was satting around 88% on 2L, and was bumped up to 3L and was at 91-92%. The patient tolerated it well. He was returned to the chair on 2L and at 94%.

## 2019-06-13 NOTE — Progress Notes (Signed)
PROGRESS NOTE                                                                                                                                                                                                             Patient Demographics:    Jeffery Simon, is a 54 y.o. male, DOB - 04/03/1965, EPP:[id number]  Outpatient Primary MD for the patient is Jeffery Right, MD    LOS - 4  Admit date - 06/09/2019    Chief Complaint  Patient presents with  . Leg Swelling  . hypoxia       Brief Narrative   - Jeffery Simon is a 54 y.o. male with medical history significant of HTN, obesity, HFpEF grade 2 diastolic dysfunction in 6063.  Pt stable on 72m lasix at home for past 2 years until the past couple of months when he slowly began to have wt gain, worsening peripheral edema, increased SOB, DOE. In the ER diagnosed with CHF + Covid.   Subjective:   Patient in bed, appears comfortable, denies headache, fever or chills, no dyspnea.   Assessment  & Plan :    Acute Hypoxic Resp. Failure due to Acute Covid 19 Viral Pneumonitis + CHF  - seems to have moderate disease, placed on IV steroid + IV remdesivir and IV Lasix.  With diuresis and treatment for COVID-19 infection his hypoxia and shortness of breath have improved and he continues to move in the Simon direction. -Patient with increased oxygen requirement overnight, but this is mainly due to obstructive sleep apnea, and inability to use CPAP due to [id number] policy.  Encouraged the patient to sit up in chair in the daytime use I-S and flutter valve for pulmonary toiletry and then prone in bed when at night.  Will advance activity and titrate down oxygen as possible.  SpO2: 99 % O2 Flow Rate (L/min): 15 L/min  Recent Labs  Lab 06/09/19 1415 06/09/19 1543 06/09/19 2002 06/10/19 0500 06/11/19 0440 06/12/19 0427 06/13/19 0909  CRP  --   --  2.2* 1.9* 2.5* 1.5* 0.9   DDIMER 3.69*  --   --  3.93* 3.92* 3.37* 5.08*  BNP 247.5*  --   --  212.2* 175.7* 117.8* 110.4*  PROCALCITON  --   --  <0.10  --   --   --   --  SARSCOV2NAA  --  POSITIVE*  --   --   --   --   --     Hepatic Function Latest Ref Rng & Units 06/13/2019 06/12/2019 06/11/2019  Total Protein 6.5 - 8.1 g/dL 7.2 6.4(L) 6.4(L)  Albumin 3.5 - 5.0 g/dL 3.3(L) 2.9(L) 2.9(L)  AST 15 - 41 U/L _0 ALT 0 - 44 U/L _1 Alk Phosphatase 38 - 126 U/L 84 81 87  Total Bilirubin 0.3 - 1.2 mg/dL 1.1 0.9 1.1     Acute on chronic diastolic CHF EF 31%  -Continue with IV diuresis, daily weight, strict ins and outs, far he is -16 L since admission, but still appears to be significantly volume overload, continue with current  dose Lasix . -, placed on Coreg and ACE inhibitor, echocardiogram noted with preserved EF and no wall motion abnormality.  Will monitor closely with ongoing diuresis.  Morbid obesity BMI 50 changed with OSA.   -CPAP at nighttime at home, during hospital stay he is on high flow nasal cannula .  High D Dimer  - leg ultrasound and CTA negative for any clots, continue moderate dose Lovenox and monitor.    Condition - Fair  Family Communication  :  Wife Jeffery Simon [phone]  on 06/11/2019  Code Status :  Full  Consults  : None  Procedures  :    CTA - Simon-sided pleural effusion and bibasilar patchy opacities which may be related to the underlying clinical history. Mild pulmonary edema. Hilar and mediastinal lymph nodes likely reactive in nature but not significant by size criteria. No evidence of pulmonary embolus  TTE  1. Left ventricular ejection fraction, by estimation, is 50 to 55%. The left ventricle has low normal function. Left ventricular endocardial border not optimally defined to evaluate regional wall motion. The left ventricular internal cavity size was moderately dilated. Left ventricular diastolic parameters are  indeterminate.  2. Simon ventricular systolic  function is normal. The Simon ventricular size is normal. Tricuspid regurgitation signal is inadequate for assessing PA pressure.  3. Left atrial size was mildly dilated.  4. The mitral valve is grossly normal. Trivial mitral valve regurgitation.  5. The aortic valve was not well visualized. Aortic valve regurgitation is not visualized. Mild aortic valve sclerosis is present, with no evidence of aortic valve stenosis.  6. The inferior vena cava is dilated in size with >50% respiratory variability, suggesting Simon atrial pressure of 8 mmHg.    Leg [place] - No DVT  PUD Prophylaxis : None  Disposition Plan  :    Status is: Observation > Inpt   Dispo: The patient is from: Home              Anticipated d/c is to: Home              Anticipated d/c date is: 1 days, likely home on 06/13/2019 if clinically stable and has finished his treatment              Patient currently is not medically stable to d/c.  Finished treatment for COVID-19 infection.  Being diuresed for CHF with IV Lasix.   DVT Prophylaxis  :  Lovenox   Lab Results  Component Value Date   PLT 208 06/13/2019    Diet :  Diet Order            Diet Heart Room service appropriate? Yes; Fluid consistency: Thin  Diet effective now  Inpatient Medications  Scheduled Meds: . carvedilol  3.125 mg Oral BID WC  . dexamethasone (DECADRON) injection  2 mg Intravenous Q24H  . enoxaparin (LOVENOX) injection  90 mg Subcutaneous Q24H  . fluticasone  1 spray Each Nare Daily  . furosemide  40 mg Intravenous Daily  . lisinopril  5 mg Oral Daily  . multivitamin with minerals  1 tablet Oral Daily  . omega-3 acid ethyl esters  1 g Oral Daily  . sodium chloride flush  3 mL Intravenous Once  . spironolactone  25 mg Oral Daily   Continuous Infusions: . remdesivir 100 mg in NS 100 mL 100 mg (06/12/19 1532)   PRN Meds:.acetaminophen, albuterol, [DISCONTINUED] ondansetron **OR** ondansetron (ZOFRAN) IV  Antibiotics  :      Anti-infectives (From admission, onward)   Start     Dose/Rate Route Frequency Ordered Stop   06/10/19 1600  remdesivir 100 mg in sodium chloride 0.9 % 100 mL IVPB     100 mg 200 mL/hr over 30 Minutes Intravenous Daily 06/09/19 1951 06/14/19 1559   06/09/19 2000  remdesivir 200 mg in sodium chloride 0.9% 250 mL IVPB     200 mg 580 mL/hr over 30 Minutes Intravenous Once 06/09/19 1951 06/09/19 2120       Time Spent in minutes  35   Phillips Climes M.D on 06/13/2019 at 12:26 PM  To page go to [email or web address] - password [email or web address]  [facility] -  Office  [phone]   See all Orders from today for further details    Objective:   Vitals:   06/12/19 1229 06/12/19 1910 06/12/19 2144 06/13/19 0500  BP: 125/83 (!) 154/76  139/84  Pulse: 66 80 71 72  Resp: _0 Temp: 98.5 F (36.9 C) 98.2 F (36.8 C)  98 F (36.7 C)  TempSrc: Oral Oral  Oral  SpO2: 91% 96% 98% 99%  Weight:    (!) 179.5 kg  Height:        Wt Readings from Last 3 Encounters:  06/13/19 (!) 179.5 kg  08/01/18 (!) 176 kg  06/01/17 (!) 173.7 kg     Intake/Output Summary (Last 24 hours) at 06/13/2019 1226 Last data filed at 06/13/2019 1134 Gross per 24 hour  Intake 1374 ml  Output 6110 ml  Net -4736 ml     Physical Exam  Awake Alert, Oriented X 3, No new F.N deficits, Normal affect Symmetrical Chest wall movement, Good air movement bilaterally, CTAB RRR,No Gallops,Rubs or new Murmurs, No Parasternal Heave +ve B.Sounds, Abd Soft, No tenderness, No rebound - guarding or rigidity. No Cyanosis, Clubbing , has trace lower extremity edema    Data Review:    CBC Recent Labs  Lab 06/09/19 1415 06/10/19 0500 06/11/19 0440 06/12/19 0427 06/13/19 0909  WBC 9.3 10.0 11.0* 12.4* 10.8*  HGB 12.9* 12.9* 12.8* 13.1 13.9  HCT 44.5 43.8 43.1 43.8 47.0  PLT 216 195 179 215 208  MCV 98.9 99.3 97.1 97.8 98.3  MCH 28.7 29.3 28.8 29.2 29.1  MCHC 29.0* 29.5* 29.7* 29.9* 29.6*  RDW 16.8* 16.4*  15.9* 15.9* 15.9*  LYMPHSABS  --  0.9 0.7 1.2 1.4  MONOABS  --  0.7 0.6 0.9 0.6  EOSABS  --  0.2 0.0 0.0 0.1  BASOSABS  --  0.0 0.0 0.0 0.0    Chemistries  Recent Labs  Lab 06/09/19 1415 06/10/19 0500 06/11/19 0440 06/12/19 0427 06/13/19 0909  NA 143 142 140 140 139  K 4.1 4.2  4.9 4.5 4.5  CL 100 94* 96* 92* 91*  CO2 34* 38* 36* 39* 37*  GLUCOSE 104* 99 116* 102* 87  BUN _0 CREATININE 0.92 1.00 0.77 0.78 0.79  CALCIUM 8.4* 8.6* 8.3* 8.4* 8.6*  AST  --  _1 ALT  --  _2 ALKPHOS  --  103 87 81 84  BILITOT  --  1.2 1.1 0.9 1.1  MG  --  2.1 2.2 2.3 2.4     ------------------------------------------------------------------------------------------------------------------ No results for input(s): CHOL, HDL, LDLCALC, TRIG, CHOLHDL, LDLDIRECT in the last 72 hours.  Lab Results  Component Value Date   HGBA1C 5.5 05/31/2017   ------------------------------------------------------------------------------------------------------------------ No results for input(s): TSH, T4TOTAL, T3FREE, THYROIDAB in the last 72 hours.  Invalid input(s): FREET3  Cardiac Enzymes No results for input(s): CKMB, TROPONINI, MYOGLOBIN in the last 168 hours.  Invalid input(s): CK ------------------------------------------------------------------------------------------------------------------    Component Value Date/Time   BNP 110.4 (H) 06/13/2019 2878    Micro Results Recent Results (from the past 240 hour(s))  SARS Coronavirus 2 by RT PCR (hospital order, performed in [facility] hospital lab) Nasopharyngeal Nasopharyngeal Swab     Status: Abnormal   Collection Time: 06/09/19  3:43 PM   Specimen: Nasopharyngeal Swab  Result Value Ref Range Status   SARS Coronavirus 2 POSITIVE (A) NEGATIVE Final    Comment: RESULT CALLED TO, READ BACK BY AND VERIFIED WITH: RN Arvella Merles [id number] AT [id number] BY CM (NOTE) SARS-CoV-2 target nucleic acids are DETECTED SARS-CoV-2  RNA is generally detectable in upper respiratory specimens  during the acute phase of infection.  Positive results are indicative  of the presence of the identified virus, but do not rule out bacterial infection or co-infection with other pathogens not detected by the test.  Clinical correlation with patient history and  other diagnostic information is necessary to determine patient infection status.  The expected result is negative. Fact Sheet for Patients:   StrictlyIdeas.no  Fact Sheet for Healthcare Providers:   BankingDealers.co.za   This test is not yet approved or cleared by the [place] FDA and  has been authorized for detection and/or diagnosis of SARS-CoV-2 by FDA under an Emergency Use Authorization (EUA).  This EUA will remain in effect (meaning this test  can be used) for the duration of  the COVID-19 declaration under Section 564(b)(1) of the Act, 21 U.S.C. section 360-bbb-3(b)(1), unless the authorization is terminated or revoked sooner. Performed at [facility], [place] [address], [place], [place] [address]   MRSA PCR Screening     Status: None   Collection Time: 06/11/19 10:23 AM   Specimen: Nasal Mucosa; Nasopharyngeal  Result Value Ref Range Status   MRSA by PCR NEGATIVE NEGATIVE Final    Comment:        The GeneXpert MRSA Assay (FDA approved for NASAL specimens only), is one component of a comprehensive MRSA colonization surveillance program. It is not intended to diagnose MRSA infection nor to guide or monitor treatment for MRSA infections. Performed at [facility], [place] [address] [place], [place] [address]     Radiology Reports DG Chest 2 View  Result Date: 06/09/2019 CLINICAL DATA:  Shortness of breath for 1 week with lower extremity swelling, initial encounter EXAM: CHEST - 2 VIEW COMPARISON:  05/30/2017 FINDINGS: Cardiac shadow is mildly prominent. Vascular congestion is noted with mild  pulmonary edema consistent with CHF. Small Simon-sided pleural effusion is  noted. Some Simon basilar atelectasis/early infiltrate is also noted. No bony abnormality is noted. IMPRESSION: Changes of CHF with Simon basilar infiltrate/atelectasis. Electronically Signed   By: Inez Catalina M.D.   On: 06/09/2019 14:50   CT Angio Chest PE W and/or Wo Contrast  Result Date: 06/09/2019 CLINICAL DATA:  History of COVID-19 positivity with chest pain and lower extremity swelling EXAM: CT ANGIOGRAPHY CHEST WITH CONTRAST TECHNIQUE: Multidetector CT imaging of the chest was performed using the standard protocol during bolus administration of intravenous contrast. Multiplanar CT image reconstructions and MIPs were obtained to evaluate the vascular anatomy. CONTRAST:  144m OMNIPAQUE IOHEXOL 350 MG/ML SOLN COMPARISON:  None. FINDINGS: Cardiovascular: Thoracic aorta shows no aneurysmal dilatation. No cardiac enlargement is seen. Pulmonary artery opacification is somewhat limited due to timing of the contrast bolus although no pulmonary embolus is noted. Mediastinum/Nodes: Thoracic inlet is within normal limits. Scattered small hilar and mediastinal lymph nodes are noted likely reactive in nature. The esophagus is within normal limits as visualized. Lungs/Pleura: Moderate Simon-sided pleural effusion is noted. Mild interstitial edema is seen. Patchy infiltrate is noted in the bases bilaterally. This may be related to the known COVID-19 infection. No sizable parenchymal nodules are noted. Upper Abdomen: Visualized upper abdomen is within normal limits. Musculoskeletal: Degenerative changes of the thoracic spine are seen. Review of the MIP images confirms the above findings. IMPRESSION: Simon-sided pleural effusion and bibasilar patchy opacities which may be related to the underlying clinical history. Mild pulmonary edema. Hilar and mediastinal lymph nodes likely reactive in nature but not significant by size criteria. No evidence  of pulmonary embolus. Electronically Signed   By: MInez CatalinaM.D.   On: 06/09/2019 17:54   ECHOCARDIOGRAM COMPLETE  Result Date: 06/10/2019    ECHOCARDIOGRAM REPORT   Patient Name:   HLERRY CORDREYDate of Exam: 06/10/2019 Medical Rec #:  [id number]       Height:       71.0 in Accession #:    [id number]      Weight:       390.0 lb Date of Birth:  204/30/1967       BSA:          2.801 m Patient Age:    89years         BP:           126/65 mmHg Patient Gender: M                HR:           82 bpm. Exam Location:  Inpatient Procedure: 2D Echo, Cardiac Doppler and Color Doppler Indications:    CHF-Acute Diastolic  History:        Patient has prior history of Echocardiogram examinations, most                 recent 06/01/2017. Risk Factors:Hypertension. Morbid Obesity,                 Covid-19,OSA (obstructive sleep apnea).  Sonographer:    BAlvino ChapelRCS Referring Phys: [phone] M GARDNER  Sonographer Comments: Technically difficult study due to poor echo windows. IMPRESSIONS  1. Left ventricular ejection fraction, by estimation, is 50 to 55%. The left ventricle has low normal function. Left ventricular endocardial border not optimally defined to evaluate regional wall motion. The left ventricular internal cavity size was moderately dilated. Left ventricular diastolic parameters are indeterminate.  2. Simon ventricular systolic function is normal. The Simon  ventricular size is normal. Tricuspid regurgitation signal is inadequate for assessing PA pressure.  3. Left atrial size was mildly dilated.  4. The mitral valve is grossly normal. Trivial mitral valve regurgitation.  5. The aortic valve was not well visualized. Aortic valve regurgitation is not visualized. Mild aortic valve sclerosis is present, with no evidence of aortic valve stenosis.  6. The inferior vena cava is dilated in size with >50% respiratory variability, suggesting Simon atrial pressure of 8 mmHg. FINDINGS  Left Ventricle: Left ventricular  ejection fraction, by estimation, is 50 to 55%. The left ventricle has low normal function. Left ventricular endocardial border not optimally defined to evaluate regional wall motion. Definity contrast agent was given IV  to delineate the left ventricular endocardial borders. The left ventricular internal cavity size was moderately dilated. There is borderline left ventricular hypertrophy. Left ventricular diastolic parameters are indeterminate. Simon Ventricle: The Simon ventricular size is normal. No increase in Simon ventricular wall thickness. Simon ventricular systolic function is normal. Tricuspid regurgitation signal is inadequate for assessing PA pressure. Left Atrium: Left atrial size was mildly dilated. Simon Atrium: Simon atrial size was normal in size. Pericardium: There is no evidence of pericardial effusion. Mitral Valve: The mitral valve is grossly normal. Trivial mitral valve regurgitation. Tricuspid Valve: The tricuspid valve is grossly normal. Tricuspid valve regurgitation is trivial. Aortic Valve: The aortic valve was not well visualized. Aortic valve regurgitation is not visualized. Mild aortic valve sclerosis is present, with no evidence of aortic valve stenosis. Moderate aortic valve annular calcification. Pulmonic Valve: The pulmonic valve was not well visualized. Pulmonic valve regurgitation is not visualized. Aorta: The aortic root is normal in size and structure. Venous: The inferior vena cava is dilated in size with greater than 50% respiratory variability, suggesting Simon atrial pressure of 8 mmHg. IAS/Shunts: No atrial level shunt detected by color flow Doppler.  LEFT VENTRICLE PLAX 2D LVIDd:         6.40 cm  Diastology LVIDs:         4.50 cm  LV e' lateral:   9.14 cm/s LV PW:         1.10 cm  LV E/e' lateral: 14.6 LV IVS:        1.00 cm  LV e' medial:    7.94 cm/s LVOT diam:     2.00 cm  LV E/e' medial:  16.8 LV SV:         93 LV SV Index:   33 LVOT Area:     3.14 cm  Simon VENTRICLE  RV S prime:     17.10 cm/s TAPSE (M-mode): 3.0 cm LEFT ATRIUM              Index       Simon ATRIUM           Index LA diam:        4.70 cm  1.68 cm/m  RA Area:     19.20 cm LA Vol (A2C):   126.0 ml 44.99 ml/m RA Volume:   57.00 ml  20.35 ml/m LA Vol (A4C):   96.7 ml  34.53 ml/m LA Biplane Vol: 110.0 ml 39.28 ml/m  AORTIC VALVE LVOT Vmax:   140.00 cm/s LVOT Vmean:  98.500 cm/s LVOT VTI:    0.295 m  AORTA Ao Root diam: 3.10 cm MITRAL VALVE MV Area (PHT): 3.29 cm     SHUNTS MV Decel Time: 231 msec     Systemic VTI:  0.30 m MV E  velocity: 133.00 cm/s  Systemic Diam: 2.00 cm MV A velocity: 76.00 cm/s MV E/A ratio:  1.75 Rozann Lesches MD Electronically signed by Rozann Lesches MD Signature Date/Time: 06/10/2019/7:01:53 PM    Final    VAS [place] LOWER EXTREMITY VENOUS (DVT)  Result Date: 06/10/2019  Lower Venous DVTStudy Indications: Edema, and Elevated D-Dimer, Covid-19.  Limitations: Body habitus and significant edema. Comparison Study: Prior left lower extremity venous duplex from 04/27/16 is                   available for comparison. Performing Technologist: Sharion Dove RVS  Examination Guidelines: A complete evaluation includes B-mode imaging, spectral Doppler, color Doppler, and power Doppler as needed of all accessible portions of each vessel. Bilateral testing is considered an integral part of a complete examination. Limited examinations for reoccurring indications may be performed as noted. The reflux portion of the exam is performed with the patient in reverse Trendelenburg.  +---------+---------------+---------+-----------+----------+--------------+ Simon    CompressibilityPhasicitySpontaneityPropertiesThrombus Aging +---------+---------------+---------+-----------+----------+--------------+ CFV      Full           Yes      Yes                                 +---------+---------------+---------+-----------+----------+--------------+ SFJ      Full                                                         +---------+---------------+---------+-----------+----------+--------------+ FV Prox  Full                                                        +---------+---------------+---------+-----------+----------+--------------+ FV Mid                  Yes      Yes                                 +---------+---------------+---------+-----------+----------+--------------+ FV Distal               Yes      Yes                                 +---------+---------------+---------+-----------+----------+--------------+ PFV                                                   Not visualized +---------+---------------+---------+-----------+----------+--------------+ POP      Full           Yes      Yes                                 +---------+---------------+---------+-----------+----------+--------------+ PTV      Full                                                        +---------+---------------+---------+-----------+----------+--------------+  PERO     Full                                                        +---------+---------------+---------+-----------+----------+--------------+   +---------+---------------+---------+-----------+----------+--------------+ LEFT     CompressibilityPhasicitySpontaneityPropertiesThrombus Aging +---------+---------------+---------+-----------+----------+--------------+ CFV      Full           Yes      Yes                                 +---------+---------------+---------+-----------+----------+--------------+ FV Prox                                               Not visualized +---------+---------------+---------+-----------+----------+--------------+ FV Mid                                                Not visualized +---------+---------------+---------+-----------+----------+--------------+ FV Distal                                             Not visualized  +---------+---------------+---------+-----------+----------+--------------+ PFV                                                   Not visualized +---------+---------------+---------+-----------+----------+--------------+ POP      Full           Yes      Yes                                 +---------+---------------+---------+-----------+----------+--------------+ PTV      Full                                                        +---------+---------------+---------+-----------+----------+--------------+ PERO     Full                                                        +---------+---------------+---------+-----------+----------+--------------+     Summary: Simon: - There is no evidence of deep vein thrombosis in the lower extremity. However, portions of this examination were limited- see technologist comments above.  LEFT: - Findings appear essentially unchanged compared to previous examination. - There is no evidence of deep vein thrombosis in the lower extremity. However, portions of this examination were limited- see technologist comments above.  *See table(s) above for measurements and observations. Electronically signed by Ruta Hinds MD on 06/10/2019  at 6:42:32 PM.    Final

## 2019-06-14 LAB — COMPREHENSIVE METABOLIC PANEL
ALT: 25 U/L (ref 0–44)
AST: 22 U/L (ref 15–41)
Albumin: 3.1 g/dL — ABNORMAL LOW (ref 3.5–5.0)
Alkaline Phosphatase: 74 U/L (ref 38–126)
Anion gap: 10 (ref 5–15)
BUN: 21 mg/dL — ABNORMAL HIGH (ref 6–20)
CO2: 36 mmol/L — ABNORMAL HIGH (ref 22–32)
Calcium: 8.3 mg/dL — ABNORMAL LOW (ref 8.9–10.3)
Chloride: 93 mmol/L — ABNORMAL LOW (ref 98–111)
Creatinine, Ser: 0.87 mg/dL (ref 0.61–1.24)
GFR calc Af Amer: >60 mL/min (ref >60)
GFR calc non Af Amer: >60 mL/min (ref >60)
Glucose, Bld: 77 mg/dL (ref 70–99)
Potassium: 4.3 mmol/L (ref 3.5–5.1)
Sodium: 139 mmol/L (ref 135–145)
Total Bilirubin: 1.3 mg/dL — ABNORMAL HIGH (ref 0.3–1.2)
Total Protein: 6.5 g/dL (ref 6.5–8.1)

## 2019-06-14 LAB — C-REACTIVE PROTEIN: CRP: 0.8 mg/dL (ref <1.0)

## 2019-06-14 LAB — CBC WITH DIFFERENTIAL/PLATELET
Abs Immature Granulocytes: 0.04 10*3/uL (ref 0.00–0.07)
Basophils Absolute: 0 10*3/uL (ref 0.0–0.1)
Basophils Relative: 0 %
Eosinophils Absolute: 0.1 10*3/uL (ref 0.0–0.5)
Eosinophils Relative: 1 %
HCT: 43.7 % (ref 39.0–52.0)
Hemoglobin: 13.2 g/dL (ref 13.0–17.0)
Immature Granulocytes: 0 %
Lymphocytes Relative: 15 %
Lymphs Abs: 1.6 10*3/uL (ref 0.7–4.0)
MCH: 28.9 pg (ref 26.0–34.0)
MCHC: 30.2 g/dL (ref 30.0–36.0)
MCV: 95.8 fL (ref 80.0–100.0)
Monocytes Absolute: 1.1 10*3/uL — ABNORMAL HIGH (ref 0.1–1.0)
Monocytes Relative: 10 %
Neutro Abs: 7.6 10*3/uL (ref 1.7–7.7)
Neutrophils Relative %: 74 %
Platelets: 176 10*3/uL (ref 150–400)
RBC: 4.56 MIL/uL (ref 4.22–5.81)
RDW: 15.9 % — ABNORMAL HIGH (ref 11.5–15.5)
WBC: 10.4 10*3/uL (ref 4.0–10.5)
nRBC: 0 % (ref 0.0–0.2)

## 2019-06-14 LAB — BRAIN NATRIURETIC PEPTIDE: B Natriuretic Peptide: 88.4 pg/mL (ref 0.0–100.0)

## 2019-06-14 LAB — MAGNESIUM: Magnesium: 2.2 mg/dL (ref 1.7–2.4)

## 2019-06-14 LAB — D-DIMER, QUANTITATIVE: D-Dimer, Quant: 2.76 ug/mL-FEU — ABNORMAL HIGH (ref 0.00–0.50)

## 2019-06-14 MED ORDER — DEXAMETHASONE 6 MG PO TABS
6.0000 mg | ORAL_TABLET | Freq: Every day | ORAL | Status: DC
  Administered 2019-06-15 – 2019-06-16 (×2): 6 mg via ORAL
  Filled 2019-06-14 (×2): qty 1

## 2019-06-14 MED ORDER — FUROSEMIDE 10 MG/ML IJ SOLN
60.0000 mg | Freq: Every day | INTRAMUSCULAR | Status: DC
  Administered 2019-06-15 – 2019-06-16 (×2): 60 mg via INTRAVENOUS
  Filled 2019-06-14 (×2): qty 6

## 2019-06-14 NOTE — Progress Notes (Signed)
PROGRESS NOTE                                                                                                                                                                                                             Patient Demographics:    Jeffery Simon, is a 54 y.o. male, DOB - [birth year], OAC:[id number]  Outpatient Primary MD for the patient is Greig Right, MD    LOS - 5  Admit date - 06/09/2019    Chief Complaint  Patient presents with  . Leg Swelling  . hypoxia       Brief Narrative   - Jeffery Simon is a 53 y.o. male with medical history significant of HTN, obesity, HFpEF grade 2 diastolic dysfunction in 0109.  Pt stable on 71m lasix at home for past 2 years until the past couple of months when he slowly began to have wt gain, worsening peripheral edema, increased SOB, DOE. In the ER diagnosed with CHF + Covid.   Subjective:   Patient in bed, appears comfortable, denies headache, fever or chills, no dyspnea.   Assessment  & Plan :    Acute Hypoxic Resp. Failure due to Acute Covid 19 Viral Pneumonitis + CHF  - seems to have moderate disease, placed on IV steroid + IV remdesivir and IV Lasix.  -He finished IV remdesivir treatment. -Initially on IV steroids, currently transitioned to p.o. Decadron 6 mg daily . -Patient with increased oxygen requirement overnight, but this is mainly due to obstructive sleep apnea, and inability to use CPAP due to [id number] policy. -He still requiring 2 L nasal cannula at rest, and 3 L on activity.  Encouraged the patient to sit up in chair in the daytime use I-S and flutter valve for pulmonary toiletry and then prone in bed when at night.  Will advance activity and titrate down oxygen as possible.  SpO2: 91 % O2 Flow Rate (L/min): 2 L/min  Recent Labs  Lab 06/09/19 1415 06/09/19 1543 06/09/19 2002 06/10/19 0500 06/11/19 0440 06/12/19 0427 06/13/19 0909  06/14/19 0627  CRP   < >  --  2.2* 1.9* 2.5* 1.5* 0.9 0.8  DDIMER   < >  --   --  3.93* 3.92* 3.37* 5.08* 2.76*  BNP   < >  --   --  212.2* 175.7* 117.8* 110.4*  88.4  PROCALCITON  --   --  <0.10  --   --   --   --   --   SARSCOV2NAA  --  POSITIVE*  --   --   --   --   --   --    < > = values in this interval not displayed.    Hepatic Function Latest Ref Rng & Units 06/14/2019 06/13/2019 06/12/2019  Total Protein 6.5 - 8.1 g/dL 6.5 7.2 6.4(L)  Albumin 3.5 - 5.0 g/dL 3.1(L) 3.3(L) 2.9(L)  AST 15 - 41 U/L 22 25 16   ALT 0 - 44 U/L 25 26 21   Alk Phosphatase 38 - 126 U/L 74 84 81  Total Bilirubin 0.3 - 1.2 mg/dL 1.3(H) 1.1 0.9     Acute on chronic diastolic CHF EF 92%  -Continue with IV diuresis, still appears to be significantly volume overloaded, but has been responding very well to IV Lasix, I will go ahead and increase his dose to 60 mg daily., so far he is -19 L since admission . -Continue to monitor electrolytes closely . . -Placed on Coreg and ACE inhibitor, echocardiogram noted with preserved EF and no wall motion abnormality.  Will monitor closely with ongoing diuresis.  Morbid obesity BMI 50 changed with OSA.   -CPAP at nighttime at home, during hospital stay he is on high flow nasal cannula .  High D Dimer  - leg ultrasound and CTA negative for any clots, continue moderate dose Lovenox and monitor.    Condition - Fair  Family Communication  :  Wife [name] [phone]  on 06/14/2019  Code Status :  Full  Consults  : None  Procedures  :    CTA - Right-sided pleural effusion and bibasilar patchy opacities which may be related to the underlying clinical history. Mild pulmonary edema. Hilar and mediastinal lymph nodes likely reactive in nature but not significant by size criteria. No evidence of pulmonary embolus  TTE  1. Left ventricular ejection fraction, by estimation, is 50 to 55%. The left ventricle has low normal function. Left ventricular endocardial border not  optimally defined to evaluate regional wall motion. The left ventricular internal cavity size was moderately dilated. Left ventricular diastolic parameters are  indeterminate.  2. Right ventricular systolic function is normal. The right ventricular size is normal. Tricuspid regurgitation signal is inadequate for assessing PA pressure.  3. Left atrial size was mildly dilated.  4. The mitral valve is grossly normal. Trivial mitral valve regurgitation.  5. The aortic valve was not well visualized. Aortic valve regurgitation is not visualized. Mild aortic valve sclerosis is present, with no evidence of aortic valve stenosis.  6. The inferior vena cava is dilated in size with >50% respiratory variability, suggesting right atrial pressure of 8 mmHg.    Leg [place] - No DVT  PUD Prophylaxis : None  Disposition Plan  :    Status is: Observation > Inpt   Dispo: The patient is from: Home              Anticipated d/c is to: Home              Anticipated d/c date is: 2-3 days                Patient currently is not medically stable to d/c.  Finished treatment for COVID-19 infection.  Being diuresed for CHF with IV Lasix.   DVT Prophylaxis  :  Lovenox   Lab Results  Component  Value Date   PLT 176 06/14/2019    Diet :  Diet Order            Diet Heart Room service appropriate? Yes; Fluid consistency: Thin  Diet effective now               Inpatient Medications  Scheduled Meds: . carvedilol  3.125 mg Oral BID WC  . dexamethasone (DECADRON) injection  2 mg Intravenous Q24H  . enoxaparin (LOVENOX) injection  90 mg Subcutaneous Q24H  . fluticasone  1 spray Each Nare Daily  . furosemide  40 mg Intravenous Daily  . lisinopril  5 mg Oral Daily  . multivitamin with minerals  1 tablet Oral Daily  . omega-3 acid ethyl esters  1 g Oral Daily  . sodium chloride flush  3 mL Intravenous Once  . spironolactone  25 mg Oral Daily   Continuous Infusions:  PRN Meds:.acetaminophen, albuterol,  [DISCONTINUED] ondansetron **OR** ondansetron (ZOFRAN) IV  Antibiotics  :    Anti-infectives (From admission, onward)   Start     Dose/Rate Route Frequency Ordered Stop   06/10/19 1600  remdesivir 100 mg in sodium chloride 0.9 % 100 mL IVPB     100 mg 200 mL/hr over 30 Minutes Intravenous Daily 06/09/19 1951 06/13/19 1900   06/09/19 2000  remdesivir 200 mg in sodium chloride 0.9% 250 mL IVPB     200 mg 580 mL/hr over 30 Minutes Intravenous Once 06/09/19 1951 06/09/19 2120       Time Spent in minutes  30   Phillips Climes M.D on 06/14/2019 at 11:32 AM  To page go to [email or web address] - password [facility]  [facility] -  Office  [phone]   See all Orders from today for further details    Objective:   Vitals:   06/13/19 1945 06/13/19 2045 06/14/19 0415 06/14/19 0914  BP: (!) 144/77  (!) 142/89 (!) 162/84  Pulse: 73  68 86  Resp: 18 20 12 17   Temp: 98.9 F (37.2 C)  98 F (36.7 C)   TempSrc: Oral  Oral   SpO2: 95% 95% 93% 91%  Weight:      Height:        Wt Readings from Last 3 Encounters:  06/13/19 (!) 179.5 kg  08/01/18 (!) 176 kg  06/01/17 (!) 173.7 kg     Intake/Output Summary (Last 24 hours) at 06/14/2019 1132 Last data filed at 06/14/2019 1100 Gross per 24 hour  Intake 360 ml  Output 5550 ml  Net -5190 ml     Physical Exam  Awake Alert, Oriented X 3, No new F.N deficits, Normal affect Symmetrical Chest wall movement, Good air movement bilaterally, CTAB RRR,No Gallops,Rubs or new Murmurs, No Parasternal Heave +ve B.Sounds, Abd Soft, No tenderness, No rebound - guarding or rigidity. No Cyanosis, Clubbing ,has Trace  edema, No new Rash or bruise       Data Review:    CBC Recent Labs  Lab 06/10/19 0500 06/11/19 0440 06/12/19 0427 06/13/19 0909 06/14/19 0627  WBC 10.0 11.0* 12.4* 10.8* 10.4  HGB 12.9* 12.8* 13.1 13.9 13.2  HCT 43.8 43.1 43.8 47.0 43.7  PLT 195 179 215 208 176  MCV 99.3 97.1 97.8 98.3 95.8  MCH 29.3 28.8 29.2 29.1 28.9   MCHC 29.5* 29.7* 29.9* 29.6* 30.2  RDW 16.4* 15.9* 15.9* 15.9* 15.9*  LYMPHSABS 0.9 0.7 1.2 1.4 1.6  MONOABS 0.7 0.6 0.9 0.6 1.1*  EOSABS 0.2 0.0 0.0 0.1 0.1  BASOSABS 0.0  0.0 0.0 0.0 0.0    Chemistries  Recent Labs  Lab 06/10/19 0500 06/11/19 0440 06/12/19 0427 06/13/19 0909 06/14/19 0627  NA 142 140 140 139 139  K 4.2 4.9 4.5 4.5 4.3  CL 94* 96* 92* 91* 93*  CO2 38* 36* 39* 37* 36*  GLUCOSE 99 116* 102* 87 77  BUN 14 16 18 20  21*  CREATININE 1.00 0.77 0.78 0.79 0.87  CALCIUM 8.6* 8.3* 8.4* 8.6* 8.3*  AST 23 27 16 25 22   ALT 27 23 21 26 25   ALKPHOS 103 87 81 84 74  BILITOT 1.2 1.1 0.9 1.1 1.3*  MG 2.1 2.2 2.3 2.4 2.2     ------------------------------------------------------------------------------------------------------------------ No results for input(s): CHOL, HDL, LDLCALC, TRIG, CHOLHDL, LDLDIRECT in the last 72 hours.  Lab Results  Component Value Date   HGBA1C 5.5 05/31/2017   ------------------------------------------------------------------------------------------------------------------ No results for input(s): TSH, T4TOTAL, T3FREE, THYROIDAB in the last 72 hours.  Invalid input(s): FREET3  Cardiac Enzymes No results for input(s): CKMB, TROPONINI, MYOGLOBIN in the last 168 hours.  Invalid input(s): CK ------------------------------------------------------------------------------------------------------------------    Component Value Date/Time   BNP 88.4 06/14/2019 4801    Micro Results Recent Results (from the past 240 hour(s))  SARS Coronavirus 2 by RT PCR (hospital order, performed in [facility] hospital lab) Nasopharyngeal Nasopharyngeal Swab     Status: Abnormal   Collection Time: 06/09/19  3:43 PM   Specimen: Nasopharyngeal Swab  Result Value Ref Range Status   SARS Coronavirus 2 POSITIVE (A) NEGATIVE Final    Comment: RESULT CALLED TO, READ BACK BY AND VERIFIED WITH: RN Arvella Merles [id number] AT [id number] BY CM (NOTE) SARS-CoV-2 target  nucleic acids are DETECTED SARS-CoV-2 RNA is generally detectable in upper respiratory specimens  during the acute phase of infection.  Positive results are indicative  of the presence of the identified virus, but do not rule out bacterial infection or co-infection with other pathogens not detected by the test.  Clinical correlation with patient history and  other diagnostic information is necessary to determine patient infection status.  The expected result is negative. Fact Sheet for Patients:   StrictlyIdeas.no  Fact Sheet for Healthcare Providers:   BankingDealers.co.za   This test is not yet approved or cleared by the [place] FDA and  has been authorized for detection and/or diagnosis of SARS-CoV-2 by FDA under an Emergency Use Authorization (EUA).  This EUA will remain in effect (meaning this test  can be used) for the duration of  the COVID-19 declaration under Section 564(b)(1) of the Act, 21 U.S.C. section 360-bbb-3(b)(1), unless the authorization is terminated or revoked sooner. Performed at [facility], [place] [address] [place], [place] [address]   MRSA PCR Screening     Status: None   Collection Time: 06/11/19 10:23 AM   Specimen: Nasal Mucosa; Nasopharyngeal  Result Value Ref Range Status   MRSA by PCR NEGATIVE NEGATIVE Final    Comment:        The GeneXpert MRSA Assay (FDA approved for NASAL specimens only), is one component of a comprehensive MRSA colonization surveillance program. It is not intended to diagnose MRSA infection nor to guide or monitor treatment for MRSA infections. Performed at [facility], [place] [address], [place], [place] [address]     Radiology Reports DG Chest 2 View  Result Date: 06/09/2019 CLINICAL DATA:  Shortness of breath for 1 week with lower extremity swelling, initial encounter EXAM: CHEST - 2 VIEW COMPARISON:  05/30/2017  FINDINGS: Cardiac shadow is mildly prominent.  Vascular congestion is noted with mild pulmonary edema consistent with CHF. Small right-sided pleural effusion is noted. Some right basilar atelectasis/early infiltrate is also noted. No bony abnormality is noted. IMPRESSION: Changes of CHF with right basilar infiltrate/atelectasis. Electronically Signed   By: Inez Catalina M.D.   On: 06/09/2019 14:50   CT Angio Chest PE W and/or Wo Contrast  Result Date: 06/09/2019 CLINICAL DATA:  History of COVID-19 positivity with chest pain and lower extremity swelling EXAM: CT ANGIOGRAPHY CHEST WITH CONTRAST TECHNIQUE: Multidetector CT imaging of the chest was performed using the standard protocol during bolus administration of intravenous contrast. Multiplanar CT image reconstructions and MIPs were obtained to evaluate the vascular anatomy. CONTRAST:  12m OMNIPAQUE IOHEXOL 350 MG/ML SOLN COMPARISON:  None. FINDINGS: Cardiovascular: Thoracic aorta shows no aneurysmal dilatation. No cardiac enlargement is seen. Pulmonary artery opacification is somewhat limited due to timing of the contrast bolus although no pulmonary embolus is noted. Mediastinum/Nodes: Thoracic inlet is within normal limits. Scattered small hilar and mediastinal lymph nodes are noted likely reactive in nature. The esophagus is within normal limits as visualized. Lungs/Pleura: Moderate right-sided pleural effusion is noted. Mild interstitial edema is seen. Patchy infiltrate is noted in the bases bilaterally. This may be related to the known COVID-19 infection. No sizable parenchymal nodules are noted. Upper Abdomen: Visualized upper abdomen is within normal limits. Musculoskeletal: Degenerative changes of the thoracic spine are seen. Review of the MIP images confirms the above findings. IMPRESSION: Right-sided pleural effusion and bibasilar patchy opacities which may be related to the underlying clinical history. Mild pulmonary edema. Hilar and mediastinal lymph nodes likely reactive in nature but not  significant by size criteria. No evidence of pulmonary embolus. Electronically Signed   By: MInez CatalinaM.D.   On: 06/09/2019 17:54   ECHOCARDIOGRAM COMPLETE  Result Date: 06/10/2019    ECHOCARDIOGRAM REPORT   Patient Name:   Jeffery CRISTDate of Exam: 06/10/2019 Medical Rec #:  [id number]       Height:       71.0 in Accession #:    [id number]      Weight:       390.0 lb Date of Birth:  215-May-1967       BSA:          2.801 m Patient Age:    55years         BP:           126/65 mmHg Patient Gender: M                HR:           82 bpm. Exam Location:  Inpatient Procedure: 2D Echo, Cardiac Doppler and Color Doppler Indications:    CHF-Acute Diastolic  History:        Patient has prior history of Echocardiogram examinations, most                 recent 06/01/2017. Risk Factors:Hypertension. Morbid Obesity,                 Covid-19,OSA (obstructive sleep apnea).  Sonographer:    BAlvino ChapelRCS Referring Phys: [phone] M GARDNER  Sonographer Comments: Technically difficult study due to poor echo windows. IMPRESSIONS  1. Left ventricular ejection fraction, by estimation, is 50 to 55%. The left ventricle has low normal function. Left ventricular endocardial border not optimally defined to evaluate regional wall motion. The left  ventricular internal cavity size was moderately dilated. Left ventricular diastolic parameters are indeterminate.  2. Right ventricular systolic function is normal. The right ventricular size is normal. Tricuspid regurgitation signal is inadequate for assessing PA pressure.  3. Left atrial size was mildly dilated.  4. The mitral valve is grossly normal. Trivial mitral valve regurgitation.  5. The aortic valve was not well visualized. Aortic valve regurgitation is not visualized. Mild aortic valve sclerosis is present, with no evidence of aortic valve stenosis.  6. The inferior vena cava is dilated in size with >50% respiratory variability, suggesting right atrial pressure of 8 mmHg.  FINDINGS  Left Ventricle: Left ventricular ejection fraction, by estimation, is 50 to 55%. The left ventricle has low normal function. Left ventricular endocardial border not optimally defined to evaluate regional wall motion. Definity contrast agent was given IV  to delineate the left ventricular endocardial borders. The left ventricular internal cavity size was moderately dilated. There is borderline left ventricular hypertrophy. Left ventricular diastolic parameters are indeterminate. Right Ventricle: The right ventricular size is normal. No increase in right ventricular wall thickness. Right ventricular systolic function is normal. Tricuspid regurgitation signal is inadequate for assessing PA pressure. Left Atrium: Left atrial size was mildly dilated. Right Atrium: Right atrial size was normal in size. Pericardium: There is no evidence of pericardial effusion. Mitral Valve: The mitral valve is grossly normal. Trivial mitral valve regurgitation. Tricuspid Valve: The tricuspid valve is grossly normal. Tricuspid valve regurgitation is trivial. Aortic Valve: The aortic valve was not well visualized. Aortic valve regurgitation is not visualized. Mild aortic valve sclerosis is present, with no evidence of aortic valve stenosis. Moderate aortic valve annular calcification. Pulmonic Valve: The pulmonic valve was not well visualized. Pulmonic valve regurgitation is not visualized. Aorta: The aortic root is normal in size and structure. Venous: The inferior vena cava is dilated in size with greater than 50% respiratory variability, suggesting right atrial pressure of 8 mmHg. IAS/Shunts: No atrial level shunt detected by color flow Doppler.  LEFT VENTRICLE PLAX 2D LVIDd:         6.40 cm  Diastology LVIDs:         4.50 cm  LV e' lateral:   9.14 cm/s LV PW:         1.10 cm  LV E/e' lateral: 14.6 LV IVS:        1.00 cm  LV e' medial:    7.94 cm/s LVOT diam:     2.00 cm  LV E/e' medial:  16.8 LV SV:         93 LV SV Index:    33 LVOT Area:     3.14 cm  RIGHT VENTRICLE RV S prime:     17.10 cm/s TAPSE (M-mode): 3.0 cm LEFT ATRIUM              Index       RIGHT ATRIUM           Index LA diam:        4.70 cm  1.68 cm/m  RA Area:     19.20 cm LA Vol (A2C):   126.0 ml 44.99 ml/m RA Volume:   57.00 ml  20.35 ml/m LA Vol (A4C):   96.7 ml  34.53 ml/m LA Biplane Vol: 110.0 ml 39.28 ml/m  AORTIC VALVE LVOT Vmax:   140.00 cm/s LVOT Vmean:  98.500 cm/s LVOT VTI:    0.295 m  AORTA Ao Root diam: 3.10 cm MITRAL VALVE MV Area (PHT):  3.29 cm     SHUNTS MV Decel Time: 231 msec     Systemic VTI:  0.30 m MV E velocity: 133.00 cm/s  Systemic Diam: 2.00 cm MV A velocity: 76.00 cm/s MV E/A ratio:  1.75 Rozann Lesches MD Electronically signed by Rozann Lesches MD Signature Date/Time: 06/10/2019/7:01:53 PM    Final    VAS [place] LOWER EXTREMITY VENOUS (DVT)  Result Date: 06/10/2019  Lower Venous DVTStudy Indications: Edema, and Elevated D-Dimer, Covid-19.  Limitations: Body habitus and significant edema. Comparison Study: Prior left lower extremity venous duplex from 04/27/16 is                   available for comparison. Performing Technologist: Sharion Dove RVS  Examination Guidelines: A complete evaluation includes B-mode imaging, spectral Doppler, color Doppler, and power Doppler as needed of all accessible portions of each vessel. Bilateral testing is considered an integral part of a complete examination. Limited examinations for reoccurring indications may be performed as noted. The reflux portion of the exam is performed with the patient in reverse Trendelenburg.  +---------+---------------+---------+-----------+----------+--------------+ RIGHT    CompressibilityPhasicitySpontaneityPropertiesThrombus Aging +---------+---------------+---------+-----------+----------+--------------+ CFV      Full           Yes      Yes                                 +---------+---------------+---------+-----------+----------+--------------+ SFJ       Full                                                        +---------+---------------+---------+-----------+----------+--------------+ FV Prox  Full                                                        +---------+---------------+---------+-----------+----------+--------------+ FV Mid                  Yes      Yes                                 +---------+---------------+---------+-----------+----------+--------------+ FV Distal               Yes      Yes                                 +---------+---------------+---------+-----------+----------+--------------+ PFV                                                   Not visualized +---------+---------------+---------+-----------+----------+--------------+ POP      Full           Yes      Yes                                 +---------+---------------+---------+-----------+----------+--------------+  PTV      Full                                                        +---------+---------------+---------+-----------+----------+--------------+ PERO     Full                                                        +---------+---------------+---------+-----------+----------+--------------+   +---------+---------------+---------+-----------+----------+--------------+ LEFT     CompressibilityPhasicitySpontaneityPropertiesThrombus Aging +---------+---------------+---------+-----------+----------+--------------+ CFV      Full           Yes      Yes                                 +---------+---------------+---------+-----------+----------+--------------+ FV Prox                                               Not visualized +---------+---------------+---------+-----------+----------+--------------+ FV Mid                                                Not visualized +---------+---------------+---------+-----------+----------+--------------+ FV Distal                                              Not visualized +---------+---------------+---------+-----------+----------+--------------+ PFV                                                   Not visualized +---------+---------------+---------+-----------+----------+--------------+ POP      Full           Yes      Yes                                 +---------+---------------+---------+-----------+----------+--------------+ PTV      Full                                                        +---------+---------------+---------+-----------+----------+--------------+ PERO     Full                                                        +---------+---------------+---------+-----------+----------+--------------+     Summary: RIGHT: - There is no evidence of deep vein thrombosis in the lower  extremity. However, portions of this examination were limited- see technologist comments above.  LEFT: - Findings appear essentially unchanged compared to previous examination. - There is no evidence of deep vein thrombosis in the lower extremity. However, portions of this examination were limited- see technologist comments above.  *See table(s) above for measurements and observations. Electronically signed by Ruta Hinds MD on 06/10/2019 at 6:42:32 PM.    Final

## 2019-06-14 NOTE — Progress Notes (Signed)
Pt ambulated about 759ft in the hallway today. Pt was on 2L at 96% while resting. We began to ambulate while he wore 2L. The lowest he dropped to was 89% and he only dropped briefly. For majority of the walk he remained at 91-92% while on 2L of oxygen. He was returned to the chair on 2L and at 95%.

## 2019-06-15 LAB — CBC
HCT: 45.6 % (ref 39.0–52.0)
Hemoglobin: 13.9 g/dL (ref 13.0–17.0)
MCH: 29 pg (ref 26.0–34.0)
MCHC: 30.5 g/dL (ref 30.0–36.0)
MCV: 95.2 fL (ref 80.0–100.0)
Platelets: 202 10*3/uL (ref 150–400)
RBC: 4.79 MIL/uL (ref 4.22–5.81)
RDW: 15.9 % — ABNORMAL HIGH (ref 11.5–15.5)
WBC: 11.7 10*3/uL — ABNORMAL HIGH (ref 4.0–10.5)
nRBC: 0 % (ref 0.0–0.2)

## 2019-06-15 LAB — COMPREHENSIVE METABOLIC PANEL
ALT: 32 U/L (ref 0–44)
AST: 25 U/L (ref 15–41)
Albumin: 3.2 g/dL — ABNORMAL LOW (ref 3.5–5.0)
Alkaline Phosphatase: 80 U/L (ref 38–126)
Anion gap: 7 (ref 5–15)
BUN: 20 mg/dL (ref 6–20)
CO2: 36 mmol/L — ABNORMAL HIGH (ref 22–32)
Calcium: 8.5 mg/dL — ABNORMAL LOW (ref 8.9–10.3)
Chloride: 95 mmol/L — ABNORMAL LOW (ref 98–111)
Creatinine, Ser: 0.84 mg/dL (ref 0.61–1.24)
GFR calc Af Amer: >60 mL/min (ref >60)
GFR calc non Af Amer: >60 mL/min (ref >60)
Glucose, Bld: 93 mg/dL (ref 70–99)
Potassium: 4.4 mmol/L (ref 3.5–5.1)
Sodium: 138 mmol/L (ref 135–145)
Total Bilirubin: 1.6 mg/dL — ABNORMAL HIGH (ref 0.3–1.2)
Total Protein: 7 g/dL (ref 6.5–8.1)

## 2019-06-15 MED ORDER — ENOXAPARIN SODIUM 100 MG/ML ~~LOC~~ SOLN
85.0000 mg | SUBCUTANEOUS | Status: DC
  Administered 2019-06-16: 85 mg via SUBCUTANEOUS
  Filled 2019-06-15: qty 1

## 2019-06-15 NOTE — Care Management (Signed)
CM acknowledges home oxygen order however per latest documented pulm ambulatory note pt does not qualify.  Please re-consult TOC if pt meets parameters ( 88% or less on RA.) for home oxygen insurance approval

## 2019-06-15 NOTE — Plan of Care (Signed)
  Problem: Education: Goal: Knowledge of risk factors and measures for prevention of condition will improve Outcome: Progressing   Problem: Coping: Goal: Psychosocial and spiritual needs will be supported Outcome: Progressing   Problem: Respiratory: Goal: Will maintain a patent airway Outcome: Progressing Goal: Complications related to the disease process, condition or treatment will be avoided or minimized Outcome: Progressing   Problem: Education: Goal: Ability to demonstrate management of disease process will improve Outcome: Progressing Goal: Ability to verbalize understanding of medication therapies will improve Outcome: Progressing Goal: Individualized Educational Video(s) Outcome: Progressing   Problem: Activity: Goal: Capacity to carry out activities will improve Outcome: Progressing   Problem: Cardiac: Goal: Ability to achieve and maintain adequate cardiopulmonary perfusion will improve Outcome: Progressing   Problem: Education: Goal: Knowledge of General Education information will improve Description: Including pain rating scale, medication(s)/side effects and non-pharmacologic comfort measures Outcome: Progressing   Problem: Health Behavior/Discharge Planning: Goal: Ability to manage health-related needs will improve Outcome: Progressing   Problem: Clinical Measurements: Goal: Ability to maintain clinical measurements within normal limits will improve Outcome: Progressing Goal: Will remain free from infection Outcome: Progressing Goal: Diagnostic test results will improve Outcome: Progressing Goal: Respiratory complications will improve Outcome: Progressing Goal: Cardiovascular complication will be avoided Outcome: Progressing   Problem: Activity: Goal: Risk for activity intolerance will decrease Outcome: Progressing

## 2019-06-15 NOTE — Progress Notes (Signed)
PROGRESS NOTE                                                                                                                                                                                                             Patient Demographics:    Jeffery Simon, is a 54 y.o. male, DOB - [birth year], PEA:[id number]  Outpatient Primary MD for the patient is Greig Right, MD    LOS - 6  Admit date - 06/09/2019    Chief Complaint  Patient presents with  . Leg Swelling  . hypoxia       Brief Narrative   - Jeffery Simon is a 54 y.o. male with medical history significant of HTN, obesity, HFpEF grade 2 diastolic dysfunction in 2567.  Pt stable on 43m lasix at home for past 2 years until the past couple of months when he slowly began to have wt gain, worsening peripheral edema, increased SOB, DOE. In the ER diagnosed with CHF + Covid.   Subjective:   Patient in bed, appears comfortable, denies headache, fever or chills, no dyspnea.   Assessment  & Plan :    Acute Hypoxic Resp. Failure due to Acute Covid 19 Viral Pneumonitis + CHF  - seems to have moderate disease, placed on IV steroid + IV remdesivir and IV Lasix.  -He finished IV remdesivir treatment. -Initially on IV steroids, currently transitioned to p.o. Decadron 6 mg daily . -Patient with increased oxygen requirement overnight, but this is mainly due to obstructive sleep apnea, and inability to use CPAP due to [id number] policy. -He still requiring 2 L nasal cannula at rest, and 3 L on activity.  Encouraged the patient to sit up in chair in the daytime use I-S and flutter valve for pulmonary toiletry and then prone in bed when at night.  Will advance activity and titrate down oxygen as possible.  SpO2: 97 % O2 Flow Rate (L/min): 2 L/min  Recent Labs  Lab 06/09/19 1415 06/09/19 1543 06/09/19 2002 06/10/19 0500 06/11/19 0440 06/12/19 0427 06/13/19 0909  06/14/19 0627  CRP   < >  --  2.2* 1.9* 2.5* 1.5* 0.9 0.8  DDIMER   < >  --   --  3.93* 3.92* 3.37* 5.08* 2.76*  BNP   < >  --   --  212.2* 175.7* 117.8* 110.4*  88.4  PROCALCITON  --   --  <0.10  --   --   --   --   --   SARSCOV2NAA  --  POSITIVE*  --   --   --   --   --   --    < > = values in this interval not displayed.    Hepatic Function Latest Ref Rng & Units 06/15/2019 06/14/2019 06/13/2019  Total Protein 6.5 - 8.1 g/dL 7.0 6.5 7.2  Albumin 3.5 - 5.0 g/dL 3.2(L) 3.1(L) 3.3(L)  AST 15 - 41 U/L 25 22 25   ALT 0 - 44 U/L 32 25 26  Alk Phosphatase 38 - 126 U/L 80 74 84  Total Bilirubin 0.3 - 1.2 mg/dL 1.6(H) 1.3(H) 1.1     Acute on chronic diastolic CHF EF 09%  -Continue with IV diuresis, volume status significantly improved, but still with significant evidence of volume overload, he is -25 L since admission, I have bumped his Lasix to 60 mg IV daily, continue to monitor electrolytes closely . -Have discussed with patient about importance of daily weight, low-salt diet and fluid restrictions . -Continue to monitor electrolytes closely . . -Placed on Coreg and ACE inhibitor, echocardiogram noted with preserved EF and no wall motion abnormality.  Will monitor closely with ongoing diuresis.  Morbid obesity BMI 50 changed with OSA.   -CPAP at nighttime at home, during hospital stay he is on high flow nasal cannula .  High D Dimer  - leg ultrasound and CTA negative for any clots, continue moderate dose Lovenox and monitor.    Condition - Fair  Family Communication  :  Wife [name] [phone]  on 06/14/2019  Code Status :  Full  Consults  : None  Procedures  :    CTA - Right-sided pleural effusion and bibasilar patchy opacities which may be related to the underlying clinical history. Mild pulmonary edema. Hilar and mediastinal lymph nodes likely reactive in nature but not significant by size criteria. No evidence of pulmonary embolus  TTE  1. Left ventricular ejection  fraction, by estimation, is 50 to 55%. The left ventricle has low normal function. Left ventricular endocardial border not optimally defined to evaluate regional wall motion. The left ventricular internal cavity size was moderately dilated. Left ventricular diastolic parameters are  indeterminate.  2. Right ventricular systolic function is normal. The right ventricular size is normal. Tricuspid regurgitation signal is inadequate for assessing PA pressure.  3. Left atrial size was mildly dilated.  4. The mitral valve is grossly normal. Trivial mitral valve regurgitation.  5. The aortic valve was not well visualized. Aortic valve regurgitation is not visualized. Mild aortic valve sclerosis is present, with no evidence of aortic valve stenosis.  6. The inferior vena cava is dilated in size with >50% respiratory variability, suggesting right atrial pressure of 8 mmHg.    Leg [place] - No DVT  PUD Prophylaxis : None  Disposition Plan  :    Status is: Observation > Inpt   Dispo: The patient is from: Home              Anticipated d/c is to: Home              Anticipated d/c date is: 2-3 days                Patient currently is not medically stable to d/c.  Finished treatment for COVID-19 infection.  Being diuresed for CHF with IV Lasix.  DVT Prophylaxis  :  Lovenox   Lab Results  Component Value Date   PLT 202 06/15/2019    Diet :  Diet Order            Diet Heart Room service appropriate? Yes; Fluid consistency: Thin; Fluid restriction: 1500 mL Fluid  Diet effective now               Inpatient Medications  Scheduled Meds: . carvedilol  3.125 mg Oral BID WC  . dexamethasone  6 mg Oral Daily  . enoxaparin (LOVENOX) injection  90 mg Subcutaneous Q24H  . fluticasone  1 spray Each Nare Daily  . furosemide  60 mg Intravenous Daily  . lisinopril  5 mg Oral Daily  . multivitamin with minerals  1 tablet Oral Daily  . omega-3 acid ethyl esters  1 g Oral Daily  . sodium chloride  flush  3 mL Intravenous Once  . spironolactone  25 mg Oral Daily   Continuous Infusions:  PRN Meds:.acetaminophen, albuterol, [DISCONTINUED] ondansetron **OR** ondansetron (ZOFRAN) IV  Antibiotics  :    Anti-infectives (From admission, onward)   Start     Dose/Rate Route Frequency Ordered Stop   06/10/19 1600  remdesivir 100 mg in sodium chloride 0.9 % 100 mL IVPB     100 mg 200 mL/hr over 30 Minutes Intravenous Daily 06/09/19 1951 06/13/19 1900   06/09/19 2000  remdesivir 200 mg in sodium chloride 0.9% 250 mL IVPB     200 mg 580 mL/hr over 30 Minutes Intravenous Once 06/09/19 1951 06/09/19 2120       Emeline Gins Ayari Liwanag M.D on 06/15/2019 at 11:31 AM  To page go to [email or web address] - password [facility]  [facility] -  Office  [phone]   See all Orders from today for further details    Objective:   Vitals:   06/15/19 0400 06/15/19 0500 06/15/19 0851 06/15/19 1125  BP:  120/69 140/62 126/67  Pulse: 66 65 83 70  Resp: 13 12 (!) 22 16  Temp:  97.9 F (36.6 C) 98.4 F (36.9 C) 98.1 F (36.7 C)  TempSrc:  Oral Oral Oral  SpO2: 100% 100% 97% 97%  Weight:  (!) 170.8 kg    Height:        Wt Readings from Last 3 Encounters:  06/15/19 (!) 170.8 kg  08/01/18 (!) 176 kg  06/01/17 (!) 173.7 kg     Intake/Output Summary (Last 24 hours) at 06/15/2019 1131 Last data filed at 06/15/2019 0639 Gross per 24 hour  Intake --  Output 5500 ml  Net -5500 ml     Physical Exam  Awake Alert, Oriented X 3, No new F.N deficits, Normal affect Symmetrical Chest wall movement, Good air movement bilaterally, CTAB RRR,No Gallops,Rubs or new Murmurs, No Parasternal Heave +ve B.Sounds, Abd Soft, No tenderness, No rebound - guarding or rigidity. No Cyanosis, Clubbing , has trace edema, No new Rash or bruise       Data Review:    CBC Recent Labs  Lab 06/10/19 0500 06/10/19 0500 06/11/19 0440 06/12/19 0427 06/13/19 0909 06/14/19 0627 06/15/19 0401  WBC 10.0   < > 11.0*  12.4* 10.8* 10.4 11.7*  HGB 12.9*   < > 12.8* 13.1 13.9 13.2 13.9  HCT 43.8   < > 43.1 43.8 47.0 43.7 45.6  PLT 195   < > 179 215 208 176 202  MCV 99.3   < > 97.1 97.8 98.3 95.8 95.2  MCH 29.3   < >  28.8 29.2 29.1 28.9 29.0  MCHC 29.5*   < > 29.7* 29.9* 29.6* 30.2 30.5  RDW 16.4*   < > 15.9* 15.9* 15.9* 15.9* 15.9*  LYMPHSABS 0.9  --  0.7 1.2 1.4 1.6  --   MONOABS 0.7  --  0.6 0.9 0.6 1.1*  --   EOSABS 0.2  --  0.0 0.0 0.1 0.1  --   BASOSABS 0.0  --  0.0 0.0 0.0 0.0  --    < > = values in this interval not displayed.    Chemistries  Recent Labs  Lab 06/10/19 0500 06/10/19 0500 06/11/19 0440 06/12/19 0427 06/13/19 0909 06/14/19 0627 06/15/19 0401  NA 142   < > 140 140 139 139 138  K 4.2   < > 4.9 4.5 4.5 4.3 4.4  CL 94*   < > 96* 92* 91* 93* 95*  CO2 38*   < > 36* 39* 37* 36* 36*  GLUCOSE 99   < > 116* 102* 87 77 93  BUN 14   < > 16 18 20  21* 20  CREATININE 1.00   < > 0.77 0.78 0.79 0.87 0.84  CALCIUM 8.6*   < > 8.3* 8.4* 8.6* 8.3* 8.5*  AST 23   < > 27 16 25 22 25   ALT 27   < > 23 21 26 25  32  ALKPHOS 103   < > 87 81 84 74 80  BILITOT 1.2   < > 1.1 0.9 1.1 1.3* 1.6*  MG 2.1  --  2.2 2.3 2.4 2.2  --    < > = values in this interval not displayed.     ------------------------------------------------------------------------------------------------------------------ No results for input(s): CHOL, HDL, LDLCALC, TRIG, CHOLHDL, LDLDIRECT in the last 72 hours.  Lab Results  Component Value Date   HGBA1C 5.5 05/31/2017   ------------------------------------------------------------------------------------------------------------------ No results for input(s): TSH, T4TOTAL, T3FREE, THYROIDAB in the last 72 hours.  Invalid input(s): FREET3  Cardiac Enzymes No results for input(s): CKMB, TROPONINI, MYOGLOBIN in the last 168 hours.  Invalid input(s): CK ------------------------------------------------------------------------------------------------------------------     Component Value Date/Time   BNP 88.4 06/14/2019 0321    Micro Results Recent Results (from the past 240 hour(s))  SARS Coronavirus 2 by RT PCR (hospital order, performed in [facility] hospital lab) Nasopharyngeal Nasopharyngeal Swab     Status: Abnormal   Collection Time: 06/09/19  3:43 PM   Specimen: Nasopharyngeal Swab  Result Value Ref Range Status   SARS Coronavirus 2 POSITIVE (A) NEGATIVE Final    Comment: RESULT CALLED TO, READ BACK BY AND VERIFIED WITH: RN Arvella Merles [id number] AT 0037 BY CM (NOTE) SARS-CoV-2 target nucleic acids are DETECTED SARS-CoV-2 RNA is generally detectable in upper respiratory specimens  during the acute phase of infection.  Positive results are indicative  of the presence of the identified virus, but do not rule out bacterial infection or co-infection with other pathogens not detected by the test.  Clinical correlation with patient history and  other diagnostic information is necessary to determine patient infection status.  The expected result is negative. Fact Sheet for Patients:   StrictlyIdeas.no  Fact Sheet for Healthcare Providers:   BankingDealers.co.za   This test is not yet approved or cleared by the [place] FDA and  has been authorized for detection and/or diagnosis of SARS-CoV-2 by FDA under an Emergency Use Authorization (EUA).  This EUA will remain in effect (meaning this test  can be used) for the duration of  the  COVID-19 declaration under Section 564(b)(1) of the Act, 21 U.S.C. section 360-bbb-3(b)(1), unless the authorization is terminated or revoked sooner. Performed at [facility], [place] [address] [place], [place] [address]   MRSA PCR Screening     Status: None   Collection Time: 06/11/19 10:23 AM   Specimen: Nasal Mucosa; Nasopharyngeal  Result Value Ref Range Status   MRSA by PCR NEGATIVE NEGATIVE Final    Comment:        The GeneXpert MRSA Assay  (FDA approved for NASAL specimens only), is one component of a comprehensive MRSA colonization surveillance program. It is not intended to diagnose MRSA infection nor to guide or monitor treatment for MRSA infections. Performed at [facility], [place] [address], [place], [place] [address]     Radiology Reports DG Chest 2 View  Result Date: 06/09/2019 CLINICAL DATA:  Shortness of breath for 1 week with lower extremity swelling, initial encounter EXAM: CHEST - 2 VIEW COMPARISON:  05/30/2017 FINDINGS: Cardiac shadow is mildly prominent. Vascular congestion is noted with mild pulmonary edema consistent with CHF. Small right-sided pleural effusion is noted. Some right basilar atelectasis/early infiltrate is also noted. No bony abnormality is noted. IMPRESSION: Changes of CHF with right basilar infiltrate/atelectasis. Electronically Signed   By: Inez Catalina M.D.   On: 06/09/2019 14:50   CT Angio Chest PE W and/or Wo Contrast  Result Date: 06/09/2019 CLINICAL DATA:  History of COVID-19 positivity with chest pain and lower extremity swelling EXAM: CT ANGIOGRAPHY CHEST WITH CONTRAST TECHNIQUE: Multidetector CT imaging of the chest was performed using the standard protocol during bolus administration of intravenous contrast. Multiplanar CT image reconstructions and MIPs were obtained to evaluate the vascular anatomy. CONTRAST:  175m OMNIPAQUE IOHEXOL 350 MG/ML SOLN COMPARISON:  None. FINDINGS: Cardiovascular: Thoracic aorta shows no aneurysmal dilatation. No cardiac enlargement is seen. Pulmonary artery opacification is somewhat limited due to timing of the contrast bolus although no pulmonary embolus is noted. Mediastinum/Nodes: Thoracic inlet is within normal limits. Scattered small hilar and mediastinal lymph nodes are noted likely reactive in nature. The esophagus is within normal limits as visualized. Lungs/Pleura: Moderate right-sided pleural effusion is noted. Mild interstitial edema is seen.  Patchy infiltrate is noted in the bases bilaterally. This may be related to the known COVID-19 infection. No sizable parenchymal nodules are noted. Upper Abdomen: Visualized upper abdomen is within normal limits. Musculoskeletal: Degenerative changes of the thoracic spine are seen. Review of the MIP images confirms the above findings. IMPRESSION: Right-sided pleural effusion and bibasilar patchy opacities which may be related to the underlying clinical history. Mild pulmonary edema. Hilar and mediastinal lymph nodes likely reactive in nature but not significant by size criteria. No evidence of pulmonary embolus. Electronically Signed   By: MInez CatalinaM.D.   On: 06/09/2019 17:54   ECHOCARDIOGRAM COMPLETE  Result Date: 06/10/2019    ECHOCARDIOGRAM REPORT   Patient Name:   Jeffery JIPSONDate of Exam: 06/10/2019 Medical Rec #:  [id number]       Height:       71.0 in Accession #:    [id number]      Weight:       390.0 lb Date of Birth:  [birth year]       BSA:          2.801 m Patient Age:    89years         BP:  126/65 mmHg Patient Gender: M                HR:           82 bpm. Exam Location:  Inpatient Procedure: 2D Echo, Cardiac Doppler and Color Doppler Indications:    CHF-Acute Diastolic  History:        Patient has prior history of Echocardiogram examinations, most                 recent 06/01/2017. Risk Factors:Hypertension. Morbid Obesity,                 Covid-19,OSA (obstructive sleep apnea).  Sonographer:    Alvino Chapel RCS Referring Phys: [phone] JARED M GARDNER  Sonographer Comments: Technically difficult study due to poor echo windows. IMPRESSIONS  1. Left ventricular ejection fraction, by estimation, is 50 to 55%. The left ventricle has low normal function. Left ventricular endocardial border not optimally defined to evaluate regional wall motion. The left ventricular internal cavity size was moderately dilated. Left ventricular diastolic parameters are indeterminate.  2. Right ventricular  systolic function is normal. The right ventricular size is normal. Tricuspid regurgitation signal is inadequate for assessing PA pressure.  3. Left atrial size was mildly dilated.  4. The mitral valve is grossly normal. Trivial mitral valve regurgitation.  5. The aortic valve was not well visualized. Aortic valve regurgitation is not visualized. Mild aortic valve sclerosis is present, with no evidence of aortic valve stenosis.  6. The inferior vena cava is dilated in size with >50% respiratory variability, suggesting right atrial pressure of 8 mmHg. FINDINGS  Left Ventricle: Left ventricular ejection fraction, by estimation, is 50 to 55%. The left ventricle has low normal function. Left ventricular endocardial border not optimally defined to evaluate regional wall motion. Definity contrast agent was given IV  to delineate the left ventricular endocardial borders. The left ventricular internal cavity size was moderately dilated. There is borderline left ventricular hypertrophy. Left ventricular diastolic parameters are indeterminate. Right Ventricle: The right ventricular size is normal. No increase in right ventricular wall thickness. Right ventricular systolic function is normal. Tricuspid regurgitation signal is inadequate for assessing PA pressure. Left Atrium: Left atrial size was mildly dilated. Right Atrium: Right atrial size was normal in size. Pericardium: There is no evidence of pericardial effusion. Mitral Valve: The mitral valve is grossly normal. Trivial mitral valve regurgitation. Tricuspid Valve: The tricuspid valve is grossly normal. Tricuspid valve regurgitation is trivial. Aortic Valve: The aortic valve was not well visualized. Aortic valve regurgitation is not visualized. Mild aortic valve sclerosis is present, with no evidence of aortic valve stenosis. Moderate aortic valve annular calcification. Pulmonic Valve: The pulmonic valve was not well visualized. Pulmonic valve regurgitation is not  visualized. Aorta: The aortic root is normal in size and structure. Venous: The inferior vena cava is dilated in size with greater than 50% respiratory variability, suggesting right atrial pressure of 8 mmHg. IAS/Shunts: No atrial level shunt detected by color flow Doppler.  LEFT VENTRICLE PLAX 2D LVIDd:         6.40 cm  Diastology LVIDs:         4.50 cm  LV e' lateral:   9.14 cm/s LV PW:         1.10 cm  LV E/e' lateral: 14.6 LV IVS:        1.00 cm  LV e' medial:    7.94 cm/s LVOT diam:     2.00 cm  LV E/e' medial:  16.8 LV SV:         93 LV SV Index:   33 LVOT Area:     3.14 cm  RIGHT VENTRICLE RV S prime:     17.10 cm/s TAPSE (M-mode): 3.0 cm LEFT ATRIUM              Index       RIGHT ATRIUM           Index LA diam:        4.70 cm  1.68 cm/m  RA Area:     19.20 cm LA Vol (A2C):   126.0 ml 44.99 ml/m RA Volume:   57.00 ml  20.35 ml/m LA Vol (A4C):   96.7 ml  34.53 ml/m LA Biplane Vol: 110.0 ml 39.28 ml/m  AORTIC VALVE LVOT Vmax:   140.00 cm/s LVOT Vmean:  98.500 cm/s LVOT VTI:    0.295 m  AORTA Ao Root diam: 3.10 cm MITRAL VALVE MV Area (PHT): 3.29 cm     SHUNTS MV Decel Time: 231 msec     Systemic VTI:  0.30 m MV E velocity: 133.00 cm/s  Systemic Diam: 2.00 cm MV A velocity: 76.00 cm/s MV E/A ratio:  1.75 Rozann Lesches MD Electronically signed by Rozann Lesches MD Signature Date/Time: 06/10/2019/7:01:53 PM    Final    VAS [place] LOWER EXTREMITY VENOUS (DVT)  Result Date: 06/10/2019  Lower Venous DVTStudy Indications: Edema, and Elevated D-Dimer, Covid-19.  Limitations: Body habitus and significant edema. Comparison Study: Prior left lower extremity venous duplex from 04/27/16 is                   available for comparison. Performing Technologist: Sharion Dove RVS  Examination Guidelines: A complete evaluation includes B-mode imaging, spectral Doppler, color Doppler, and power Doppler as needed of all accessible portions of each vessel. Bilateral testing is considered an integral part of a complete  examination. Limited examinations for reoccurring indications may be performed as noted. The reflux portion of the exam is performed with the patient in reverse Trendelenburg.  +---------+---------------+---------+-----------+----------+--------------+ RIGHT    CompressibilityPhasicitySpontaneityPropertiesThrombus Aging +---------+---------------+---------+-----------+----------+--------------+ CFV      Full           Yes      Yes                                 +---------+---------------+---------+-----------+----------+--------------+ SFJ      Full                                                        +---------+---------------+---------+-----------+----------+--------------+ FV Prox  Full                                                        +---------+---------------+---------+-----------+----------+--------------+ FV Mid                  Yes      Yes                                 +---------+---------------+---------+-----------+----------+--------------+  FV Distal               Yes      Yes                                 +---------+---------------+---------+-----------+----------+--------------+ PFV                                                   Not visualized +---------+---------------+---------+-----------+----------+--------------+ POP      Full           Yes      Yes                                 +---------+---------------+---------+-----------+----------+--------------+ PTV      Full                                                        +---------+---------------+---------+-----------+----------+--------------+ PERO     Full                                                        +---------+---------------+---------+-----------+----------+--------------+   +---------+---------------+---------+-----------+----------+--------------+ LEFT     CompressibilityPhasicitySpontaneityPropertiesThrombus Aging  +---------+---------------+---------+-----------+----------+--------------+ CFV      Full           Yes      Yes                                 +---------+---------------+---------+-----------+----------+--------------+ FV Prox                                               Not visualized +---------+---------------+---------+-----------+----------+--------------+ FV Mid                                                Not visualized +---------+---------------+---------+-----------+----------+--------------+ FV Distal                                             Not visualized +---------+---------------+---------+-----------+----------+--------------+ PFV                                                   Not visualized +---------+---------------+---------+-----------+----------+--------------+ POP      Full           Yes      Yes                                 +---------+---------------+---------+-----------+----------+--------------+  PTV      Full                                                        +---------+---------------+---------+-----------+----------+--------------+ PERO     Full                                                        +---------+---------------+---------+-----------+----------+--------------+     Summary: RIGHT: - There is no evidence of deep vein thrombosis in the lower extremity. However, portions of this examination were limited- see technologist comments above.  LEFT: - Findings appear essentially unchanged compared to previous examination. - There is no evidence of deep vein thrombosis in the lower extremity. However, portions of this examination were limited- see technologist comments above.  *See table(s) above for measurements and observations. Electronically signed by Ruta Hinds MD on 06/10/2019 at 6:42:32 PM.    Final

## 2019-06-16 LAB — COMPREHENSIVE METABOLIC PANEL
ALT: 36 U/L (ref 0–44)
AST: 22 U/L (ref 15–41)
Albumin: 3.3 g/dL — ABNORMAL LOW (ref 3.5–5.0)
Alkaline Phosphatase: 83 U/L (ref 38–126)
Anion gap: 9 (ref 5–15)
BUN: 20 mg/dL (ref 6–20)
CO2: 31 mmol/L (ref 22–32)
Calcium: 8.9 mg/dL (ref 8.9–10.3)
Chloride: 97 mmol/L — ABNORMAL LOW (ref 98–111)
Creatinine, Ser: 0.75 mg/dL (ref 0.61–1.24)
GFR calc Af Amer: >60 mL/min (ref >60)
GFR calc non Af Amer: >60 mL/min (ref >60)
Glucose, Bld: 78 mg/dL (ref 70–99)
Potassium: 4.3 mmol/L (ref 3.5–5.1)
Sodium: 137 mmol/L (ref 135–145)
Total Bilirubin: 1.2 mg/dL (ref 0.3–1.2)
Total Protein: 7.5 g/dL (ref 6.5–8.1)

## 2019-06-16 LAB — CBC
HCT: 46.7 % (ref 39.0–52.0)
Hemoglobin: 14.3 g/dL (ref 13.0–17.0)
MCH: 29 pg (ref 26.0–34.0)
MCHC: 30.6 g/dL (ref 30.0–36.0)
MCV: 94.7 fL (ref 80.0–100.0)
Platelets: 217 10*3/uL (ref 150–400)
RBC: 4.93 MIL/uL (ref 4.22–5.81)
RDW: 15.9 % — ABNORMAL HIGH (ref 11.5–15.5)
WBC: 12.2 10*3/uL — ABNORMAL HIGH (ref 4.0–10.5)
nRBC: 0 % (ref 0.0–0.2)

## 2019-06-16 MED ORDER — DEXAMETHASONE 6 MG PO TABS: 6.0000 mg | ORAL_TABLET | Freq: Every day | ORAL | 0 refills | Status: DC

## 2019-06-16 MED ORDER — LISINOPRIL 5 MG PO TABS: 5.0000 mg | ORAL_TABLET | Freq: Every day | ORAL | 0 refills | Status: DC

## 2019-06-16 MED ORDER — CARVEDILOL 3.125 MG PO TABS: 3.1250 mg | ORAL_TABLET | Freq: Two times a day (BID) | ORAL | 0 refills | Status: DC

## 2019-06-16 MED ORDER — FUROSEMIDE 40 MG PO TABS
40.0000 mg | ORAL_TABLET | Freq: Every day | ORAL | 0 refills | Status: AC
Start: 2019-06-16 — End: 2019-07-16

## 2019-06-16 MED ORDER — ACETAMINOPHEN 325 MG PO TABS: 650.0000 mg | ORAL_TABLET | Freq: Four times a day (QID) | ORAL | Status: AC | PRN

## 2019-06-16 NOTE — Discharge Instructions (Signed)
Person Under Monitoring Name: Jeffery Simon  Location: [place] [address]   Infection Prevention Recommendations for Individuals Confirmed to have, or Being Evaluated for, 2019 Novel Coronavirus (COVID-19) Infection Who Receive Care at Home  Individuals who are confirmed to have, or are being evaluated for, COVID-19 should follow the prevention steps below until a healthcare provider or local or state health department says they can return to normal activities.  Stay home except to get medical care You should restrict activities outside your home, except for getting medical care. Do not go to work, school, or public areas, and do not use public transportation or taxis.  Call ahead before visiting your doctor Before your medical appointment, call the healthcare provider and tell them that you have, or are being evaluated for, COVID-19 infection. This will help the healthcare provider's office take steps to keep other people from getting infected. Ask your healthcare provider to call the local or state health department.  Monitor your symptoms Seek prompt medical attention if your illness is worsening (e.g., difficulty breathing). Before going to your medical appointment, call the healthcare provider and tell them that you have, or are being evaluated for, COVID-19 infection. Ask your healthcare provider to call the local or state health department.  Wear a facemask You should wear a facemask that covers your nose and mouth when you are in the same room with other people and when you visit a healthcare provider. People who live with or visit you should also wear a facemask while they are in the same room with you.  Separate yourself from other people in your home As much as possible, you should stay in a different room from other people in your home. Also, you should use a separate bathroom, if available.  Avoid sharing household items You should  not share dishes, drinking glasses, cups, eating utensils, towels, bedding, or other items with other people in your home. After using these items, you should wash them thoroughly with soap and water.  Cover your coughs and sneezes Cover your mouth and nose with a tissue when you cough or sneeze, or you can cough or sneeze into your sleeve. Throw used tissues in a lined trash can, and immediately wash your hands with soap and water for at least 20 seconds or use an alcohol-based hand rub.  Wash your [facility] your hands often and thoroughly with soap and water for at least 20 seconds. You can use an alcohol-based hand sanitizer if soap and water are not available and if your hands are not visibly dirty. Avoid touching your eyes, nose, and mouth with unwashed hands.   Prevention Steps for Caregivers and Household Members of Individuals Confirmed to have, or Being Evaluated for, COVID-19 Infection Being Cared for in the Home  If you live with, or provide care at home for, a person confirmed to have, or being evaluated for, COVID-19 infection please follow these guidelines to prevent infection:  Follow healthcare provider's instructions Make sure that you understand and can help the patient follow any healthcare provider instructions for all care.  Provide for the patient's basic needs You should help the patient with basic needs in the home and provide support for getting groceries, prescriptions, and other personal needs.  Monitor the patient's symptoms If they are getting sicker, call his or her medical provider and tell them that the patient has, or is being evaluated for, COVID-19 infection. This will help the healthcare  provider's office take steps to keep other people from getting infected. Ask the healthcare provider to call the local or state health department.  Limit the number of people who have contact with the patient  If possible, have only one caregiver for the  patient.  Other household members should stay in another home or place of residence. If this is not possible, they should stay  in another room, or be separated from the patient as much as possible. Use a separate bathroom, if available.  Restrict visitors who do not have an essential need to be in the home.  Keep older adults, very young children, and other sick people away from the patient Keep older adults, very young children, and those who have compromised immune systems or chronic health conditions away from the patient. This includes people with chronic heart, lung, or kidney conditions, diabetes, and cancer.  Ensure good ventilation Make sure that shared spaces in the home have good air flow, such as from an air conditioner or an opened window, weather permitting.  Wash your hands often  Wash your hands often and thoroughly with soap and water for at least 20 seconds. You can use an alcohol based hand sanitizer if soap and water are not available and if your hands are not visibly dirty.  Avoid touching your eyes, nose, and mouth with unwashed hands.  Use disposable paper towels to dry your hands. If not available, use dedicated cloth towels and replace them when they become wet.  Wear a facemask and gloves  Wear a disposable facemask at all times in the room and gloves when you touch or have contact with the patient's blood, body fluids, and/or secretions or excretions, such as sweat, saliva, sputum, nasal mucus, vomit, urine, or feces.  Ensure the mask fits over your nose and mouth tightly, and do not touch it during use.  Throw out disposable facemasks and gloves after using them. Do not reuse.  Wash your hands immediately after removing your facemask and gloves.  If your personal clothing becomes contaminated, carefully remove clothing and launder. Wash your hands after handling contaminated clothing.  Place all used disposable facemasks, gloves, and other waste in a lined  container before disposing them with other household waste.  Remove gloves and wash your hands immediately after handling these items.  Do not share dishes, glasses, or other household items with the patient  Avoid sharing household items. You should not share dishes, drinking glasses, cups, eating utensils, towels, bedding, or other items with a patient who is confirmed to have, or being evaluated for, COVID-19 infection.  After the person uses these items, you should wash them thoroughly with soap and water.  Wash laundry thoroughly  Immediately remove and wash clothes or bedding that have blood, body fluids, and/or secretions or excretions, such as sweat, saliva, sputum, nasal mucus, vomit, urine, or feces, on them.  Wear gloves when handling laundry from the patient.  Read and follow directions on labels of laundry or clothing items and detergent. In general, wash and dry with the warmest temperatures recommended on the label.  Clean all areas the individual has used often  Clean all touchable surfaces, such as counters, tabletops, doorknobs, bathroom fixtures, toilets, phones, keyboards, tablets, and bedside tables, every day. Also, clean any surfaces that may have blood, body fluids, and/or secretions or excretions on them.  Wear gloves when cleaning surfaces the patient has come in contact with.  Use a diluted bleach solution (e.g., dilute bleach with  1 part bleach and 10 parts water) or a household disinfectant with a label that says EPA-registered for coronaviruses. To make a bleach solution at home, add 1 tablespoon of bleach to 1 quart (4 cups) of water. For a larger supply, add  cup of bleach to 1 gallon (16 cups) of water.  Read labels of cleaning products and follow recommendations provided on product labels. Labels contain instructions for safe and effective use of the cleaning product including precautions you should take when applying the product, such as wearing gloves or  eye protection and making sure you have good ventilation during use of the product.  Remove gloves and wash hands immediately after cleaning.  Monitor yourself for signs and symptoms of illness Caregivers and household members are considered close contacts, should monitor their health, and will be asked to limit movement outside of the home to the extent possible. Follow the monitoring steps for close contacts listed on the symptom monitoring form.   ? If you have additional questions, contact your local health department or call the epidemiologist on call at [phone] (available 24/7). ? This guidance is subject to change. For the most up-to-date guidance from CDC, please refer to their website: YouBlogs.pl Low Sodium Nutrition Therapy  Eating less sodium can help you if you have high blood pressure, heart failure, or kidney or liver disease.   Your body needs a little sodium, but too much sodium can cause your body to hold onto extra water. This extra water will raise your blood pressure and can cause damage to your heart, kidneys, or liver as they are forced to work harder.   Sometimes you can see how the extra fluid affects you because your hands, legs, or belly swell. You may also hold water around your heart and lungs, which makes it hard to breathe.   Even if you take medication for blood pressure or a water pill (diuretic) to remove fluid, it is still important to have less salt in your diet.   Check with your primary care provider before drinking alcohol since it may affect the amount of fluid in your body and how your heart, kidneys, or liver work. Sodium in Food A low-sodium meal plan limits the sodium that you get from food and beverages to 1,500-2,000 milligrams (mg) per day. Salt is the main source of sodium. Read the nutrition label on the package to find out how much sodium is in one serving of a food.   . Select foods with 140 milligrams (mg) of sodium or less per serving.  . You may be able to eat one or two servings of foods with a little more than 140 milligrams (mg) of sodium if you are closely watching how much sodium you eat in a day.  . Check the serving size on the label. The amount of sodium listed on the label shows the amount in one serving of the food. So, if you eat more than one serving, you will get more sodium than the amount listed.  Tips Cutting Back on Sodium . Eat more fresh foods.  . Fresh fruits and vegetables are low in sodium, as well as frozen vegetables and fruits that have no added juices or sauces.  . Fresh meats are lower in sodium than processed meats, such as bacon, sausage, and hotdogs.  . Not all processed foods are unhealthy, but some processed foods may have too much sodium.  . Eat less salt at the table and when cooking. One of  the ingredients in salt is sodium.  . One teaspoon of table salt has 2,300 milligrams of sodium.  . Leave the salt out of recipes for pasta, casseroles, and soups. . Be a smart shopper.  . Food packages that say "Salt-free", sodium-free", "very low sodium," and "low sodium" have less than 140 milligrams of sodium per serving.  . Beware of products identified as "Unsalted," "No Salt Added," "Reduced Sodium," or "Lower Sodium." These items may still be high in sodium. You should always check the nutrition label. . Add flavors to your food without adding sodium.  . Try lemon juice, lime juice, or vinegar.  . Dry or fresh herbs add flavor.  Sharyn Lull a sodium-free seasoning blend or make your own at home. . You can purchase salt-free or sodium-free condiments like barbeque sauce in stores and online. Ask your registered dietitian nutritionist for recommendations and where to find them.  .  Eating in Restaurants . Choose foods carefully when you eat outside your home. Restaurant foods can be very high in sodium. Many restaurants provide  nutrition facts on their menus or their websites. If you cannot find that information, ask your server. Let your server know that you want your food to be cooked without salt and that you would like your salad dressing and sauces to be served on the side.  .   . Foods Recommended . Food Group . Foods Recommended  . Grains . Bread, bagels, rolls without salted tops Homemade bread made with reduced-sodium baking powder Cold cereals, especially shredded wheat and puffed rice Oats, grits, or cream of wheat Pastas, quinoa, and rice Popcorn, pretzels or crackers without salt Corn tortillas  . Protein Foods . Fresh meats and fish; [place] bacon (check the nutrition labels - make sure they are not packaged in a sodium solution) Canned or packed tuna (no more than 4 ounces at 1 serving) Beans and peas Soybeans) and tofu Eggs Nuts or nut butters without salt  . Dairy . Milk or milk powder Plant milks, such as rice and soy Yogurt, including Greek yogurt Small amounts of natural cheese (blocks of cheese) or reduced-sodium cheese can be used in moderation. (Swiss, ricotta, and fresh mozzarella cheese are lower in sodium than the others) Cream Cheese Low sodium cottage cheese  . Vegetables . Fresh and frozen vegetables without added sauces or salt Homemade soups (without salt) Low-sodium, salt-free or sodium-free canned vegetables and soups  . Fruit . Fresh and canned fruits Dried fruits, such as raisins, cranberries, and prunes  . Oils . Tub or liquid margarine, regular or without salt Canola, corn, peanut, olive, safflower, or sunflower oils  . Condiments . Fresh or dried herbs such as basil, bay leaf, dill, mustard (dry), nutmeg, paprika, parsley, rosemary, sage, or thyme.  Low sodium ketchup Vinegar  Lemon or lime juice Pepper, red pepper flakes, and cayenne. Hot sauce contains sodium, but if you use just a drop or two, it will not add up to much.  Salt-free or sodium-free seasoning mixes  and marinades Simple salad dressings: vinegar and oil  .  Marland Kitchen Foods Not Recommended . Food Group . Foods Not Recommended  . Grains . Breads or crackers topped with salt Cereals (hot/cold) with more than 300 mg sodium per serving Biscuits, cornbread, and other "quick" breads prepared with baking soda Pre-packaged bread crumbs Seasoned and packaged rice and pasta mixes Self-rising flours  . Protein Foods . Cured meats: Bacon, ham, sausage, pepperoni and hot dogs Canned meats (chili,  vienna sausage, or sardines) Smoked fish and meats Frozen meals that have more than 600 mg of sodium per serving Egg substitute (with added sodium)  . Dairy . Buttermilk Processed cheese spreads Cottage cheese (1 cup may have over 500 mg of sodium; look for low-sodium.) American or feta cheese Shredded Cheese has more sodium than blocks of cheese String cheese  . Vegetables . Canned vegetables (unless they are salt-free, sodium-free or low sodium) Frozen vegetables with seasoning and sauces Sauerkraut and pickled vegetables Canned or dried soups (unless they are salt-free, sodium-free, or low sodium) [place] fries and onion rings  . Fruit . Dried fruits preserved with additives that have sodium  . Oils . Salted butter or margarine, all types of olives  . Condiments . Salt, sea salt, kosher salt, onion salt, and garlic salt Seasoning mixes with salt Bouillon cubes Ketchup Barbeque sauce and Worcestershire sauce unless low sodium Soy sauce Salsa, pickles, olives, relish Salad dressings: ranch, blue cheese, [place], and [place].  .  . Low Sodium Sample 1-Day Menu  . Breakfast . 1 cup cooked oatmeal  . 1 slice whole wheat bread toast  . 1 tablespoon peanut butter without salt  . 1 banana  . 1 cup 1% milk  . Lunch . Tacos made with: 2 corn tortillas  .  cup black beans, low sodium  .  cup roasted or grilled chicken (without skin)  .  avocado  . Squeeze of lime juice  . 1 cup salad greens  . 1  tablespoon low-sodium salad dressing  .  cup strawberries  . 1 orange  . Afternoon Snack . 1/3 cup grapes  . 6 ounces yogurt  . Evening Meal . 3 ounces herb-baked fish  . 1 baked potato  . 2 teaspoons olive oil  .  cup cooked carrots  . 2 thick slices tomatoes on:  . 2 lettuce leaves  . 1 teaspoon olive oil  . 1 teaspoon balsamic vinegar  . 1 cup 1% milk  . Evening Snack . 1 apple  .  cup almonds without salt  .  Marland Kitchen Low-Sodium Vegetarian (Lacto-Ovo) Sample 1-Day Menu  . Breakfast . 1 cup cooked oatmeal  . 1 slice whole wheat toast  . 1 tablespoon peanut butter without salt  . 1 banana  . 1 cup 1% milk  . Lunch . Tacos made with: 2 corn tortillas  .  cup black beans, low sodium  .  cup roasted or grilled chicken (without skin)  .  avocado  . Squeeze of lime juice  . 1 cup salad greens  . 1 tablespoon low-sodium salad dressing  .  cup strawberries  . 1 orange  . Evening Meal . Stir fry made with:  cup tofu  . 1 cup brown rice  .  cup broccoli  .  cup green beans  .  cup peppers  .  tablespoon peanut oil  . 1 orange  . 1 cup 1% milk  . Evening Snack . 4 strips celery  . 2 tablespoons hummus  . 1 hard-boiled egg  .  Marland Kitchen Low-Sodium Vegan Sample 1-Day Menu  . Breakfast . 1 cup cooked oatmeal  . 1 tablespoon peanut butter without salt  . 1 cup blueberries  . 1 cup soymilk fortified with calcium, vitamin B12, and vitamin D  . Lunch . 1 small whole wheat pita  .  cup cooked lentils  . 2 tablespoons hummus  . 4 carrot sticks  . 1 medium  apple  . 1 cup soymilk fortified with calcium, vitamin B12, and vitamin D  . Evening Meal . Stir fry made with:  cup tofu  . 1 cup brown rice  .  cup broccoli  .  cup green beans  .  cup peppers  .  tablespoon peanut oil  . 1 cup cantaloupe  . Evening Snack . 1 cup soy yogurt  .  cup mixed nuts  . Copyright 2020  Academy of Nutrition and Dietetics. All rights reserved .  Marland Kitchen Sodium Free Flavoring Tips .  Marland Kitchen When  cooking, the following items may be used for flavoring instead of salt or seasonings that contain sodium. . Remember: A little bit of spice goes a long way! Be careful not to overseason. Marland Kitchen Spice Blend Recipe (makes about ? cup) . 5 teaspoons onion powder  . 2 teaspoons garlic powder  . 2 teaspoons paprika  . 2 teaspoon dry mustard  . 1 teaspoon crushed thyme leaves  .  teaspoon white pepper  .  teaspoon celery seed Food Item Flavorings  Beef Basil, bay leaf, caraway, curry, dill, dry mustard, garlic, grape jelly, green pepper, mace, marjoram, mushrooms (fresh), nutmeg, onion or onion powder, parsley, pepper, rosemary, sage  Chicken Basil, cloves, cranberries, mace, mushrooms (fresh), nutmeg, oregano, paprika, parsley, pineapple, saffron, sage, savory, tarragon, thyme, tomato, turmeric  Egg Chervil, curry, dill, dry mustard, garlic or garlic powder, green pepper, jelly, mushrooms (fresh), nutmeg, onion powder, paprika, parsley, rosemary, tarragon, tomato  Fish Basil, bay leaf, chervil, curry, dill, dry mustard, green pepper, lemon juice, marjoram, mushrooms (fresh), paprika, pepper, tarragon, tomato, turmeric  Lamb Cloves, curry, dill, garlic or garlic powder, mace, mint, mint jelly, onion, oregano, parsley, pineapple, rosemary, tarragon, thyme  Pork Applesauce, basil, caraway, chives, cloves, garlic or garlic powder, onion or onion powder, rosemary, thyme  Veal Apricots, basil, bay leaf, currant jelly, curry, ginger, marjoram, mushrooms (fresh), oregano, paprika  Vegetables Basil, dill, garlic or garlic powder, ginger, lemon juice, mace, marjoram, nutmeg, onion or onion powder, tarragon, tomato, sugar or sugar substitute, salt-free salad dressing, vinegar  Desserts Allspice, anise, cinnamon, cloves, ginger, mace, nutmeg, vanilla extract, other extracts   Copyright 2020  Academy of Nutrition and Dietetics. All rights reserved  Corrin Parker, MS, RD, LDN Clinical Dietitian Office  Phone [phone]

## 2019-06-16 NOTE — Progress Notes (Signed)
Ambulated patient in hallway approx 100 feet on Room air.  O2 sat dropped to 85%.  Once seated and able take mask off patient sat increased to 90% on room air.

## 2019-06-16 NOTE — Progress Notes (Signed)
Patient instructed on AVS at this time including:Follow up appointments, Medication changes, and activity at discharge.  PIV removed.  Patient helped to get dressed.   All belongings packed and awaiting wife arrival around  330pm.

## 2019-06-16 NOTE — TOC Transition Note (Signed)
Transition of Care [facility]) - CM/SW Discharge Note   Patient Details  Name: SUMIT TOROSIAN MRN: [id number] Date of Birth: 12-Apr-1965  [facility] CM/SW Contact:  Carles Collet, RN Phone Number: 06/16/2019, 1:12 PM   Clinical Narrative:   Spoke to patient over the phone. He will need home oxygen. Referral placed to [employer] who will bring portable oxygen to room for ride home. He states his wife is able to provide transport. Address verified. No other CM needs identified.     Final next level of care: Home/Self Care Barriers to Discharge: No Barriers Identified   Patient Goals and CMS Choice        Discharge Placement                       Discharge Plan and Services                DME Arranged: Oxygen DME Agency: [facility]) Date DME Agency Contacted: 06/16/19 Time DME Agency Contacted: [phone] Representative spoke with at DME Agency: [facility] ([facility]) Interventions     Readmission Risk Interventions No flowsheet data found.

## 2019-06-16 NOTE — Progress Notes (Signed)
SATURATION QUALIFICATIONS: (This note is used to comply with regulatory documentation for home oxygen)  Patient Saturations on Room Air at Rest = 91%  Patient Saturations on Room Air while Ambulating = 85%  Patient Saturations on 2 Liters of oxygen while Ambulating = 96%  Please briefly explain why patient needs home oxygen:  Desaturation with ambulation

## 2019-06-16 NOTE — Discharge Summary (Signed)
Jeffery Simon, is a 54 y.o. male  DOB 10-Aug-1965  MRN [id number].  Admission date:  06/09/2019  Admitting Physician  Thurnell Lose, MD  Discharge Date:  06/16/2019   Primary MD  Greig Right, MD  Recommendations for primary care physician for things to follow:  -Check CBC, BMP during next visit, and adjust diuresis as needed. - Please do the referral to cardiology as an outpatient. -Please continue counseling about low-salt diet, fluid restrictions, daily weight, and weight loss    Admission Diagnosis  Shortness of breath [R06.02] Acute respiratory failure with hypoxia (HCC) [J96.01] Hypervolemia, unspecified hypervolemia type [E87.70] Acute hypoxemic respiratory failure ([place]) [J96.01] COVID-19 virus infection [U07.1] COVID-19 [U07.1]   Discharge Diagnosis  Shortness of breath [R06.02] Acute respiratory failure with hypoxia (HCC) [J96.01] Hypervolemia, unspecified hypervolemia type [E87.70] Acute hypoxemic respiratory failure ([place]) [J96.01] COVID-19 virus infection [U07.1] COVID-19 [U07.1]   Principal Problem:   Acute respiratory failure with hypoxia (HCC) Active Problems:   Morbid obesity with BMI of 50.0-59.9, adult (HCC)   Acute on chronic diastolic CHF (congestive heart failure) ([place])   COVID-19 virus infection   Acute hypoxemic respiratory failure ([place])      Past Medical History:  Diagnosis Date  . Arthritis    feet and hands  . HTN (hypertension)   . Obesity   . Sleep apnea    cpap    Past Surgical History:  Procedure Laterality Date  . BUNIONECTOMY     03-02-17 Dr. Berenice Primas  . BUNIONECTOMY Right 03/02/2017   Procedure: RIGHT 5TH BUNIONETTE EXCISION;  Surgeon: Dorna Leitz, MD;  Location: [facility];  Service: Orthopedics;  Laterality: Right;  GRAVES NEED 1HR FOR PROCEDURE  . ENDOVENOUS ABLATION SAPHENOUS VEIN W/ LASER Right 04/06/2016   endovenous laser ablation right  greater saphenous vein by Tinnie Gens MD   . I & D EXTREMITY Left 04/25/2016   Procedure: IRRIGATION AND DEBRIDEMENT EXTREMITY;  Surgeon: Rod Can, MD;  Location: [facility];  Service: Orthopedics;  Laterality: Left;       History of present illness and  Hospital Course:     Kindly see H&P for history of present illness and admission details, please review complete Labs, Consult reports and Test reports for all details in brief  HPI  from the history and physical done on the day of admission 06/09/2019  HPI: Jeffery Simon is a 54 y.o. male with medical history significant of HTN, obesity, HFpEF grade 2 diastolic dysfunction in [id number].  Pt stable on 20mg  lasix at home for past 2 years until the past couple of months when he slowly began to have wt gain, worsening peripheral edema, increased SOB, DOE.  Symptoms acutely progressed over past week and now in to ED.  Wife had COVID last month, quarantined during that period.  Pt not had COVID vaccine.   ED Course: COVID positive.  CTA chest reveals both pulm edema and bibasilar infiltrates.  BNP 247.  60mg  IV lasix given in ED, large volume UOP already.  Initial  O2 requirement of 5L, rapidly decreasing.   Hospital Course   Acute Hypoxic Resp. Failure due to Acute Covid 19 Viral Pneumonitis + CHF  - seems to have moderate disease, was treated with IV remdesivir during hospital stay, this was treated with IV steroids, he finished 5 days of IV remdesivir, as well he is currently transitioned to oral Decadron, to finish total of 10 days as an outpatient. -His hypoxia has significantly improved, he is currently room air at rest, but with activity he requires 2 L nasal cannula, oxygen has been arranged on discharge. -Well volume overload due to CHF was contributing to his hypoxia, please see discussion below. - Encouraged the patient to take his incentive spirometry and flutter valve with him, and keep using it.   Acute on  chronic diastolic CHF  -The echo during hospital stay with a preserved EF 55%, and evidence of diastolic CHF, please see 2D echo below, he was treated with IV diuresis during hospital stay, overall he is -26 L during hospital stay, his Lasix has been increased on discharge to 40 mg oral twice daily,. -Please refer to cardiology as an outpatient. -Have discussed with patient about importance of daily weight, low-salt diet and fluid restrictions . -Continue to monitor electrolytes closely . . -Placed on Coreg and ACE inhibitor, echocardiogram noted with preserved EF and no wall motion abnormality.    Morbid obesity BMI 50 changed with OSA.   -CPAP at nighttime at home.  High D Dimer  - leg ultrasound and CTA negative for any clots.  Discharge Condition:   Stable.  Follow UP  Follow-up Information    Greig Right, MD Follow up in 2 week(s).   Specialty: Family Medicine Contact information: [place] [phone]             Discharge Instructions  and  Discharge Medications     Discharge Instructions    Discharge instructions   Complete by: As directed    Follow with Primary MD Greig Right, MD   Get CBC, CMP,  checked  by Primary MD next visit.    Activity: As tolerated with Full fall precautions use walker/cane & assistance as needed   Disposition Home    Diet: Heart Healthy low salt .  For Heart failure patients - Check your Weight same time everyday, if you gain over 2 pounds, or you develop in leg swelling, experience more shortness of breath or chest pain, call your Primary MD immediately. Follow Cardiac Low Salt Diet and 1.5 lit/day fluid restriction.   On your next visit with your primary care physician please Get Medicines reviewed and adjusted.   Please request your Prim.MD to go over all Hospital Tests and Procedure/Radiological results at the follow up, please get all Hospital records sent to your  Prim MD by signing hospital release before you go home.   If you experience worsening of your admission symptoms, develop shortness of breath, life threatening emergency, suicidal or homicidal thoughts you must seek medical attention immediately by calling 911 or calling your MD immediately  if symptoms less severe.  You Must read complete instructions/literature along with all the possible adverse reactions/side effects for all the Medicines you take and that have been prescribed to you. Take any new Medicines after you have completely understood and accpet all the possible adverse reactions/side effects.   Do not drive, operating heavy machinery, perform activities at heights, swimming or participation in water activities or provide  baby sitting services if your were admitted for syncope or siezures until you have seen by Primary MD or a Neurologist and advised to do so again.  Do not drive when taking Pain medications.    Do not take more than prescribed Pain, Sleep and Anxiety Medications  Special Instructions: If you have smoked or chewed Tobacco  in the last 2 yrs please stop smoking, stop any regular Alcohol  and or any Recreational drug use.  Wear Seat belts while driving.   Please note  You were cared for by a hospitalist during your hospital stay. If you have any questions about your discharge medications or the care you received while you were in the hospital after you are discharged, you can call the unit and asked to speak with the hospitalist on call if the hospitalist that took care of you is not available. Once you are discharged, your primary care physician will handle any further medical issues. Please note that NO REFILLS for any discharge medications will be authorized once you are discharged, as it is imperative that you return to your primary care physician (or establish a relationship with a primary care physician if you do not have one) for your aftercare needs so that  they can reassess your need for medications and monitor your lab values.   Increase activity slowly   Complete by: As directed      Allergies as of 06/16/2019   No Known Allergies     Medication List    STOP taking these medications   diclofenac 75 MG EC tablet Commonly known as: VOLTAREN   ibuprofen 200 MG tablet Commonly known as: ADVIL     TAKE these medications   acetaminophen 325 MG tablet Commonly known as: TYLENOL Take 2 tablets (650 mg total) by mouth every 6 (six) hours as needed for mild pain or headache (fever >/= 101).   albuterol (2.5 MG/3ML) 0.083% nebulizer solution Commonly known as: PROVENTIL Take 2.5 mg by nebulization every 6 (six) hours as needed for wheezing or shortness of breath.   carvedilol 3.125 MG tablet Commonly known as: COREG Take 1 tablet (3.125 mg total) by mouth 2 (two) times daily with a meal.   dexamethasone 6 MG tablet Commonly known as: DECADRON Take 1 tablet (6 mg total) by mouth daily. Start taking on: Jun 17, 2019   Fish Oil 1200 MG Caps Take 1,200 mg by mouth daily.   fluticasone 50 MCG/ACT nasal spray Commonly known as: FLONASE Place 1 spray into both nostrils daily.   furosemide 40 MG tablet Commonly known as: Lasix Take 1 tablet (40 mg total) by mouth daily. What changed:   medication strength  how much to take  when to take this   Garlic [id number] MG Tbec Take 2,000 mg by mouth daily.   Glucosamine Chond Double Str Tabs Take 1 tablet by mouth daily.   lisinopril 5 MG tablet Commonly known as: ZESTRIL Take 1 tablet (5 mg total) by mouth daily. Start taking on: Jun 17, 2019 What changed:   medication strength  how much to take   multivitamin with minerals tablet Take 1 tablet by mouth daily.   spironolactone 25 MG tablet Commonly known as: ALDACTONE Take 25 mg by mouth daily.            Durable Medical Equipment  (From admission, onward)         Start     Ordered   06/16/19 1238  For home use  only  DME oxygen  Once    Question Answer Comment  Length of Need 6 Months   Mode or (Route) Nasal cannula   Liters per Minute 2   Frequency Continuous (stationary and portable oxygen unit needed)   Oxygen conserving device Yes   Oxygen delivery system Gas      06/16/19 1237   06/15/19 1134  For home use only DME oxygen  Once    Question Answer Comment  Length of Need 6 Months   Mode or (Route) Nasal cannula   Liters per Minute 2   Frequency Continuous (stationary and portable oxygen unit needed)   Oxygen conserving device Yes   Oxygen delivery system Gas      06/15/19 1134            Diet and Activity recommendation: See Discharge Instructions above   Consults obtained -  None   Major procedures and Radiology Reports - PLEASE review detailed and final reports for all details, in brief -    ECHOCARDIOGRAM REPORT       Patient Name:  FENN BAYS Date of Exam: 06/10/2019  Medical Rec #: [id number]    Height:    71.0 in  Accession #:  [id number]    Weight:    390.0 lb  Date of Birth: 02/06/1965    BSA:     2.801 m  Patient Age:  62 years     BP:      126/65 mmHg  Patient Gender: M        HR:      82 bpm.  Exam Location: Inpatient   Procedure: 2D Echo, Cardiac Doppler and Color Doppler   Indications:  CHF-Acute Diastolic    History:    Patient has prior history of Echocardiogram examinations,  most         recent 06/01/2017. Risk Factors:Hypertension. Morbid  Obesity,         Covid-19,OSA (obstructive sleep apnea).    Sonographer:  Alvino Chapel RCS  Referring Phys: [phone] JARED M GARDNER     Sonographer Comments: Technically difficult study due to poor echo  windows.  IMPRESSIONS    1. Left ventricular ejection fraction, by estimation, is 50 to 55%. The  left ventricle has low normal function. Left ventricular endocardial  border not optimally defined to evaluate  regional wall motion. The left  ventricular internal cavity size was  moderately dilated. Left ventricular diastolic parameters are  indeterminate.  2. Right ventricular systolic function is normal. The right ventricular  size is normal. Tricuspid regurgitation signal is inadequate for assessing  PA pressure.  3. Left atrial size was mildly dilated.  4. The mitral valve is grossly normal. Trivial mitral valve  regurgitation.  5. The aortic valve was not well visualized. Aortic valve regurgitation  is not visualized. Mild aortic valve sclerosis is present, with no  evidence of aortic valve stenosis.  6. The inferior vena cava is dilated in size with >50% respiratory  variability, suggesting right atrial pressure of 8 mmHg.   FINDINGS  Left Ventricle: Left ventricular ejection fraction, by estimation, is 50  to 55%. The left ventricle has low normal function. Left ventricular  endocardial border not optimally defined to evaluate regional wall motion.  Definity contrast agent was given IV  to delineate the left ventricular endocardial borders. The left  ventricular internal cavity size was moderately dilated. There is  borderline left ventricular hypertrophy. Left ventricular diastolic  parameters are indeterminate.   Right Ventricle: The  right ventricular size is normal. No increase in  right ventricular wall thickness. Right ventricular systolic function is  normal. Tricuspid regurgitation signal is inadequate for assessing PA  pressure.   Left Atrium: Left atrial size was mildly dilated.   Right Atrium: Right atrial size was normal in size.   Pericardium: There is no evidence of pericardial effusion.   Mitral Valve: The mitral valve is grossly normal. Trivial mitral valve  regurgitation.   Tricuspid Valve: The tricuspid valve is grossly normal. Tricuspid valve  regurgitation is trivial.   Aortic Valve: The aortic valve was not well visualized. Aortic valve    regurgitation is not visualized. Mild aortic valve sclerosis is present,  with no evidence of aortic valve stenosis. Moderate aortic valve annular  calcification.   Pulmonic Valve: The pulmonic valve was not well visualized. Pulmonic valve  regurgitation is not visualized.   Aorta: The aortic root is normal in size and structure.   Venous: The inferior vena cava is dilated in size with greater than 50%  respiratory variability, suggesting right atrial pressure of 8 mmHg.   IAS/Shunts: No atrial level shunt detected by color flow Doppler.     LEFT VENTRICLE  PLAX 2D  LVIDd:     6.40 cm Diastology  LVIDs:     4.50 cm LV e' lateral:  9.14 cm/s  LV PW:     1.10 cm LV E/e' lateral: 14.6  LV IVS:    1.00 cm LV e' medial:  7.94 cm/s  LVOT diam:   2.00 cm LV E/e' medial: 16.8  LV SV:     93  LV SV Index:  33  LVOT Area:   3.14 cm     RIGHT VENTRICLE  RV S prime:   17.10 cm/s  TAPSE (M-mode): 3.0 cm   LEFT ATRIUM       Index    RIGHT ATRIUM      Index  LA diam:    4.70 cm 1.68 cm/m RA Area:   19.20 cm  LA Vol (A2C):  126.0 ml 44.99 ml/m RA Volume:  57.00 ml 20.35 ml/m  LA Vol (A4C):  96.7 ml 34.53 ml/m  LA Biplane Vol: 110.0 ml 39.28 ml/m  AORTIC VALVE  LVOT Vmax:  140.00 cm/s  LVOT Vmean: 98.500 cm/s  LVOT VTI:  0.295 m    AORTA  Ao Root diam: 3.10 cm   MITRAL VALVE  MV Area (PHT): 3.29 cm   SHUNTS  MV Decel Time: 231 msec   Systemic VTI: 0.30 m  MV E velocity: 133.00 cm/s Systemic Diam: 2.00 cm  MV A velocity: 76.00 cm/s  MV E/A ratio: 1.75   Rozann Lesches MD  Electronically signed by Rozann Lesches MD  Signature Date/Time: 06/10/2019/7:01:53 PM      Final   DG Chest 2 View  Result Date: 06/09/2019 CLINICAL DATA:  Shortness of breath for 1 week with lower extremity swelling, initial encounter EXAM: CHEST - 2 VIEW COMPARISON:  05/30/2017 FINDINGS: Cardiac shadow is  mildly prominent. Vascular congestion is noted with mild pulmonary edema consistent with CHF. Small right-sided pleural effusion is noted. Some right basilar atelectasis/early infiltrate is also noted. No bony abnormality is noted. IMPRESSION: Changes of CHF with right basilar infiltrate/atelectasis. Electronically Signed   By: Inez Catalina M.D.   On: 06/09/2019 14:50   CT Angio Chest PE W and/or Wo Contrast  Result Date: 06/09/2019 CLINICAL DATA:  History of COVID-19 positivity with chest pain and lower extremity swelling EXAM:  CT ANGIOGRAPHY CHEST WITH CONTRAST TECHNIQUE: Multidetector CT imaging of the chest was performed using the standard protocol during bolus administration of intravenous contrast. Multiplanar CT image reconstructions and MIPs were obtained to evaluate the vascular anatomy. CONTRAST:  160mL OMNIPAQUE IOHEXOL 350 MG/ML SOLN COMPARISON:  None. FINDINGS: Cardiovascular: Thoracic aorta shows no aneurysmal dilatation. No cardiac enlargement is seen. Pulmonary artery opacification is somewhat limited due to timing of the contrast bolus although no pulmonary embolus is noted. Mediastinum/Nodes: Thoracic inlet is within normal limits. Scattered small hilar and mediastinal lymph nodes are noted likely reactive in nature. The esophagus is within normal limits as visualized. Lungs/Pleura: Moderate right-sided pleural effusion is noted. Mild interstitial edema is seen. Patchy infiltrate is noted in the bases bilaterally. This may be related to the known COVID-19 infection. No sizable parenchymal nodules are noted. Upper Abdomen: Visualized upper abdomen is within normal limits. Musculoskeletal: Degenerative changes of the thoracic spine are seen. Review of the MIP images confirms the above findings. IMPRESSION: Right-sided pleural effusion and bibasilar patchy opacities which may be related to the underlying clinical history. Mild pulmonary edema. Hilar and mediastinal lymph nodes likely reactive in  nature but not significant by size criteria. No evidence of pulmonary embolus. Electronically Signed   By: Inez Catalina M.D.   On: 06/09/2019 17:54   ECHOCARDIOGRAM COMPLETE  Result Date: 06/10/2019    ECHOCARDIOGRAM REPORT   Patient Name:   AARIAN FLEWELLEN Date of Exam: 06/10/2019 Medical Rec #:  [id number]        Height:       71.0 in Accession #:    [id number]       Weight:       390.0 lb Date of Birth:  05/25/1965        BSA:          2.801 m Patient Age:    19 years         BP:           126/65 mmHg Patient Gender: M                HR:           82 bpm. Exam Location:  Inpatient Procedure: 2D Echo, Cardiac Doppler and Color Doppler Indications:    CHF-Acute Diastolic  History:        Patient has prior history of Echocardiogram examinations, most                 recent 06/01/2017. Risk Factors:Hypertension. Morbid Obesity,                 Covid-19,OSA (obstructive sleep apnea).  Sonographer:    Alvino Chapel RCS Referring Phys: [phone] JARED M GARDNER  Sonographer Comments: Technically difficult study due to poor echo windows. IMPRESSIONS  1. Left ventricular ejection fraction, by estimation, is 50 to 55%. The left ventricle has low normal function. Left ventricular endocardial border not optimally defined to evaluate regional wall motion. The left ventricular internal cavity size was moderately dilated. Left ventricular diastolic parameters are indeterminate.  2. Right ventricular systolic function is normal. The right ventricular size is normal. Tricuspid regurgitation signal is inadequate for assessing PA pressure.  3. Left atrial size was mildly dilated.  4. The mitral valve is grossly normal. Trivial mitral valve regurgitation.  5. The aortic valve was not well visualized. Aortic valve regurgitation is not visualized. Mild aortic valve sclerosis is present, with no evidence of aortic valve stenosis.  6. The inferior  vena cava is dilated in size with >50% respiratory variability, suggesting right atrial  pressure of 8 mmHg. FINDINGS  Left Ventricle: Left ventricular ejection fraction, by estimation, is 50 to 55%. The left ventricle has low normal function. Left ventricular endocardial border not optimally defined to evaluate regional wall motion. Definity contrast agent was given IV  to delineate the left ventricular endocardial borders. The left ventricular internal cavity size was moderately dilated. There is borderline left ventricular hypertrophy. Left ventricular diastolic parameters are indeterminate. Right Ventricle: The right ventricular size is normal. No increase in right ventricular wall thickness. Right ventricular systolic function is normal. Tricuspid regurgitation signal is inadequate for assessing PA pressure. Left Atrium: Left atrial size was mildly dilated. Right Atrium: Right atrial size was normal in size. Pericardium: There is no evidence of pericardial effusion. Mitral Valve: The mitral valve is grossly normal. Trivial mitral valve regurgitation. Tricuspid Valve: The tricuspid valve is grossly normal. Tricuspid valve regurgitation is trivial. Aortic Valve: The aortic valve was not well visualized. Aortic valve regurgitation is not visualized. Mild aortic valve sclerosis is present, with no evidence of aortic valve stenosis. Moderate aortic valve annular calcification. Pulmonic Valve: The pulmonic valve was not well visualized. Pulmonic valve regurgitation is not visualized. Aorta: The aortic root is normal in size and structure. Venous: The inferior vena cava is dilated in size with greater than 50% respiratory variability, suggesting right atrial pressure of 8 mmHg. IAS/Shunts: No atrial level shunt detected by color flow Doppler.  LEFT VENTRICLE PLAX 2D LVIDd:         6.40 cm  Diastology LVIDs:         4.50 cm  LV e' lateral:   9.14 cm/s LV PW:         1.10 cm  LV E/e' lateral: 14.6 LV IVS:        1.00 cm  LV e' medial:    7.94 cm/s LVOT diam:     2.00 cm  LV E/e' medial:  16.8 LV SV:          93 LV SV Index:   33 LVOT Area:     3.14 cm  RIGHT VENTRICLE RV S prime:     17.10 cm/s TAPSE (M-mode): 3.0 cm LEFT ATRIUM              Index       RIGHT ATRIUM           Index LA diam:        4.70 cm  1.68 cm/m  RA Area:     19.20 cm LA Vol (A2C):   126.0 ml 44.99 ml/m RA Volume:   57.00 ml  20.35 ml/m LA Vol (A4C):   96.7 ml  34.53 ml/m LA Biplane Vol: 110.0 ml 39.28 ml/m  AORTIC VALVE LVOT Vmax:   140.00 cm/s LVOT Vmean:  98.500 cm/s LVOT VTI:    0.295 m  AORTA Ao Root diam: 3.10 cm MITRAL VALVE MV Area (PHT): 3.29 cm     SHUNTS MV Decel Time: 231 msec     Systemic VTI:  0.30 m MV E velocity: 133.00 cm/s  Systemic Diam: 2.00 cm MV A velocity: 76.00 cm/s MV E/A ratio:  1.75 Rozann Lesches MD Electronically signed by Rozann Lesches MD Signature Date/Time: 06/10/2019/7:01:53 PM    Final    VAS [place] LOWER EXTREMITY VENOUS (DVT)  Result Date: 06/10/2019  Lower Venous DVTStudy Indications: Edema, and Elevated D-Dimer, Covid-19.  Limitations: Body habitus and significant edema.  Comparison Study: Prior left lower extremity venous duplex from 04/27/16 is                   available for comparison. Performing Technologist: Sharion Dove RVS  Examination Guidelines: A complete evaluation includes B-mode imaging, spectral Doppler, color Doppler, and power Doppler as needed of all accessible portions of each vessel. Bilateral testing is considered an integral part of a complete examination. Limited examinations for reoccurring indications may be performed as noted. The reflux portion of the exam is performed with the patient in reverse Trendelenburg.  +---------+---------------+---------+-----------+----------+--------------+ RIGHT    CompressibilityPhasicitySpontaneityPropertiesThrombus Aging +---------+---------------+---------+-----------+----------+--------------+ CFV      Full           Yes      Yes                                  +---------+---------------+---------+-----------+----------+--------------+ SFJ      Full                                                        +---------+---------------+---------+-----------+----------+--------------+ FV Prox  Full                                                        +---------+---------------+---------+-----------+----------+--------------+ FV Mid                  Yes      Yes                                 +---------+---------------+---------+-----------+----------+--------------+ FV Distal               Yes      Yes                                 +---------+---------------+---------+-----------+----------+--------------+ PFV                                                   Not visualized +---------+---------------+---------+-----------+----------+--------------+ POP      Full           Yes      Yes                                 +---------+---------------+---------+-----------+----------+--------------+ PTV      Full                                                        +---------+---------------+---------+-----------+----------+--------------+ PERO     Full                                                        +---------+---------------+---------+-----------+----------+--------------+   +---------+---------------+---------+-----------+----------+--------------+  LEFT     CompressibilityPhasicitySpontaneityPropertiesThrombus Aging +---------+---------------+---------+-----------+----------+--------------+ CFV      Full           Yes      Yes                                 +---------+---------------+---------+-----------+----------+--------------+ FV Prox                                               Not visualized +---------+---------------+---------+-----------+----------+--------------+ FV Mid                                                Not visualized  +---------+---------------+---------+-----------+----------+--------------+ FV Distal                                             Not visualized +---------+---------------+---------+-----------+----------+--------------+ PFV                                                   Not visualized +---------+---------------+---------+-----------+----------+--------------+ POP      Full           Yes      Yes                                 +---------+---------------+---------+-----------+----------+--------------+ PTV      Full                                                        +---------+---------------+---------+-----------+----------+--------------+ PERO     Full                                                        +---------+---------------+---------+-----------+----------+--------------+     Summary: RIGHT: - There is no evidence of deep vein thrombosis in the lower extremity. However, portions of this examination were limited- see technologist comments above.  LEFT: - Findings appear essentially unchanged compared to previous examination. - There is no evidence of deep vein thrombosis in the lower extremity. However, portions of this examination were limited- see technologist comments above.  *See table(s) above for measurements and observations. Electronically signed by Ruta Hinds MD on 06/10/2019 at 6:42:32 PM.    Final     Micro Results    Recent Results (from the past 240 hour(s))  SARS Coronavirus 2 by RT PCR (hospital order, performed in [facility] hospital lab) Nasopharyngeal Nasopharyngeal Swab     Status: Abnormal   Collection Time: 06/09/19  3:43 PM   Specimen: Nasopharyngeal Swab  Result Value Ref Range  Status   SARS Coronavirus 2 POSITIVE (A) NEGATIVE Final    Comment: RESULT CALLED TO, READ BACK BY AND VERIFIED WITH: RN Arvella Merles [id number] AT [id number] BY CM (NOTE) SARS-CoV-2 target nucleic acids are DETECTED SARS-CoV-2 RNA is generally detectable  in upper respiratory specimens  during the acute phase of infection.  Positive results are indicative  of the presence of the identified virus, but do not rule out bacterial infection or co-infection with other pathogens not detected by the test.  Clinical correlation with patient history and  other diagnostic information is necessary to determine patient infection status.  The expected result is negative. Fact Sheet for Patients:   StrictlyIdeas.no  Fact Sheet for Healthcare Providers:   BankingDealers.co.za   This test is not yet approved or cleared by the [place] FDA and  has been authorized for detection and/or diagnosis of SARS-CoV-2 by FDA under an Emergency Use Authorization (EUA).  This EUA will remain in effect (meaning this test  can be used) for the duration of  the COVID-19 declaration under Section 564(b)(1) of the Act, 21 U.S.C. section 360-bbb-3(b)(1), unless the authorization is terminated or revoked sooner. Performed at [facility], [place] [address] [place], [place] [address]   MRSA PCR Screening     Status: None   Collection Time: 06/11/19 10:23 AM   Specimen: Nasal Mucosa; Nasopharyngeal  Result Value Ref Range Status   MRSA by PCR NEGATIVE NEGATIVE Final    Comment:        The GeneXpert MRSA Assay (FDA approved for NASAL specimens only), is one component of a comprehensive MRSA colonization surveillance program. It is not intended to diagnose MRSA infection nor to guide or monitor treatment for MRSA infections. Performed at [facility], [place] [address], [place], [place] [address]        Today   Subjective:   Abdinasir Loughney today has no headache,no chest abdominal pain,no new weakness tingling or numbness, feels much better wants to go home today.   Objective:   Blood pressure 133/67, pulse 85, temperature 97.8 F (36.6 C), temperature source Oral, resp. rate 11, height 5\' 11"  (1.803  m), weight (!) 169.1 kg, SpO2 93 %.   Intake/Output Summary (Last 24 hours) at 06/16/2019 1327 Last data filed at 06/16/2019 1045 Gross per 24 hour  Intake 1272 ml  Output 3200 ml  Net -1928 ml    Exam Awake Alert, Oriented x 3, No new F.N deficits, Normal affect Symmetrical Chest wall movement, Good air movement bilaterally, CTAB RRR,No Gallops,Rubs or new Murmurs, No Parasternal Heave +ve B.Sounds, Abd Soft, Non tender,  No rebound -guarding or rigidity. No Cyanosis, Clubbing , has minimal edema, No new Rash or bruise  Data Review   CBC w Diff:  Lab Results  Component Value Date   WBC 12.2 (H) 06/16/2019   HGB 14.3 06/16/2019   HCT 46.7 06/16/2019   PLT 217 06/16/2019   LYMPHOPCT 15 06/14/2019   MONOPCT 10 06/14/2019   EOSPCT 1 06/14/2019   BASOPCT 0 06/14/2019    CMP:  Lab Results  Component Value Date   NA 137 06/16/2019   K 4.3 06/16/2019   CL 97 (L) 06/16/2019   CO2 31 06/16/2019   BUN 20 06/16/2019   CREATININE 0.75 06/16/2019   PROT 7.5 06/16/2019   ALBUMIN 3.3 (L) 06/16/2019   BILITOT 1.2 06/16/2019   ALKPHOS 83 06/16/2019   AST 22 06/16/2019   ALT 36 06/16/2019  .  Total Time in preparing paper work, data evaluation and todays exam - 52 minutes  Phillips Climes M.D on 06/16/2019 at 1:27 PM  [facility]   Office  [phone]

## 2019-06-16 NOTE — Plan of Care (Signed)
  Problem: Education: Goal: Knowledge of risk factors and measures for prevention of condition will improve Outcome: Adequate for Discharge   Problem: Coping: Goal: Psychosocial and spiritual needs will be supported Outcome: Adequate for Discharge   Problem: Respiratory: Goal: Will maintain a patent airway Outcome: Adequate for Discharge Goal: Complications related to the disease process, condition or treatment will be avoided or minimized Outcome: Adequate for Discharge   Problem: Education: Goal: Ability to demonstrate management of disease process will improve Outcome: Adequate for Discharge Goal: Ability to verbalize understanding of medication therapies will improve Outcome: Adequate for Discharge Goal: Individualized Educational Video(s) Outcome: Adequate for Discharge   Problem: Activity: Goal: Capacity to carry out activities will improve Outcome: Adequate for Discharge   Problem: Cardiac: Goal: Ability to achieve and maintain adequate cardiopulmonary perfusion will improve Outcome: Adequate for Discharge   Problem: Education: Goal: Knowledge of General Education information will improve Description: Including pain rating scale, medication(s)/side effects and non-pharmacologic comfort measures Outcome: Adequate for Discharge   Problem: Health Behavior/Discharge Planning: Goal: Ability to manage health-related needs will improve Outcome: Adequate for Discharge   Problem: Clinical Measurements: Goal: Ability to maintain clinical measurements within normal limits will improve Outcome: Adequate for Discharge Goal: Will remain free from infection Outcome: Adequate for Discharge Goal: Diagnostic test results will improve Outcome: Adequate for Discharge Goal: Respiratory complications will improve Outcome: Adequate for Discharge Goal: Cardiovascular complication will be avoided Outcome: Adequate for Discharge   Problem: Activity: Goal: Risk for activity  intolerance will decrease Outcome: Adequate for Discharge

## 2019-06-16 NOTE — Progress Notes (Signed)
Patient discharged home at this time.  Taken down to personal car via wheelchair.  O2 delivered and taken home now.  No s/s of distress noted denies pain.

## 2019-07-04 ENCOUNTER — Encounter: Payer: Self-pay | Admitting: Cardiology

## 2019-07-04 ENCOUNTER — Encounter: Payer: Self-pay | Admitting: *Deleted

## 2019-07-09 ENCOUNTER — Ambulatory Visit (INDEPENDENT_AMBULATORY_CARE_PROVIDER_SITE_OTHER): Payer: PRIVATE HEALTH INSURANCE | Admitting: Cardiology

## 2019-07-09 ENCOUNTER — Encounter: Payer: Self-pay | Admitting: Cardiology

## 2019-07-09 ENCOUNTER — Other Ambulatory Visit: Payer: Self-pay

## 2019-07-09 VITALS — BP 119/73 | HR 89 | Ht 68.75 in | Wt 372.4 lb

## 2019-07-09 DIAGNOSIS — I10 Essential (primary) hypertension: Secondary | ICD-10-CM

## 2019-07-09 DIAGNOSIS — E782 Mixed hyperlipidemia: Secondary | ICD-10-CM | POA: Insufficient documentation

## 2019-07-09 DIAGNOSIS — Z8616 Personal history of COVID-19: Secondary | ICD-10-CM

## 2019-07-09 DIAGNOSIS — I5033 Acute on chronic diastolic (congestive) heart failure: Secondary | ICD-10-CM | POA: Diagnosis not present

## 2019-07-09 LAB — BASIC METABOLIC PANEL
BUN/Creatinine Ratio: 16 (ref 9–20)
BUN: 12 mg/dL (ref 6–24)
CO2: 25 mmol/L (ref 20–29)
Calcium: 9 mg/dL (ref 8.7–10.2)
Chloride: 98 mmol/L (ref 96–106)
Creatinine, Ser: 0.73 mg/dL — ABNORMAL LOW (ref 0.76–1.27)
GFR calc Af Amer: 123 mL/min/{1.73_m2} (ref >59)
GFR calc non Af Amer: 107 mL/min/{1.73_m2} (ref >59)
Glucose: 129 mg/dL — ABNORMAL HIGH (ref 65–99)
Potassium: 3.6 mmol/L (ref 3.5–5.2)
Sodium: 139 mmol/L (ref 134–144)

## 2019-07-09 LAB — MAGNESIUM: Magnesium: 2.2 mg/dL (ref 1.6–2.3)

## 2019-07-09 NOTE — Patient Instructions (Signed)
Medication Instructions:  Your physician recommends that you continue on your current medications as directed. Please refer to the Current Medication list given to you today.  *If you need a refill on your cardiac medications before your next appointment, please call your pharmacy*   Lab Work: Your physician recommends that you return for lab work in: TODAY BMP, Mag If you have labs (blood work) drawn today and your tests are completely normal, you will receive your results only by: . MyChart Message (if you have MyChart) OR . A paper copy in the mail If you have any lab test that is abnormal or we need to change your treatment, we will call you to review the results.   Testing/Procedures: None   Follow-Up: At [facility], you and your health needs are our priority.  As part of our continuing mission to provide you with exceptional heart care, we have created designated Provider Care Teams.  These Care Teams include your primary Cardiologist (physician) and Advanced Practice Providers (APPs -  Physician Assistants and Nurse Practitioners) who all work together to provide you with the care you need, when you need it.  We recommend signing up for the patient portal called "MyChart".  Sign up information is provided on this After Visit Summary.  MyChart is used to connect with patients for Virtual Visits (Telemedicine).  Patients are able to view lab/test results, encounter notes, upcoming appointments, etc.  Non-urgent messages can be sent to your provider as well.   To learn more about what you can do with MyChart, go to [email or web address].    Your next appointment:   3 month(s)  The format for your next appointment:   In Person  Provider:   Kardie Tobb, DO   Other Instructions   

## 2019-07-09 NOTE — Progress Notes (Signed)
Cardiology Office Note:    Date:  07/09/2019   ID:  Jeffery Simon, DOB 14-Apr-1965, MRN [id number]  PCP:  Greig Right, MD  Cardiologist:  Berniece Salines, DO  Electrophysiologist:  None   Referring MD: Esperanza Richters, NP   " I am doing well since my discharge"   History of Present Illness:    Jeffery Simon is a 54 y.o. male with a hx of chronic diastolic heart failure, hypertension, OSA on CPAP, morbid obesity patient COVID-19 infection recently discharged [facility] where he was managed for acute hypoxic respiratory failure as well as acute on chronic diastolic heart failure.  Today the patient tells me that he has been doing well since his discharge from the hospital.  He has lost a lot more weight and he noticed that his family in his legs has improved.  Denies any chest pain, shortness of breath, nausea, vomiting.  Past Medical History:  Diagnosis Date  . Abnormal liver function    Fatty liver  . Acute on chronic diastolic CHF (congestive heart failure) ([place]) 06/09/2019  . Acute pulmonary edema (HCC)   . Acute respiratory failure with hypoxia ([place]) 06/09/2019  . Allergies   . Arthritis    feet and hands  . Bunionette of right foot 03/02/2017  . Cellulitis and abscess of left leg 04/25/2016  . Cellulitis of right hand   . Chest pain 05/31/2017  . COVID-19 virus infection 06/09/2019  . HTN (hypertension)   . Hypoxia   . Left leg cellulitis 04/24/2016  . Morbid obesity with BMI of 50.0-59.9, adult ([place]) 04/24/2016  . Obesity   . OSA (obstructive sleep apnea) 05/03/2012   NPSG 01/2012: AHI 62/hr.  On auto setting ONO on cpap and off oxygen 04/2012:  Low sat 55%, over 2 hrs less than or equal to 88% >> will need to stay on oxygen with cpap.    . Primary hypertension 04/24/2016  . Sleep apnea    cpap  . Transaminitis 04/24/2016  . Varicose veins of bilateral lower extremities with other complications 06/28/8935    Past Surgical History:  Procedure Laterality Date  .  BUNIONECTOMY     03-02-17 Dr. Berenice Primas  . BUNIONECTOMY Right 03/02/2017   Procedure: RIGHT 5TH BUNIONETTE EXCISION;  Surgeon: Dorna Leitz, MD;  Location: [facility];  Service: Orthopedics;  Laterality: Right;  GRAVES NEED 1HR FOR PROCEDURE  . ENDOVENOUS ABLATION SAPHENOUS VEIN W/ LASER Right 04/06/2016   endovenous laser ablation right greater saphenous vein by Tinnie Gens MD   . I & D EXTREMITY Left 04/25/2016   Procedure: IRRIGATION AND DEBRIDEMENT EXTREMITY;  Surgeon: Rod Can, MD;  Location: [facility];  Service: Orthopedics;  Laterality: Left;    Current Medications: Current Meds  Medication Sig  . acetaminophen (TYLENOL) 325 MG tablet Take 2 tablets (650 mg total) by mouth every 6 (six) hours as needed for mild pain or headache (fever >/= 101).  Marland Kitchen albuterol (PROVENTIL) (2.5 MG/3ML) 0.083% nebulizer solution Take 2.5 mg by nebulization every 6 (six) hours as needed for wheezing or shortness of breath.  Marland Kitchen albuterol (VENTOLIN HFA) 108 (90 Base) MCG/ACT inhaler Inhale 2 puffs into the lungs every 6 (six) hours as needed for wheezing or shortness of breath.  . carvedilol (COREG) 3.125 MG tablet Take 1 tablet (3.125 mg total) by mouth 2 (two) times daily with a meal.  . furosemide (LASIX) 40 MG tablet Take 1 tablet (40 mg total) by mouth daily.  . Garlic 3428  MG TBEC Take 2,000 mg by mouth daily.  . Misc Natural Products (GLUCOSAMINE CHOND DOUBLE STR) TABS Take 1 tablet by mouth daily.  . Multiple Vitamins-Minerals (MULTIVITAMIN WITH MINERALS) tablet Take 1 tablet by mouth daily.  . Omega-3 Fatty Acids (FISH OIL) 1200 MG CAPS Take 1,200 mg by mouth daily.  . sacubitril-valsartan (ENTRESTO) 24-26 MG Take 1 tablet by mouth daily.     Allergies:   Patient has no known allergies.   Social History   Socioeconomic History  . Marital status: Married    Spouse name: Not on file  . Number of children: Not on file  . Years of education: Not on file  . Highest education level: Not on file    Occupational History  . Occupation: Metallurgist  Tobacco Use  . Smoking status: Never Smoker  . Smokeless tobacco: Never Used  . Tobacco comment: cigars occ  Vaping Use  . Vaping Use: Never used  Substance and Sexual Activity  . Alcohol use: No  . Drug use: No  . Sexual activity: Yes  Other Topics Concern  . Not on file  Social History Narrative  . Not on file   Social Determinants of Health   Financial Resource Strain:   . Difficulty of Paying Living Expenses:   Food Insecurity:   . Worried About Charity fundraiser in the Last Year:   . Arboriculturist in the Last Year:   Transportation Needs:   . Film/video editor (Medical):   Marland Kitchen Lack of Transportation (Non-Medical):   Physical Activity:   . Days of Exercise per Week:   . Minutes of Exercise per Session:   Stress:   . Feeling of Stress :   Social Connections:   . Frequency of Communication with Friends and Family:   . Frequency of Social Gatherings with Friends and Family:   . Attends Religious Services:   . Active Member of Clubs or Organizations:   . Attends Archivist Meetings:   Marland Kitchen Marital Status:      Family History: The patient's family history includes Hypertension in his father and mother; Suicidality in his father.  ROS:   Review of Systems  Constitution: Negative for decreased appetite, fever and weight gain.  HENT: Negative for congestion, ear discharge, hoarse voice and sore throat.   Eyes: Negative for discharge, redness, vision loss in right eye and visual halos.  Cardiovascular: Negative for chest pain, dyspnea on exertion, leg swelling, orthopnea and palpitations.  Respiratory: Negative for cough, hemoptysis, shortness of breath and snoring.   Endocrine: Negative for heat intolerance and polyphagia.  Hematologic/Lymphatic: Negative for bleeding problem. Does not bruise/bleed easily.  Skin: Negative for flushing, nail changes, rash and suspicious lesions.  Musculoskeletal:  Negative for arthritis, joint pain, muscle cramps, myalgias, neck pain and stiffness.  Gastrointestinal: Negative for abdominal pain, bowel incontinence, diarrhea and excessive appetite.  Genitourinary: Negative for decreased libido, genital sores and incomplete emptying.  Neurological: Negative for brief paralysis, focal weakness, headaches and loss of balance.  Psychiatric/Behavioral: Negative for altered mental status, depression and suicidal ideas.  Allergic/Immunologic: Negative for HIV exposure and persistent infections.    EKGs/Labs/Other Studies Reviewed:    The following studies were reviewed today:   EKG:  The ekg ordered today demonstrates   Recent Labs: 06/14/2019: B Natriuretic Peptide 88.4; Magnesium 2.2 06/16/2019: ALT 36; BUN 20; Creatinine, Ser 0.75; Hemoglobin 14.3; Platelets 217; Potassium 4.3; Sodium 137  Recent Lipid Panel    Component  Value Date/Time   CHOL 170 05/31/2017 1531   TRIG 56 05/31/2017 1531   HDL 46 05/31/2017 1531   CHOLHDL 3.7 05/31/2017 1531   VLDL 11 05/31/2017 1531   LDLCALC 113 (H) 05/31/2017 1531    Physical Exam:    VS:  BP 119/73 (BP Location: Left Arm, Patient Position: Sitting, Cuff Size: Large)   Pulse 89   Ht 5' 8.75" (1.746 m)   Wt (!) 372 lb 6.4 oz (168.9 kg)   SpO2 91%   BMI 55.39 kg/m     Wt Readings from Last 3 Encounters:  07/09/19 (!) 372 lb 6.4 oz (168.9 kg)  06/26/19 (!) 370 lb (167.8 kg)  06/16/19 (!) 372 lb 12.8 oz (169.1 kg)     GEN: Well nourished, well developed in no acute distress HEENT: Normal NECK: No JVD; No carotid bruits LYMPHATICS: No lymphadenopathy CARDIAC: S1S2 noted,RRR, no murmurs, rubs, gallops RESPIRATORY:  Clear to auscultation without rales, wheezing or rhonchi  ABDOMEN: Soft, non-tender, non-distended, +bowel sounds, no guarding. EXTREMITIES: No edema, No cyanosis, no clubbing MUSCULOSKELETAL:  No deformity  SKIN: Warm and dry NEUROLOGIC:  Alert and oriented x 3, non-focal PSYCHIATRIC:   Normal affect, good insight  ASSESSMENT:    1. Essential hypertension   2. Acute on chronic diastolic CHF (congestive heart failure) ([place])   3. Mixed hyperlipidemia   4. History of COVID-19    PLAN:    He clinically appears to be euvolemic. He will continue to his current medication regimen.  Blood work today to assess kidney function as well as electrolytes. I plan to add potassium supplement to his medication regimen if his potassium is within normal limits.  His LDL was 135 now continue with diet modification.  Morbid obesity -the patient understands the need to lose weight with diet and exercise. We have discussed specific strategies for this.  The patient is in agreement with the above plan. The patient left the office in stable condition.  The patient will follow up in 3 months or sooner if needed.   Medication Adjustments/Labs and Tests Ordered: Current medicines are reviewed at length with the patient today.  Concerns regarding medicines are outlined above.  Orders Placed This Encounter  Procedures  . Basic Metabolic Panel (BMET)  . Magnesium   No orders of the defined types were placed in this encounter.   Patient Instructions  Medication Instructions:  Your physician recommends that you continue on your current medications as directed. Please refer to the Current Medication list given to you today.  *If you need a refill on your cardiac medications before your next appointment, please call your pharmacy*   Lab Work: Your physician recommends that you return for lab work in: [place] If you have labs (blood work) drawn today and your tests are completely normal, you will receive your results only by: Marland Kitchen MyChart Message (if you have MyChart) OR . A paper copy in the mail If you have any lab test that is abnormal or we need to change your treatment, we will call you to review the results.   Testing/Procedures: None   Follow-Up: At [facility], you  and your health needs are our priority.  As part of our continuing mission to provide you with exceptional heart care, we have created designated Provider Care Teams.  These Care Teams include your primary Cardiologist (physician) and Advanced Practice Providers (APPs -  Physician Assistants and Nurse Practitioners) who all work together to provide you with the  care you need, when you need it.  We recommend signing up for the patient portal called "MyChart".  Sign up information is provided on this After Visit Summary.  MyChart is used to connect with patients for Virtual Visits (Telemedicine).  Patients are able to view lab/test results, encounter notes, upcoming appointments, etc.  Non-urgent messages can be sent to your provider as well.   To learn more about what you can do with MyChart, go to [email or web address].    Your next appointment:   3 month(s)  The format for your next appointment:   In Person  Provider:   Berniece Salines, DO   Other Instructions      Adopting a Healthy Lifestyle.  Know what a healthy weight is for you (roughly BMI <25) and aim to maintain this   Aim for 7+ servings of fruits and vegetables daily   65-80+ fluid ounces of water or unsweet tea for healthy kidneys   Limit to max 1 drink of alcohol per day; avoid smoking/tobacco   Limit animal fats in diet for cholesterol and heart health - choose grass fed whenever available   Avoid highly processed foods, and foods high in saturated/trans fats   Aim for low stress - take time to unwind and care for your mental health   Aim for 150 min of moderate intensity exercise weekly for heart health, and weights twice weekly for bone health   Aim for 7-9 hours of sleep daily   When it comes to diets, agreement about the perfect plan isnt easy to find, even among the experts. Experts at the [employer] developed an idea known as the Healthy Eating Plate. Just imagine a plate divided into  logical, healthy portions.   The emphasis is on diet quality:   Load up on vegetables and fruits - one-half of your plate: Aim for color and variety, and remember that potatoes dont count.   Go for whole grains - one-quarter of your plate: Whole wheat, barley, wheat berries, quinoa, oats, brown rice, and foods made with them. If you want pasta, go with whole wheat pasta.   Protein power - one-quarter of your plate: Fish, chicken, beans, and nuts are all healthy, versatile protein sources. Limit red meat.   The diet, however, does go beyond the plate, offering a few other suggestions.   Use healthy plant oils, such as olive, canola, soy, corn, sunflower and peanut. Check the labels, and avoid partially hydrogenated oil, which have unhealthy trans fats.   If youre thirsty, drink water. Coffee and tea are good in moderation, but skip sugary drinks and limit milk and dairy products to one or two daily servings.   The type of carbohydrate in the diet is more important than the amount. Some sources of carbohydrates, such as vegetables, fruits, whole grains, and beans-are healthier than others.   Finally, stay active  Signed, Berniece Salines, DO  07/09/2019 9:12 AM    [place]

## 2019-07-10 ENCOUNTER — Telehealth: Payer: Self-pay

## 2019-07-10 MED ORDER — POTASSIUM CHLORIDE CRYS ER 20 MEQ PO TBCR: 20.0000 meq | EXTENDED_RELEASE_TABLET | Freq: Every day | ORAL | 3 refills | Status: AC

## 2019-07-10 NOTE — Telephone Encounter (Signed)
Spoke with patient regarding results and recommendation.  Patient verbalizes understanding and is agreeable to plan of care. Advised patient to call back with any issues or concerns.  

## 2019-07-10 NOTE — Telephone Encounter (Signed)
Left message on patients voicemail to please return our call. Order for potassium was already placed.

## 2019-07-10 NOTE — Telephone Encounter (Signed)
       I went in pt's chart to see who had called him this morning. It was Resa Miner, I routed the call to her.

## 2019-07-10 NOTE — Telephone Encounter (Signed)
-----   Message from Berniece Salines, DO sent at 07/09/2019 10:19 PM EDT ----- Blood glucose is slightly elevated other labs normal . Please give the patient KCL 20 meq daily with his lasix.

## 2019-10-16 ENCOUNTER — Other Ambulatory Visit: Payer: Self-pay

## 2019-10-16 ENCOUNTER — Ambulatory Visit (INDEPENDENT_AMBULATORY_CARE_PROVIDER_SITE_OTHER): Payer: PRIVATE HEALTH INSURANCE | Admitting: Cardiology

## 2019-10-16 ENCOUNTER — Encounter: Payer: Self-pay | Admitting: Cardiology

## 2019-10-16 VITALS — BP 130/84 | HR 86 | Ht 68.75 in | Wt 352.0 lb

## 2019-10-16 DIAGNOSIS — Z6841 Body Mass Index (BMI) 40.0 and over, adult: Secondary | ICD-10-CM

## 2019-10-16 DIAGNOSIS — I5032 Chronic diastolic (congestive) heart failure: Secondary | ICD-10-CM

## 2019-10-16 DIAGNOSIS — I10 Essential (primary) hypertension: Secondary | ICD-10-CM

## 2019-10-16 DIAGNOSIS — G4733 Obstructive sleep apnea (adult) (pediatric): Secondary | ICD-10-CM

## 2019-10-16 NOTE — Patient Instructions (Signed)

## 2019-10-16 NOTE — Progress Notes (Signed)
Cardiology Office Note:    Date:  10/16/2019   ID:  Jeffery Simon, DOB 1965/03/29, MRN [id number]  PCP:  Greig Right, MD  Cardiologist:  Berniece Salines, DO  Electrophysiologist:  None   Referring MD: Greig Right, MD   Chief Complaint  Patient presents with  . Follow-up    History of Present Illness:    Jeffery Simon is a 54 y.o. male with a hx of diastolic heart failure, hypertension, recent COVID-19 infection, OSA on CPAP, morbid obesity comes today for follow-up visit July 09, 2019 at that time adjusting his medication.  Today he is here for follow-up visit he tells me that he has been doing well.  Unfortunately due to not being able to afford Entresto this medication was stopped and switched back to lisinopril.  He has also been started on his beta-blocker by his primary care doctor.  He is happy with the way he suggested to his new healthy lifestyle.  He has lost about 20 pounds  Past Medical History:  Diagnosis Date  . Abnormal liver function    Fatty liver  . Acute on chronic diastolic CHF (congestive heart failure) ([place]) 06/09/2019  . Acute pulmonary edema ([facility])   . Acute respiratory failure with hypoxia ([place]) 06/09/2019  . Allergies   . Arthritis    feet and hands  . Bunionette of right foot 03/02/2017  . Cellulitis and abscess of left leg 04/25/2016  . Cellulitis of right hand   . Chest pain 05/31/2017  . COVID-19 virus infection 06/09/2019  . HTN (hypertension)   . Hypoxia   . Left leg cellulitis 04/24/2016  . Morbid obesity with BMI of 50.0-59.9, adult ([place]) 04/24/2016  . Obesity   . OSA (obstructive sleep apnea) 05/03/2012   NPSG 01/2012: AHI 62/hr.  On auto setting ONO on cpap and off oxygen 04/2012:  Low sat 55%, over 2 hrs less than or equal to 88% >> will need to stay on oxygen with cpap.    . Primary hypertension 04/24/2016  . Sleep apnea    cpap  . Transaminitis 04/24/2016  . Varicose veins of bilateral lower extremities with other complications 0/06/2692     Past Surgical History:  Procedure Laterality Date  . BUNIONECTOMY     03-02-17 Dr. Berenice Primas  . BUNIONECTOMY Right 03/02/2017   Procedure: RIGHT 5TH BUNIONETTE EXCISION;  Surgeon: Dorna Leitz, MD;  Location: [facility];  Service: Orthopedics;  Laterality: Right;  GRAVES NEED 1HR FOR PROCEDURE  . ENDOVENOUS ABLATION SAPHENOUS VEIN W/ LASER Right 04/06/2016   endovenous laser ablation right greater saphenous vein by Tinnie Gens MD   . I & D EXTREMITY Left 04/25/2016   Procedure: IRRIGATION AND DEBRIDEMENT EXTREMITY;  Surgeon: Rod Can, MD;  Location: [facility];  Service: Orthopedics;  Laterality: Left;    Current Medications: Current Meds  Medication Sig  . acetaminophen (TYLENOL) 325 MG tablet Take 2 tablets (650 mg total) by mouth every 6 (six) hours as needed for mild pain or headache (fever >/= 101).  . Garlic 8546 MG TBEC Take 2,000 mg by mouth daily.  Marland Kitchen lisinopril (ZESTRIL) 40 MG tablet Take 40 mg by mouth daily.  . Misc Natural Products (GLUCOSAMINE CHOND DOUBLE STR) TABS Take 1 tablet by mouth daily.  . Multiple Vitamins-Minerals (MULTIVITAMIN WITH MINERALS) tablet Take 1 tablet by mouth daily.  . Omega-3 Fatty Acids (FISH OIL) 1200 MG CAPS Take 1,200 mg by mouth daily.  . potassium chloride SA (KLOR-CON) 20 MEQ tablet Take  1 tablet (20 mEq total) by mouth daily.     Allergies:   Patient has no known allergies.   Social History   Socioeconomic History  . Marital status: Married    Spouse name: Not on file  . Number of children: Not on file  . Years of education: Not on file  . Highest education level: Not on file  Occupational History  . Occupation: Metallurgist  Tobacco Use  . Smoking status: Never Smoker  . Smokeless tobacco: Never Used  . Tobacco comment: cigars occ  Vaping Use  . Vaping Use: Never used  Substance and Sexual Activity  . Alcohol use: No  . Drug use: No  . Sexual activity: Yes  Other Topics Concern  . Not on file  Social History Narrative  .  Not on file   Social Determinants of Health   Financial Resource Strain:   . Difficulty of Paying Living Expenses: Not on file  Food Insecurity:   . Worried About Charity fundraiser in the Last Year: Not on file  . Ran Out of Food in the Last Year: Not on file  Transportation Needs:   . Lack of Transportation (Medical): Not on file  . Lack of Transportation (Non-Medical): Not on file  Physical Activity:   . Days of Exercise per Week: Not on file  . Minutes of Exercise per Session: Not on file  Stress:   . Feeling of Stress : Not on file  Social Connections:   . Frequency of Communication with Friends and Family: Not on file  . Frequency of Social Gatherings with Friends and Family: Not on file  . Attends Religious Services: Not on file  . Active Member of Clubs or Organizations: Not on file  . Attends Archivist Meetings: Not on file  . Marital Status: Not on file     Family History: The patient's family history includes Hypertension in his father and mother; Suicidality in his father.  ROS:   Review of Systems  Constitution: Negative for decreased appetite, fever and weight gain.  HENT: Negative for congestion, ear discharge, hoarse voice and sore throat.   Eyes: Negative for discharge, redness, vision loss in right eye and visual halos.  Cardiovascular: Negative for chest pain, dyspnea on exertion, leg swelling, orthopnea and palpitations.  Respiratory: Negative for cough, hemoptysis, shortness of breath and snoring.   Endocrine: Negative for heat intolerance and polyphagia.  Hematologic/Lymphatic: Negative for bleeding problem. Does not bruise/bleed easily.  Skin: Negative for flushing, nail changes, rash and suspicious lesions.  Musculoskeletal: Negative for arthritis, joint pain, muscle cramps, myalgias, neck pain and stiffness.  Gastrointestinal: Negative for abdominal pain, bowel incontinence, diarrhea and excessive appetite.  Genitourinary: Negative for  decreased libido, genital sores and incomplete emptying.  Neurological: Negative for brief paralysis, focal weakness, headaches and loss of balance.  Psychiatric/Behavioral: Negative for altered mental status, depression and suicidal ideas.  Allergic/Immunologic: Negative for HIV exposure and persistent infections.    EKGs/Labs/Other Studies Reviewed:    The following studies were reviewed today:   EKG: None today   Echo IMPRESSIONS  1. Left ventricular ejection fraction, by estimation, is 50 to 55%. The left ventricle has low normal function. Left ventricular endocardial border not optimally defined to evaluate regional wall motion. The left  ventricular internal cavity size was  moderately dilated. Left ventricular diastolic parameters are indeterminate.  2. Right ventricular systolic function is normal. The right ventricular  size is normal. Tricuspid regurgitation signal  is inadequate for assessing PA pressure.  3. Left atrial size was mildly dilated.  4. The mitral valve is grossly normal. Trivial mitral valve regurgitation.  5. The aortic valve was not well visualized. Aortic valve regurgitation is not visualized. Mild aortic valve sclerosis is present, with no evidence of aortic valve stenosis.  6. The inferior vena cava is dilated in size with >50% respiratory variability, suggesting right atrial pressure of 8 mmHg.   Recent Labs: 06/14/2019: B Natriuretic Peptide 88.4 06/16/2019: ALT 36; Hemoglobin 14.3; Platelets 217 07/09/2019: BUN 12; Creatinine, Ser 0.73; Magnesium 2.2; Potassium 3.6; Sodium 139  Recent Lipid Panel    Component Value Date/Time   CHOL 170 05/31/2017 1531   TRIG 56 05/31/2017 1531   HDL 46 05/31/2017 1531   CHOLHDL 3.7 05/31/2017 1531   VLDL 11 05/31/2017 1531   LDLCALC 113 (H) 05/31/2017 1531    Physical Exam:    VS:  BP 130/84 (BP Location: Right Arm, Patient Position: Sitting, Cuff Size: Large)   Pulse 86   Ht 5' 8.75" (1.746 m)   Wt (!)  352 lb (159.7 kg)   SpO2 92%   BMI 52.36 kg/m     Wt Readings from Last 3 Encounters:  10/16/19 (!) 352 lb (159.7 kg)  07/09/19 (!) 372 lb 6.4 oz (168.9 kg)  06/26/19 (!) 370 lb (167.8 kg)     GEN: Well nourished, well developed in no acute distress HEENT: Normal NECK: No JVD; No carotid bruits LYMPHATICS: No lymphadenopathy CARDIAC: S1S2 noted,RRR, no murmurs, rubs, gallops RESPIRATORY:  Clear to auscultation without rales, wheezing or rhonchi  ABDOMEN: Soft, non-tender, non-distended, +bowel sounds, no guarding. EXTREMITIES: No edema, No cyanosis, no clubbing MUSCULOSKELETAL:  No deformity  SKIN: Warm and dry NEUROLOGIC:  Alert and oriented x 3, non-focal PSYCHIATRIC:  Normal affect, good insight  ASSESSMENT:    1. OSA (obstructive sleep apnea)   2. Chronic diastolic heart failure ([place])   3. Essential hypertension   4. Morbid obesity with BMI of 50.0-59.9, adult ([place])    PLAN:     Clinically appears to be doing well from a cardiovascular standpoint.  Did congratulate the patient today as he has lost significant amount of weight.  He tells me he is still active with joint movement.  His blood pressure is acceptable in the office today no changes will be made to his medication regimen.  He will stay on the lisinopril 40 mg daily as well as the Lasix 40 mg daily.  He does get potassium supplement as well.  He tells me he recently had blood work by his PCP which his kidney function electrolytes were all reported to him to be normal.  The patient understands the need to lose weight with diet and exercise. We have discussed specific strategies for this.  OSA continue CPAP.  The patient is in agreement with the above plan. The patient left the office in stable condition.  The patient will follow up in 6 months or sooner if needed.   Medication Adjustments/Labs and Tests Ordered: Current medicines are reviewed at length with the patient today.  Concerns regarding medicines are  outlined above.  No orders of the defined types were placed in this encounter.  No orders of the defined types were placed in this encounter.   Patient Instructions  Medication Instructions:  Your physician recommends that you continue on your current medications as directed. Please refer to the Current Medication list given to you today.  *If you need  a refill on your cardiac medications before your next appointment, please call your pharmacy*   Lab Work: None.  If you have labs (blood work) drawn today and your tests are completely normal, you will receive your results only by: Marland Kitchen MyChart Message (if you have MyChart) OR . A paper copy in the mail If you have any lab test that is abnormal or we need to change your treatment, we will call you to review the results.   Testing/Procedures: None.    Follow-Up: At [facility], you and your health needs are our priority.  As part of our continuing mission to provide you with exceptional heart care, we have created designated Provider Care Teams.  These Care Teams include your primary Cardiologist (physician) and Advanced Practice Providers (APPs -  Physician Assistants and Nurse Practitioners) who all work together to provide you with the care you need, when you need it.  We recommend signing up for the patient portal called "MyChart".  Sign up information is provided on this After Visit Summary.  MyChart is used to connect with patients for Virtual Visits (Telemedicine).  Patients are able to view lab/test results, encounter notes, upcoming appointments, etc.  Non-urgent messages can be sent to your provider as well.   To learn more about what you can do with MyChart, go to [email or web address].    Your next appointment:   6 month(s)  The format for your next appointment:   In Person  Provider:   Berniece Salines, DO   Other Instructions      Adopting a Healthy Lifestyle.  Know what a healthy weight is for you (roughly  BMI <25) and aim to maintain this   Aim for 7+ servings of fruits and vegetables daily   65-80+ fluid ounces of water or unsweet tea for healthy kidneys   Limit to max 1 drink of alcohol per day; avoid smoking/tobacco   Limit animal fats in diet for cholesterol and heart health - choose grass fed whenever available   Avoid highly processed foods, and foods high in saturated/trans fats   Aim for low stress - take time to unwind and care for your mental health   Aim for 150 min of moderate intensity exercise weekly for heart health, and weights twice weekly for bone health   Aim for 7-9 hours of sleep daily   When it comes to diets, agreement about the perfect plan isnt easy to find, even among the experts. Experts at the [employer] developed an idea known as the Healthy Eating Plate. Just imagine a plate divided into logical, healthy portions.   The emphasis is on diet quality:   Load up on vegetables and fruits - one-half of your plate: Aim for color and variety, and remember that potatoes dont count.   Go for whole grains - one-quarter of your plate: Whole wheat, barley, wheat berries, quinoa, oats, brown rice, and foods made with them. If you want pasta, go with whole wheat pasta.   Protein power - one-quarter of your plate: Fish, chicken, beans, and nuts are all healthy, versatile protein sources. Limit red meat.   The diet, however, does go beyond the plate, offering a few other suggestions.   Use healthy plant oils, such as olive, canola, soy, corn, sunflower and peanut. Check the labels, and avoid partially hydrogenated oil, which have unhealthy trans fats.   If youre thirsty, drink water. Coffee and tea are good in moderation, but skip sugary drinks and  limit milk and dairy products to one or two daily servings.   The type of carbohydrate in the diet is more important than the amount. Some sources of carbohydrates, such as vegetables, fruits, whole  grains, and beans-are healthier than others.   Finally, stay active  Signed, Berniece Salines, DO  10/16/2019 8:33 AM    [place]

## 2020-03-25 DIAGNOSIS — M1990 Unspecified osteoarthritis, unspecified site: Secondary | ICD-10-CM | POA: Insufficient documentation

## 2020-03-25 DIAGNOSIS — T7840XA Allergy, unspecified, initial encounter: Secondary | ICD-10-CM | POA: Insufficient documentation

## 2020-03-25 DIAGNOSIS — G4730 Sleep apnea, unspecified: Secondary | ICD-10-CM | POA: Insufficient documentation

## 2020-03-25 DIAGNOSIS — R945 Abnormal results of liver function studies: Secondary | ICD-10-CM | POA: Insufficient documentation

## 2020-03-25 DIAGNOSIS — E669 Obesity, unspecified: Secondary | ICD-10-CM | POA: Insufficient documentation

## 2020-03-25 DIAGNOSIS — I10 Essential (primary) hypertension: Secondary | ICD-10-CM | POA: Insufficient documentation

## 2020-03-31 ENCOUNTER — Other Ambulatory Visit: Payer: Self-pay

## 2020-03-31 ENCOUNTER — Ambulatory Visit (INDEPENDENT_AMBULATORY_CARE_PROVIDER_SITE_OTHER): Payer: PRIVATE HEALTH INSURANCE | Admitting: Cardiology

## 2020-03-31 ENCOUNTER — Encounter: Payer: Self-pay | Admitting: Cardiology

## 2020-03-31 VITALS — BP 140/88 | HR 76 | Ht 71.0 in | Wt 350.0 lb

## 2020-03-31 DIAGNOSIS — E782 Mixed hyperlipidemia: Secondary | ICD-10-CM

## 2020-03-31 DIAGNOSIS — G4733 Obstructive sleep apnea (adult) (pediatric): Secondary | ICD-10-CM

## 2020-03-31 DIAGNOSIS — I10 Essential (primary) hypertension: Secondary | ICD-10-CM

## 2020-03-31 DIAGNOSIS — Z6841 Body Mass Index (BMI) 40.0 and over, adult: Secondary | ICD-10-CM

## 2020-03-31 DIAGNOSIS — C73 Malignant neoplasm of thyroid gland: Secondary | ICD-10-CM

## 2020-03-31 NOTE — Progress Notes (Signed)
Cardiology Office Note:    Date:  03/31/2020   ID:  Jeffery Simon, DOB 1965-02-26, MRN [id number]  PCP:  Greig Right, MD  Cardiologist:  Berniece Salines, DO  Electrophysiologist:  None   Referring MD: Greig Right, MD   "I got some bad news, I was diagnosed with thyroid cancer  History of Present Illness:    Jeffery Simon is a 55 y.o. male with a hx of  diastolic heart failure, hypertension, recent COVID-19 infection, OSA on CPAP, morbid obesity comes today for follow-up visit July 09, 2019 at that time adjusting his medication.  I last saw the patient on October 16, 2019 at that time he had improved and his medications were not changed.  He is here today for follow-up visit.  He has a bad news.  He tells me that he was recently diagnosed with thyroid cancer and he is following an oncologist at [facility].   Past Medical History:  Diagnosis Date  . Abnormal liver function    Fatty liver  . Acute on chronic diastolic CHF (congestive heart failure) ([place]) 06/09/2019  . Acute pulmonary edema (H[facility])   . Acute respiratory failure with hypoxia ([place]) 06/09/2019  . Allergies   . Arthritis    feet and hands  . Bunionette of right foot 03/02/2017  . Cellulitis and abscess of left leg 04/25/2016  . Cellulitis of right hand   . Chest pain 05/31/2017  . COVID-19 virus infection 06/09/2019  . HTN (hypertension)   . Hypoxia   . Left leg cellulitis 04/24/2016  . Morbid obesity with BMI of 50.0-59.9, adult ([place]) 04/24/2016  . Obesity   . OSA (obstructive sleep apnea) 05/03/2012   NPSG 01/2012: AHI 62/hr.  On auto setting ONO on cpap and off oxygen 04/2012:  Low sat 55%, over 2 hrs less than or equal to 88% >> will need to stay on oxygen with cpap.    . Primary hypertension 04/24/2016  . Sleep apnea    cpap  . Transaminitis 04/24/2016  . Varicose veins of bilateral lower extremities with other complications 0/01/930    Past Surgical History:  Procedure Laterality Date  .  BUNIONECTOMY     03-02-17 Dr. Berenice Primas  . BUNIONECTOMY Right 03/02/2017   Procedure: RIGHT 5TH BUNIONETTE EXCISION;  Surgeon: Dorna Leitz, MD;  Location: [facility];  Service: Orthopedics;  Laterality: Right;  GRAVES NEED 1HR FOR PROCEDURE  . ENDOVENOUS ABLATION SAPHENOUS VEIN W/ LASER Right 04/06/2016   endovenous laser ablation right greater saphenous vein by Tinnie Gens MD   . I & D EXTREMITY Left 04/25/2016   Procedure: IRRIGATION AND DEBRIDEMENT EXTREMITY;  Surgeon: Rod Can, MD;  Location: [place];  Service: Orthopedics;  Laterality: Left;    Current Medications: Current Meds  Medication Sig  . acetaminophen (TYLENOL) 325 MG tablet Take 2 tablets (650 mg total) by mouth every 6 (six) hours as needed for mild pain or headache (fever >/= 101).  . Garlic 3557 MG TBEC Take 2,000 mg by mouth daily.  Marland Kitchen lisinopril (ZESTRIL) 40 MG tablet Take 40 mg by mouth daily.  . Misc Natural Products (GLUCOSAMINE CHOND DOUBLE STR) TABS Take 1 tablet by mouth daily.  . Multiple Vitamins-Minerals (MULTIVITAMIN WITH MINERALS) tablet Take 1 tablet by mouth daily.  . Omega-3 Fatty Acids (FISH OIL) 1200 MG CAPS Take 1,200 mg by mouth daily.  . potassium chloride SA (KLOR-CON) 20 MEQ tablet Take 1 tablet (20 mEq total) by mouth daily.  Allergies:   Patient has no known allergies.   Social History   Socioeconomic History  . Marital status: Married    Spouse name: Not on file  . Number of children: Not on file  . Years of education: Not on file  . Highest education level: Not on file  Occupational History  . Occupation: Metallurgist  Tobacco Use  . Smoking status: Never Smoker  . Smokeless tobacco: Never Used  . Tobacco comment: cigars occ  Vaping Use  . Vaping Use: Never used  Substance and Sexual Activity  . Alcohol use: No  . Drug use: No  . Sexual activity: Yes  Other Topics Concern  . Not on file  Social History Narrative  . Not on file   Social Determinants of Health   Financial  Resource Strain: Not on file  Food Insecurity: Not on file  Transportation Needs: Not on file  Physical Activity: Not on file  Stress: Not on file  Social Connections: Not on file     Family History: The patient's family history includes Hypertension in his father and mother; Suicidality in his father.  ROS:   Review of Systems  Constitution: Negative for decreased appetite, fever and weight gain.  HENT: Negative for congestion, ear discharge, hoarse voice and sore throat.   Eyes: Negative for discharge, redness, vision loss in right eye and visual halos.  Cardiovascular: Negative for chest pain, dyspnea on exertion, leg swelling, orthopnea and palpitations.  Respiratory: Negative for cough, hemoptysis, shortness of breath and snoring.   Endocrine: Negative for heat intolerance and polyphagia.  Hematologic/Lymphatic: Negative for bleeding problem. Does not bruise/bleed easily.  Skin: Negative for flushing, nail changes, rash and suspicious lesions.  Musculoskeletal: Negative for arthritis, joint pain, muscle cramps, myalgias, neck pain and stiffness.  Gastrointestinal: Negative for abdominal pain, bowel incontinence, diarrhea and excessive appetite.  Genitourinary: Negative for decreased libido, genital sores and incomplete emptying.  Neurological: Negative for brief paralysis, focal weakness, headaches and loss of balance.  Psychiatric/Behavioral: Negative for altered mental status, depression and suicidal ideas.  Allergic/Immunologic: Negative for HIV exposure and persistent infections.    EKGs/Labs/Other Studies Reviewed:    The following studies were reviewed today:   EKG: None today    Echo IMPRESSIONS  1. Left ventricular ejection fraction, by estimation, is 50 to 55%. The left ventricle has low normal function. Left ventricular endocardial border not optimally defined to evaluate regional wall motion. The left  ventricular internal cavity size was  moderately dilated.  Left ventricular diastolic parameters are indeterminate.  2. Right ventricular systolic function is normal. The right ventricular  size is normal. Tricuspid regurgitation signal is inadequate for assessing PA pressure.  3. Left atrial size was mildly dilated.  4. The mitral valve is grossly normal. Trivial mitral valve regurgitation.  5. The aortic valve was not well visualized. Aortic valve regurgitation is not visualized. Mild aortic valve sclerosis is present, with no evidence of aortic valve stenosis.  6. The inferior vena cava is dilated in size with >50% respiratory variability, suggesting right atrial pressure of 8 mmHg.    Recent Labs: 06/14/2019: B Natriuretic Peptide 88.4 06/16/2019: ALT 36; Hemoglobin 14.3; Platelets 217 07/09/2019: BUN 12; Creatinine, Ser 0.73; Magnesium 2.2; Potassium 3.6; Sodium 139  Recent Lipid Panel    Component Value Date/Time   CHOL 170 05/31/2017 1531   TRIG 56 05/31/2017 1531   HDL 46 05/31/2017 1531   CHOLHDL 3.7 05/31/2017 1531   VLDL 11 05/31/2017 1531  LDLCALC 113 (H) 05/31/2017 1531    Physical Exam:    VS:  BP 140/88   Pulse 76   Ht 5\' 11"  (1.803 m)   Wt (!) 349 lb 15.7 oz (158.7 kg)   SpO2 94%   BMI 48.81 kg/m     Wt Readings from Last 3 Encounters:  03/31/20 (!) 349 lb 15.7 oz (158.7 kg)  10/16/19 (!) 352 lb (159.7 kg)  07/09/19 (!) 372 lb 6.4 oz (168.9 kg)     GEN: Well nourished, well developed in no acute distress HEENT: Normal NECK: No JVD; No carotid bruits LYMPHATICS: No lymphadenopathy CARDIAC: S1S2 noted,RRR, no murmurs, rubs, gallops RESPIRATORY:  Clear to auscultation without rales, wheezing or rhonchi  ABDOMEN: Soft, non-tender, non-distended, +bowel sounds, no guarding. EXTREMITIES: No edema, No cyanosis, no clubbing MUSCULOSKELETAL:  No deformity  SKIN: Warm and dry NEUROLOGIC:  Alert and oriented x 3, non-focal PSYCHIATRIC:  Normal affect, good insight  ASSESSMENT:    1. Hypertension, unspecified  type   2. OSA (obstructive sleep apnea)   3. Morbid obesity with BMI of 50.0-59.9, adult ([place])   4. Mixed hyperlipidemia    PLAN:     From a cardiovascular standpoint he appears to be doing well.  His blood pressures has been acceptable.  No changes will be made to his medication regimen.  His recent lipid profile in December 2021 shows HDL 44, LDL 113, total cholesterol 175, triglyceride 89 we will continue with diet modification.  The patient understands the need to lose weight with diet and exercise. We have discussed specific strategies for this.  He also has been actively working with his diet. He has lost some weight.  Continue with CPAP  He will follow with his oncologist a week for about this.  It is unclear when he was started in chemo therapy or radiation.  Advised the patient to inform me once he gets then will plan for any reassessment at that time.  The patient is in agreement with the above plan. The patient left the office in stable condition.  The patient will follow up in 1 year or sooner   Medication Adjustments/Labs and Tests Ordered: Current medicines are reviewed at length with the patient today.  Concerns regarding medicines are outlined above.  No orders of the defined types were placed in this encounter.  No orders of the defined types were placed in this encounter.   Patient Instructions  Medication Instructions:  Your physician recommends that you continue on your current medications as directed. Please refer to the Current Medication list given to you today.  *If you need a refill on your cardiac medications before your next appointment, please call your pharmacy*   Lab Work: None If you have labs (blood work) drawn today and your tests are completely normal, you will receive your results only by: Marland Kitchen MyChart Message (if you have MyChart) OR . A paper copy in the mail If you have any lab test that is abnormal or we need to change your treatment, we  will call you to review the results.   Testing/Procedures: None   Follow-Up: At [facility], you and your health needs are our priority.  As part of our continuing mission to provide you with exceptional heart care, we have created designated Provider Care Teams.  These Care Teams include your primary Cardiologist (physician) and Advanced Practice Providers (APPs -  Physician Assistants and Nurse Practitioners) who all work together to provide you with the care you  need, when you need it.  We recommend signing up for the patient portal called "MyChart".  Sign up information is provided on this After Visit Summary.  MyChart is used to connect with patients for Virtual Visits (Telemedicine).  Patients are able to view lab/test results, encounter notes, upcoming appointments, etc.  Non-urgent messages can be sent to your provider as well.   To learn more about what you can do with MyChart, go to [email or web address].    Your next appointment:   1 year(s)  The format for your next appointment:   In Person  Provider:   Berniece Salines, DO   Other Instructions      Adopting a Healthy Lifestyle.  Know what a healthy weight is for you (roughly BMI <25) and aim to maintain this   Aim for 7+ servings of fruits and vegetables daily   65-80+ fluid ounces of water or unsweet tea for healthy kidneys   Limit to max 1 drink of alcohol per day; avoid smoking/tobacco   Limit animal fats in diet for cholesterol and heart health - choose grass fed whenever available   Avoid highly processed foods, and foods high in saturated/trans fats   Aim for low stress - take time to unwind and care for your mental health   Aim for 150 min of moderate intensity exercise weekly for heart health, and weights twice weekly for bone health   Aim for 7-9 hours of sleep daily   When it comes to diets, agreement about the perfect plan isnt easy to find, even among the experts. Experts at the [employer] developed an idea known as the Healthy Eating Plate. Just imagine a plate divided into logical, healthy portions.   The emphasis is on diet quality:   Load up on vegetables and fruits - one-half of your plate: Aim for color and variety, and remember that potatoes dont count.   Go for whole grains - one-quarter of your plate: Whole wheat, barley, wheat berries, quinoa, oats, brown rice, and foods made with them. If you want pasta, go with whole wheat pasta.   Protein power - one-quarter of your plate: Fish, chicken, beans, and nuts are all healthy, versatile protein sources. Limit red meat.   The diet, however, does go beyond the plate, offering a few other suggestions.   Use healthy plant oils, such as olive, canola, soy, corn, sunflower and peanut. Check the labels, and avoid partially hydrogenated oil, which have unhealthy trans fats.   If youre thirsty, drink water. Coffee and tea are good in moderation, but skip sugary drinks and limit milk and dairy products to one or two daily servings.   The type of carbohydrate in the diet is more important than the amount. Some sources of carbohydrates, such as vegetables, fruits, whole grains, and beans-are healthier than others.   Finally, stay active  Signed, Berniece Salines, DO  03/31/2020 8:28 AM    [place]

## 2020-03-31 NOTE — Patient Instructions (Signed)

## 2020-05-18 IMAGING — CT CT ANGIO CHEST
2 of 7 series · 18 of 46 positions shown · IV contrast (omnipaque)
Comparison: None.

CLINICAL DATA: History of [id number]-JK positivity with chest pain and
lower extremity swelling

EXAM:
CT ANGIOGRAPHY CHEST WITH CONTRAST
TECHNIQUE: Multidetector CT imaging of the chest was performed using the
standard protocol during bolus administration of intravenous
contrast. Multiplanar CT image reconstructions and MIPs were
obtained to evaluate the vascular anatomy.
CONTRAST:  100mL OMNIPAQUE IOHEXOL 350 MG/ML SOLN

[Series 9: thins · axial · 0.98mm/px · z∈[+1183,+1430]mm · 15 of 279 slices shown]
[im 16/279  lung]
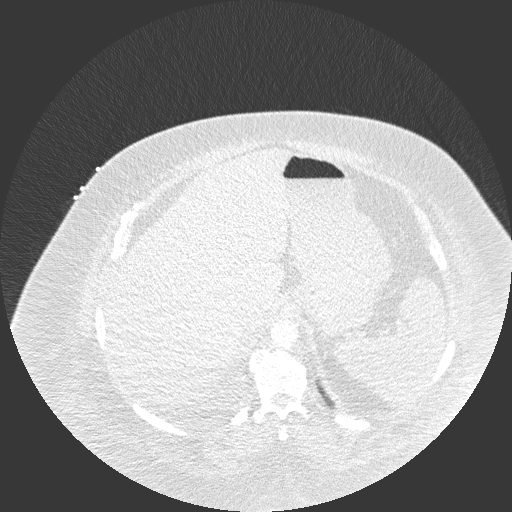
[im 31/279  soft-tissue]
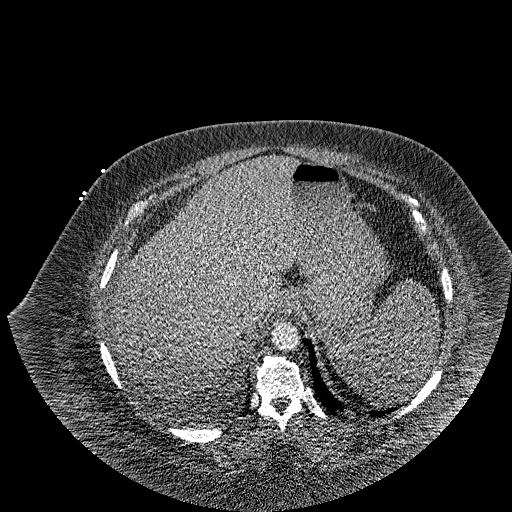
[im 47/279  lung]
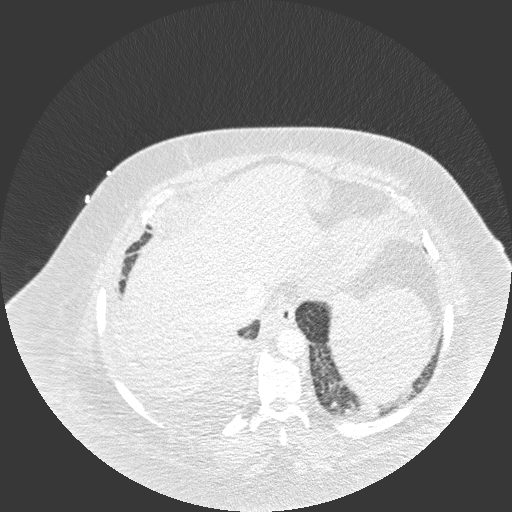
[im 62/279  soft-tissue]
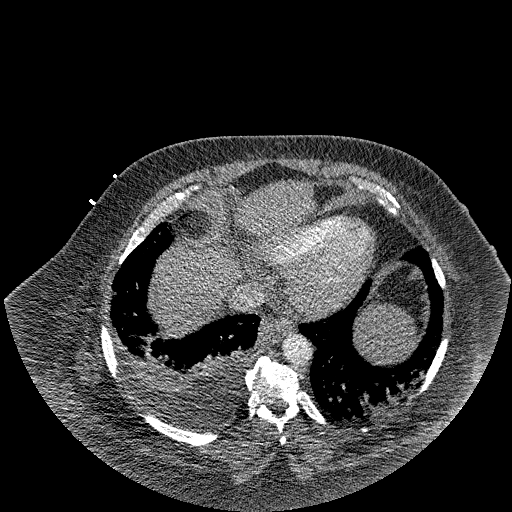
[im 93/279  lung]
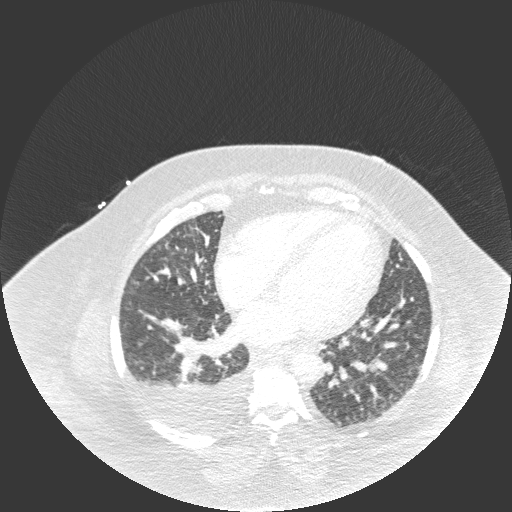
[im 109/279  soft-tissue]
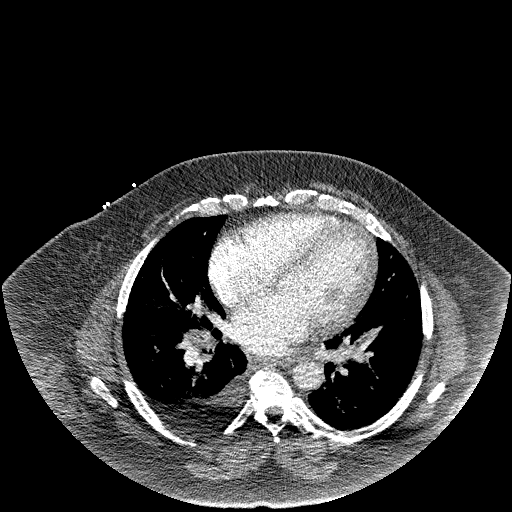
[im 124/279  lung]
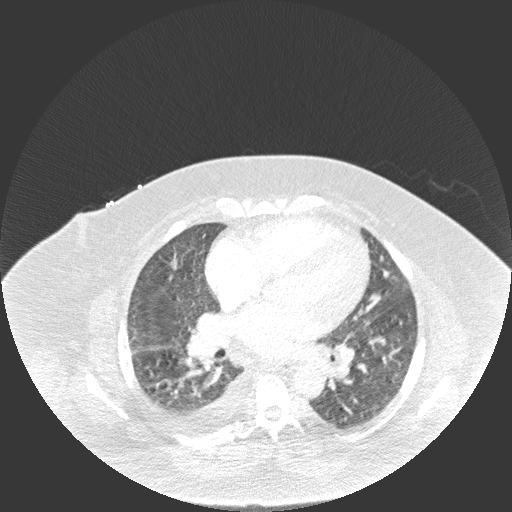
[im 140/279  soft-tissue]
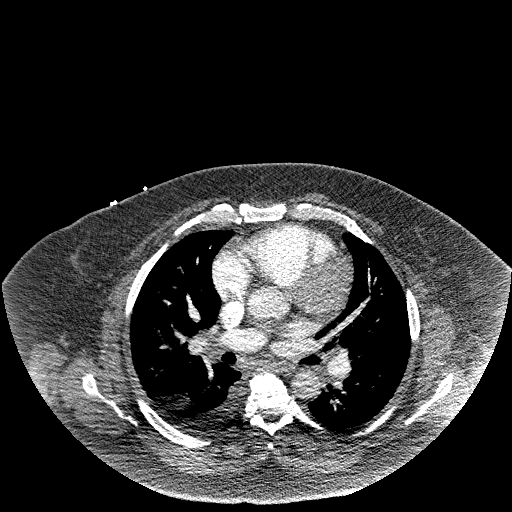
[im 155/279  lung]
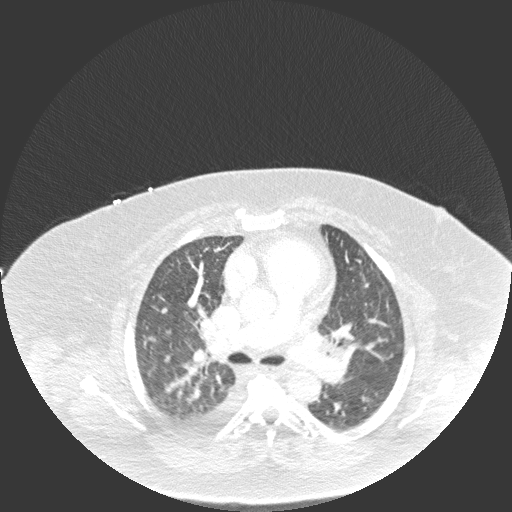
[im 170/279  soft-tissue]
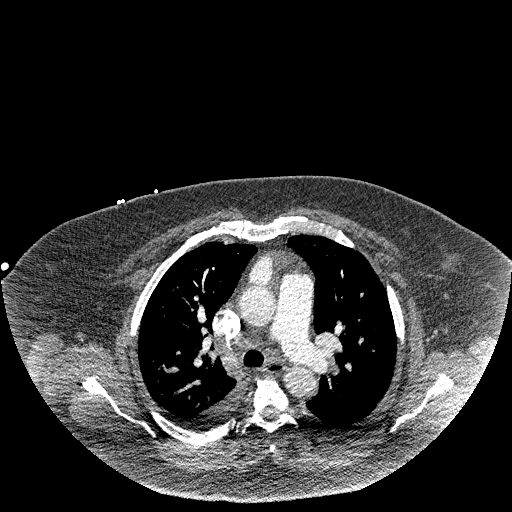
[im 186/279  lung]
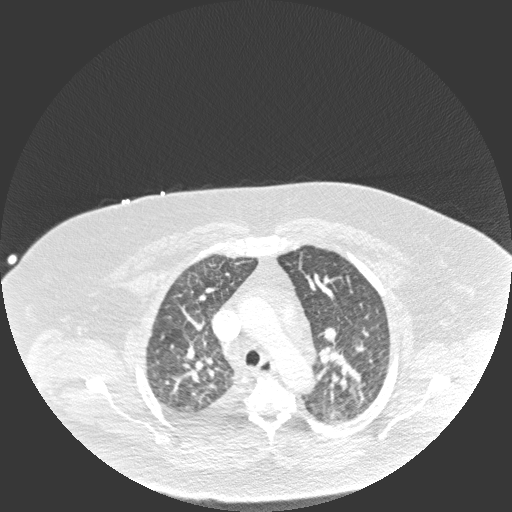
[im 217/279  soft-tissue]
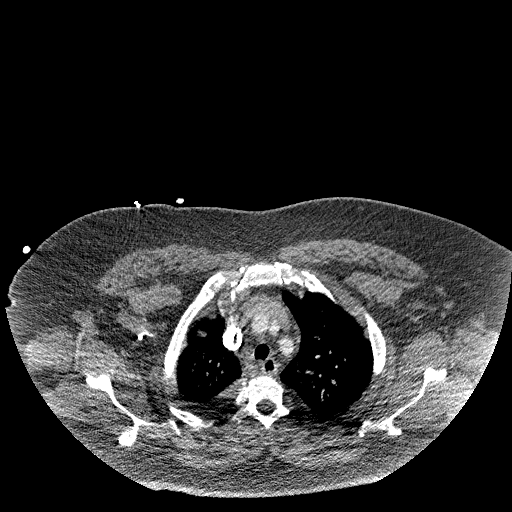
[im 232/279  lung]
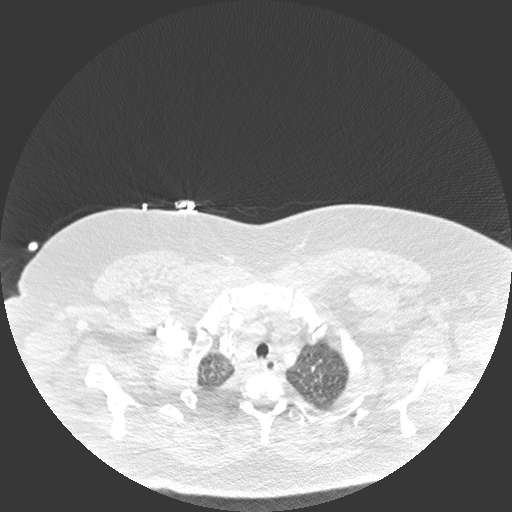
[im 248/279  soft-tissue]
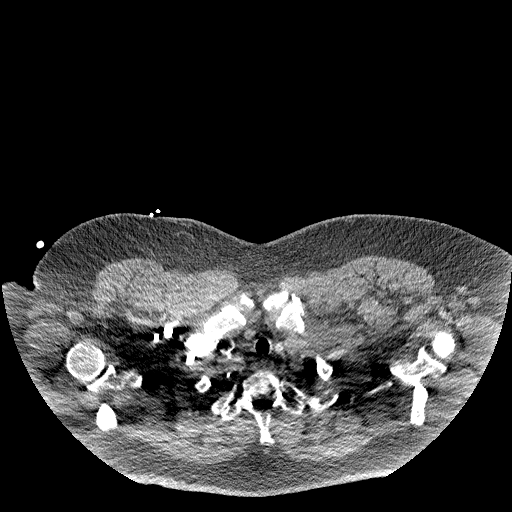
[im 263/279  lung]
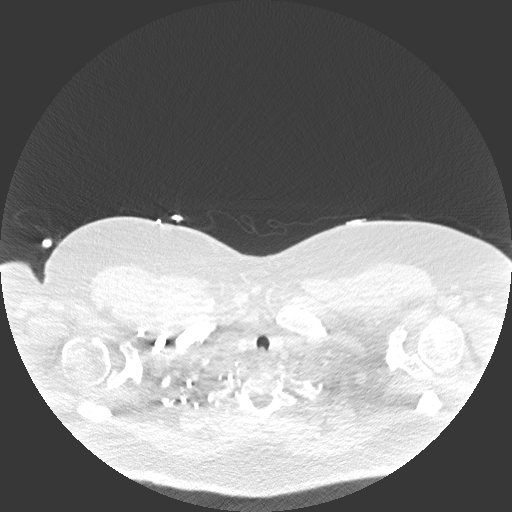

[Series 11: coronal mpr · coronal · 0.62mm/px · 3 of 195 slices shown]
[im 49/195  soft-tissue]
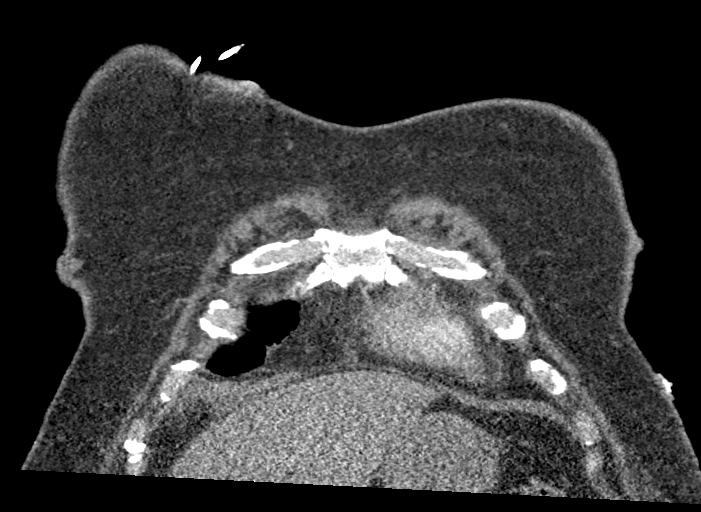
[im 98/195  soft-tissue]
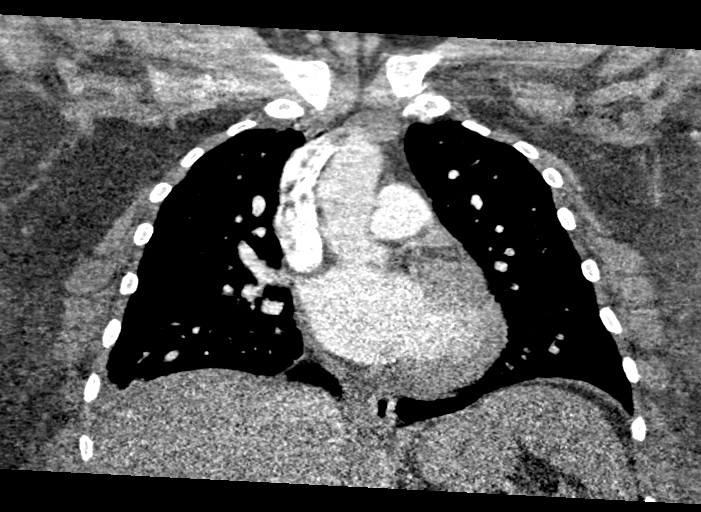
[im 146/195  soft-tissue]
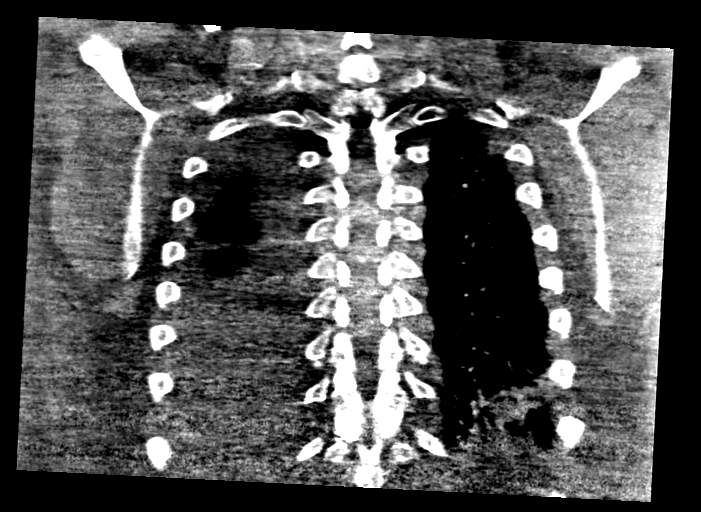

[18 of 46 positions shown; findings below may reference images not displayed]

FINDINGS: Cardiovascular: Thoracic aorta shows no aneurysmal dilatation. No
cardiac enlargement is seen. Pulmonary artery opacification is
somewhat limited due to timing of the contrast bolus although no
pulmonary embolus is noted.

Mediastinum/Nodes: Thoracic inlet is within normal limits. Scattered
small hilar and mediastinal lymph nodes are noted likely reactive in
nature. The esophagus is within normal limits as visualized.

Lungs/Pleura: Moderate right-sided pleural effusion is noted. Mild
interstitial edema is seen. Patchy infiltrate is noted in the bases
bilaterally. This may be related to the known [id number]-JK infection. No
sizable parenchymal nodules are noted.

Upper Abdomen: Visualized upper abdomen is within normal limits.

Musculoskeletal: Degenerative changes of the thoracic spine are
seen.

Review of the MIP images confirms the above findings.
IMPRESSION: Right-sided pleural effusion and bibasilar patchy opacities which
may be related to the underlying clinical history.

Mild pulmonary edema.

Hilar and mediastinal lymph nodes likely reactive in nature but not
significant by size criteria.

No evidence of pulmonary embolus.

## 2021-01-08 ENCOUNTER — Ambulatory Visit: Payer: PRIVATE HEALTH INSURANCE | Attending: Otolaryngology | Admitting: Rehabilitation

## 2021-01-08 ENCOUNTER — Other Ambulatory Visit: Payer: Self-pay

## 2021-01-08 ENCOUNTER — Encounter: Payer: Self-pay | Admitting: Rehabilitation

## 2021-01-08 DIAGNOSIS — Z483 Aftercare following surgery for neoplasm: Secondary | ICD-10-CM | POA: Diagnosis present

## 2021-01-08 DIAGNOSIS — L599 Disorder of the skin and subcutaneous tissue related to radiation, unspecified: Secondary | ICD-10-CM | POA: Diagnosis not present

## 2021-01-08 DIAGNOSIS — I890 Lymphedema, not elsewhere classified: Secondary | ICD-10-CM | POA: Diagnosis present

## 2021-01-08 NOTE — Therapy (Signed)
[facility] @ [address] [place], [place], [address] Phone: [phone]   Fax:  [phone]  Physical Therapy Evaluation  Patient Details  Name: Jeffery Simon MRN: [id number] Date of Birth: [birth year] Referring Provider (PT): Dr. Hendricks Limes   Encounter Date: 01/08/2021   PT End of Session - 01/08/21 0852     Visit Number 1    Number of Visits 7    Date for PT Re-Evaluation 03/05/21    PT Start Time 0806    PT Stop Time 0849    PT Time Calculation (min) 43 min    Activity Tolerance Patient tolerated treatment well    Behavior During Therapy [facility] for tasks assessed/performed             Past Medical History:  Diagnosis Date   Abnormal liver function    Fatty liver   Acute on chronic diastolic CHF (congestive heart failure) ([place]) 06/09/2019   Acute pulmonary edema ([place])    Acute respiratory failure with hypoxia ([place]) 06/09/2019   Allergies    Arthritis    feet and hands   Bunionette of right foot 03/02/2017   Cellulitis and abscess of left leg 04/25/2016   Cellulitis of right hand    Chest pain 05/31/2017   COVID-19 virus infection 06/09/2019   HTN (hypertension)    Hypoxia    Left leg cellulitis 04/24/2016   Morbid obesity with BMI of 50.0-59.9, adult ([place]) 04/24/2016   Obesity    OSA (obstructive sleep apnea) 05/03/2012   NPSG 01/2012: AHI 62/hr.  On auto setting ONO on cpap and off oxygen 04/2012:  Low sat 55%, over 2 hrs less than or equal to 88% >> will need to stay on oxygen with cpap.     Primary hypertension 04/24/2016   Sleep apnea    cpap   Transaminitis 04/24/2016   Varicose veins of bilateral lower extremities with other complications 06/27/8464    Past Surgical History:  Procedure Laterality Date   BUNIONECTOMY     03-02-17 Dr. Rhys Martini Right 03/02/2017   Procedure: RIGHT 5TH BUNIONETTE EXCISION;  Surgeon: Dorna Leitz, MD;  Location: [facility];  Service: Orthopedics;  Laterality: Right;  GRAVES NEED  1HR FOR PROCEDURE   ENDOVENOUS ABLATION SAPHENOUS VEIN W/ LASER Right 04/06/2016   endovenous laser ablation right greater saphenous vein by Tinnie Gens MD    I & D EXTREMITY Left 04/25/2016   Procedure: IRRIGATION AND DEBRIDEMENT EXTREMITY;  Surgeon: Rod Can, MD;  Location: [facility];  Service: Orthopedics;  Laterality: Left;    There were no vitals filed for this visit.    Subjective Assessment - 01/08/21 0807     Subjective I am having some trouble with stiffness mainly and some swelling.  No pattern to the swelling.    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 Completed radiation and chemotherapy 06/20/20 showing complete response but enlarged LN. Neck dissection is a treatment option but will watch nodes for now. Other history includes:  HTN, CHF, AKI    Patient Stated Goals less stiffness and swelling    Currently in Pain? No/denies                [facility] PT Assessment - 01/08/21 0001       Assessment   Medical Diagnosis SCC    Referring Provider (PT) Dr. Hendricks Limes    Onset Date/Surgical Date 06/20/20    Hand Dominance Right  Prior Therapy no      Precautions   Precaution Comments lymphedema anterior neck      Balance Screen   Has the patient fallen in the past 6 months No    Has the patient had a decrease in activity level because of a fear of falling?  No    Is the patient reluctant to leave their home because of a fear of falling?  No      Home [place] residence      Prior Function   Level of Independence Independent    Vocation Full time employment    Vocation Requirements own a bakery    Leisure nothing      Cognition   Overall Cognitive Status Within Functional Limits for tasks assessed      Observation/Other Assessments   Observations some anterior neck edema      ROM / Strength   AROM / PROM / Strength AROM      AROM   Overall AROM Comments shoulder motion WNL reports bil OA in the  shoulders that makes all the movements sore    AROM Assessment Site Shoulder;Cervical    Cervical Flexion 50    Cervical Extension 45   anterior neck tightness   Cervical - Right Side Bend 27    Cervical - Left Side Bend 22    Cervical - Right Rotation 60    Cervical - Left Rotation 52      Palpation   Palpation comment +2 pitting edema anterior neck/sumbental region.  Overall stiffness               LYMPHEDEMA/ONCOLOGY QUESTIONNAIRE - 01/08/21 0001       Type   Cancer Type SCC      Treatment   Active Chemotherapy Treatment No    Past Chemotherapy Treatment Yes    Active Radiation Treatment No    Past Radiation Treatment Yes      What other symptoms do you have   Are you Having Heaviness or Tightness Yes      Lymphedema Assessments   Lymphedema Assessments Head and Neck      Head and Neck   Right Corner of mouth to where ear lobe meets face 10.5 cm    Left Corner of mouth to where ear lobe meets face 10.5 cm    6 cm superior to sternal notch around neck 48 cm   widest neck   Other chin strap widest place: 27cm                     Objective measurements completed on examination: See above findings.       [facility] Adult PT Treatment/Exercise - 01/08/21 0001       Exercises   Exercises Other Exercises    Other Exercises  gave pt pre radiation stretches with demo and performance of each      Manual Therapy   Manual Therapy Edema management;Manual Lymphatic Drainage (MLD)    Edema Management made pt chip pack for home use    Manual Lymphatic Drainage (MLD) education on self MLD: performed in seated per anterior norton approach.  Omitting # 7, 10, 11.  PT instructed and demonstrated each step with pt performing after with cueing as needed.                     PT Education - 01/08/21 0851     Education Details anterior norton  neck MLD, use of chip pack, POC, stretches to start    Person(s) Educated Patient    Methods  Explanation;Demonstration;Tactile cues;Verbal cues;Handout    Comprehension Verbalized understanding;Returned demonstration;Need further instruction                 PT Long Term Goals - 01/08/21 0856       PT LONG TERM GOAL #1   Title Pt will be ind with self MLD    Time 6    Period Weeks    Status New      PT LONG TERM GOAL #2   Title Pt will be ind with stretches post radiation to improve mobility    Time 6    Period Weeks    Status New      PT LONG TERM GOAL #3   Title Pt will be set up with appropriate compression garments to manage lymphedema    Time 6    Period Weeks    Status New                    Plan - 01/08/21 9518     Clinical Impression Statement Pt presents 7 months post chemoXRT for SCC base of tongue with anterior neck tightness, edema, decrease AROM, and radiation fibrosis.  Pt will benefit from PT to learn self MLD, use of garments, self stretches and MT to manage post radiation effects.    Personal Factors and Comorbidities Comorbidity 1    Comorbidities radiation    Stability/Clinical Decision Making Stable/Uncomplicated    Clinical Decision Making Low    Rehab Potential Excellent    PT Frequency 1x / week    PT Duration 6 weeks   starting in new year   PT Treatment/Interventions ADLs/Self Care Home Management;Therapeutic exercise;Manual techniques;Taping;Patient/family education;Manual lymph drainage    PT Next Visit Plan how was self MLD? how was chip pack? remeasure neck - start MLD and review self MLD, decrease tightness anterior neck    Consulted and Agree with Plan of Care Patient             Patient will benefit from skilled therapeutic intervention in order to improve the following deficits and impairments:  Decreased skin integrity, Decreased range of motion  Visit Diagnosis: Disorder of the skin and subcutaneous tissue related to radiation, unspecified  Aftercare following surgery for neoplasm  Lymphedema, not  elsewhere classified     Problem List Patient Active Problem List   Diagnosis Date Noted   Sleep apnea    Obesity    HTN (hypertension)    Arthritis    Allergies    Abnormal liver function    Chronic diastolic heart failure ([place]) 10/16/2019   Essential hypertension 10/16/2019   Mixed hyperlipidemia 07/09/2019   History of COVID-19 07/09/2019   Acute hypoxemic respiratory failure ([place]) 06/10/2019   Acute respiratory failure with hypoxia ([place]) 06/09/2019   Acute on chronic diastolic CHF (congestive heart failure) ([place]) 06/09/2019   COVID-19 virus infection 06/09/2019   Acute pulmonary edema ([facility])    Chest pain 05/31/2017   Hypoxia    Cellulitis of right hand    Bunionette of right foot 03/02/2017   Cellulitis and abscess of left leg 04/25/2016   Left leg cellulitis 04/24/2016   Primary hypertension 04/24/2016   Morbid obesity with BMI of 50.0-59.9, adult ([place]) 04/24/2016   Transaminitis 04/24/2016   Varicose veins of bilateral lower extremities with other complications 84/16/6063   OSA (obstructive sleep apnea) 05/03/2012  Stark Bray, PT 01/08/2021, 8:57 AM  [facility] @ [address] [place], [place], [address] Phone: [phone]   Fax:  [phone]  Name: FLORIAN CHAUCA MRN: [id number] Date of Birth: 02-16-1965

## 2021-01-28 ENCOUNTER — Encounter: Payer: PRIVATE HEALTH INSURANCE | Admitting: Physical Therapy

## 2021-02-04 ENCOUNTER — Encounter: Payer: PRIVATE HEALTH INSURANCE | Admitting: Rehabilitation

## 2021-02-11 ENCOUNTER — Encounter: Payer: PRIVATE HEALTH INSURANCE | Admitting: Rehabilitation

## 2021-02-18 ENCOUNTER — Ambulatory Visit: Payer: PRIVATE HEALTH INSURANCE | Admitting: Rehabilitation

## 2021-02-25 ENCOUNTER — Encounter: Payer: PRIVATE HEALTH INSURANCE | Admitting: Rehabilitation

## 2021-03-04 ENCOUNTER — Encounter: Payer: PRIVATE HEALTH INSURANCE | Admitting: Rehabilitation

## 2023-04-05 DIAGNOSIS — E039 Hypothyroidism, unspecified: Secondary | ICD-10-CM | POA: Diagnosis not present

## 2023-04-05 DIAGNOSIS — K117 Disturbances of salivary secretion: Secondary | ICD-10-CM | POA: Diagnosis not present

## 2023-04-05 DIAGNOSIS — Z8589 Personal history of malignant neoplasm of other organs and systems: Secondary | ICD-10-CM | POA: Diagnosis not present

## 2023-04-05 DIAGNOSIS — Z08 Encounter for follow-up examination after completed treatment for malignant neoplasm: Secondary | ICD-10-CM | POA: Diagnosis not present

## 2023-04-05 DIAGNOSIS — I10 Essential (primary) hypertension: Secondary | ICD-10-CM | POA: Diagnosis not present

## 2023-04-05 DIAGNOSIS — C01 Malignant neoplasm of base of tongue: Secondary | ICD-10-CM | POA: Diagnosis not present

## 2023-05-10 DIAGNOSIS — G4730 Sleep apnea, unspecified: Secondary | ICD-10-CM | POA: Diagnosis not present

## 2023-05-10 DIAGNOSIS — R21 Rash and other nonspecific skin eruption: Secondary | ICD-10-CM | POA: Diagnosis not present

## 2023-05-10 DIAGNOSIS — Z6841 Body Mass Index (BMI) 40.0 and over, adult: Secondary | ICD-10-CM | POA: Diagnosis not present

## 2023-05-10 DIAGNOSIS — I10 Essential (primary) hypertension: Secondary | ICD-10-CM | POA: Diagnosis not present

## 2023-05-10 DIAGNOSIS — Z125 Encounter for screening for malignant neoplasm of prostate: Secondary | ICD-10-CM | POA: Diagnosis not present

## 2023-05-10 DIAGNOSIS — E039 Hypothyroidism, unspecified: Secondary | ICD-10-CM | POA: Diagnosis not present

## 2023-05-23 DIAGNOSIS — R21 Rash and other nonspecific skin eruption: Secondary | ICD-10-CM | POA: Diagnosis not present

## 2023-11-22 DIAGNOSIS — G4730 Sleep apnea, unspecified: Secondary | ICD-10-CM | POA: Diagnosis not present

## 2023-11-22 DIAGNOSIS — I10 Essential (primary) hypertension: Secondary | ICD-10-CM | POA: Diagnosis not present

## 2023-11-22 DIAGNOSIS — R21 Rash and other nonspecific skin eruption: Secondary | ICD-10-CM | POA: Diagnosis not present

## 2023-11-22 DIAGNOSIS — Z2821 Immunization not carried out because of patient refusal: Secondary | ICD-10-CM | POA: Diagnosis not present

## 2023-11-22 DIAGNOSIS — E039 Hypothyroidism, unspecified: Secondary | ICD-10-CM | POA: Diagnosis not present

## 2023-11-22 DIAGNOSIS — Z6841 Body Mass Index (BMI) 40.0 and over, adult: Secondary | ICD-10-CM | POA: Diagnosis not present

## 2023-11-22 DIAGNOSIS — I5032 Chronic diastolic (congestive) heart failure: Secondary | ICD-10-CM | POA: Diagnosis not present
